# Patient Record
Sex: Male | Born: 1975
Health system: Southern US, Community
[De-identification: ages and names within clinical notes are randomized; demographics above are authoritative.]

## PROBLEM LIST (undated history)

## (undated) DIAGNOSIS — H269 Unspecified cataract: Secondary | ICD-10-CM

## (undated) DIAGNOSIS — Z87442 Personal history of urinary calculi: Secondary | ICD-10-CM

## (undated) DIAGNOSIS — R112 Nausea with vomiting, unspecified: Secondary | ICD-10-CM

## (undated) DIAGNOSIS — J309 Allergic rhinitis, unspecified: Secondary | ICD-10-CM

## (undated) DIAGNOSIS — N189 Chronic kidney disease, unspecified: Secondary | ICD-10-CM

## (undated) DIAGNOSIS — T7840XA Allergy, unspecified, initial encounter: Secondary | ICD-10-CM

## (undated) DIAGNOSIS — J45909 Unspecified asthma, uncomplicated: Secondary | ICD-10-CM

## (undated) DIAGNOSIS — N209 Urinary calculus, unspecified: Secondary | ICD-10-CM

## (undated) DIAGNOSIS — F909 Attention-deficit hyperactivity disorder, unspecified type: Secondary | ICD-10-CM

## (undated) DIAGNOSIS — I10 Essential (primary) hypertension: Secondary | ICD-10-CM

## (undated) DIAGNOSIS — Z9889 Other specified postprocedural states: Secondary | ICD-10-CM

## (undated) HISTORY — DX: Allergy, unspecified, initial encounter: T78.40XA

## (undated) HISTORY — PX: TYMPANOSTOMY TUBE PLACEMENT: SHX32

## (undated) HISTORY — DX: Urinary calculus, unspecified: N20.9

## (undated) HISTORY — DX: Attention-deficit hyperactivity disorder, unspecified type: F90.9

## (undated) HISTORY — DX: Other specified postprocedural states: R11.2

## (undated) HISTORY — DX: Allergic rhinitis, unspecified: J30.9

## (undated) HISTORY — PX: TONSILLECTOMY: SHX5217

## (undated) HISTORY — PX: OTHER SURGICAL HISTORY: SHX169

## (undated) HISTORY — DX: Personal history of urinary calculi: Z87.442

## (undated) HISTORY — DX: Unspecified asthma, uncomplicated: J45.909

## (undated) HISTORY — DX: Other specified postprocedural states: Z98.890

## (undated) HISTORY — DX: Unspecified cataract: H26.9

## (undated) HISTORY — DX: Chronic kidney disease, unspecified: N18.9

---

## 2001-12-20 ENCOUNTER — Emergency Department (HOSPITAL_COMMUNITY): Admission: EM | Admit: 2001-12-20 | Discharge: 2001-12-20 | Payer: Self-pay | Admitting: Emergency Medicine

## 2006-01-16 ENCOUNTER — Ambulatory Visit: Payer: Self-pay | Admitting: Internal Medicine

## 2006-02-21 ENCOUNTER — Ambulatory Visit: Payer: Self-pay | Admitting: Internal Medicine

## 2006-03-20 ENCOUNTER — Ambulatory Visit: Payer: Self-pay | Admitting: Internal Medicine

## 2007-09-17 ENCOUNTER — Telehealth: Payer: Self-pay | Admitting: Family Medicine

## 2007-09-17 ENCOUNTER — Ambulatory Visit: Payer: Self-pay | Admitting: Internal Medicine

## 2007-09-17 DIAGNOSIS — J309 Allergic rhinitis, unspecified: Secondary | ICD-10-CM | POA: Insufficient documentation

## 2007-09-17 DIAGNOSIS — M549 Dorsalgia, unspecified: Secondary | ICD-10-CM | POA: Insufficient documentation

## 2007-09-17 DIAGNOSIS — N209 Urinary calculus, unspecified: Secondary | ICD-10-CM | POA: Insufficient documentation

## 2007-09-17 HISTORY — DX: Urinary calculus, unspecified: N20.9

## 2007-09-17 HISTORY — DX: Allergic rhinitis, unspecified: J30.9

## 2007-09-17 LAB — CONVERTED CEMR LAB
Glucose, Urine, Semiquant: NEGATIVE
Nitrite: NEGATIVE
Specific Gravity, Urine: >=1.03
Urobilinogen, UA: 0.2
WBC Urine, dipstick: NEGATIVE
pH: 5.5

## 2007-09-22 ENCOUNTER — Encounter: Payer: Self-pay | Admitting: Internal Medicine

## 2007-10-20 ENCOUNTER — Ambulatory Visit: Payer: Self-pay | Admitting: Internal Medicine

## 2007-10-20 DIAGNOSIS — Z87442 Personal history of urinary calculi: Secondary | ICD-10-CM

## 2007-10-20 HISTORY — DX: Personal history of urinary calculi: Z87.442

## 2007-10-20 LAB — CONVERTED CEMR LAB
ALT: 32 units/L (ref 0–53)
AST: 27 units/L (ref 0–37)
Albumin: 4.7 g/dL (ref 3.5–5.2)
Alkaline Phosphatase: 58 units/L (ref 39–117)
BUN: 13 mg/dL (ref 6–23)
Bilirubin, Direct: 0.1 mg/dL (ref 0.0–0.3)
CO2: 32 meq/L (ref 19–32)
Calcium: 10 mg/dL (ref 8.4–10.5)
Chloride: 104 meq/L (ref 96–112)
Creatinine, Ser: 1.1 mg/dL (ref 0.4–1.5)
GFR calc Af Amer: 99 mL/min
GFR calc non Af Amer: 81 mL/min
Glucose, Bld: 96 mg/dL (ref 70–99)
Potassium: 4.5 meq/L (ref 3.5–5.1)
Sodium: 143 meq/L (ref 135–145)
Total Bilirubin: 0.8 mg/dL (ref 0.3–1.2)
Total Protein: 7.7 g/dL (ref 6.0–8.3)

## 2007-11-13 ENCOUNTER — Ambulatory Visit: Payer: Self-pay | Admitting: Internal Medicine

## 2007-11-13 LAB — CONVERTED CEMR LAB
Cholesterol: 189 mg/dL (ref 0–200)
HDL: 31 mg/dL — ABNORMAL LOW (ref >39.0)
LDL Cholesterol: 130 mg/dL — ABNORMAL HIGH (ref 0–99)
Total CHOL/HDL Ratio: 6.1
Triglycerides: 138 mg/dL (ref 0–149)
VLDL: 28 mg/dL (ref 0–40)

## 2008-01-11 ENCOUNTER — Telehealth (INDEPENDENT_AMBULATORY_CARE_PROVIDER_SITE_OTHER): Payer: Self-pay | Admitting: *Deleted

## 2008-01-27 ENCOUNTER — Encounter: Payer: Self-pay | Admitting: Internal Medicine

## 2008-06-27 ENCOUNTER — Telehealth: Payer: Self-pay | Admitting: Family Medicine

## 2008-06-27 ENCOUNTER — Telehealth: Payer: Self-pay | Admitting: Internal Medicine

## 2008-08-16 ENCOUNTER — Ambulatory Visit: Payer: Self-pay | Admitting: Family Medicine

## 2009-05-16 ENCOUNTER — Ambulatory Visit: Payer: Self-pay | Admitting: Internal Medicine

## 2009-05-18 ENCOUNTER — Telehealth: Payer: Self-pay | Admitting: Internal Medicine

## 2009-05-19 ENCOUNTER — Telehealth: Payer: Self-pay | Admitting: Internal Medicine

## 2009-05-19 ENCOUNTER — Ambulatory Visit: Payer: Self-pay | Admitting: Internal Medicine

## 2010-02-13 NOTE — Progress Notes (Signed)
Summary: Wants referral to Urologist  Phone Note Call from Patient Call back at Home Phone [phone]   Caller: Patient Call For: [name]  MD Summary of Call: Adam Terry to Urgent Care on the 24th for kidney pain.  Had a CT scan and he had a kidney stone.  The urgent care gave him antibiotics and by the 26th he was not having anymore pain.  He thinks the kidney stone has passed but wants to be referred to a urologist since it is a recurring thing.  Can he be referred to an urologist for reoccurring kidney stones? Initial call taken by: Barnie Mort,  January 11, 2008 9:17 AM  Follow-up for Phone Call        ok Follow-up by: Roderick Pee MD,  January 11, 2008 9:44 AM  Additional Follow-up for Phone Call Additional follow up Details #1::        Referral Appointment Information Day/Date:01/27/2008 Time:10:45 Place/MD:Dr. Retta Diones Address:[address] Phone/Fax:[phone] Patient aware  Additional Follow-up by: Florentina Addison,  January 11, 2008 3:38 PM

## 2010-02-13 NOTE — Assessment & Plan Note (Signed)
Summary: cpx/pt will come in fasting/njr   Vital Signs:  Patient Profile:   35 Years Old Male Weight:      217 pounds Temp:     98.2 degrees F Pulse rate:   92 / minute Pulse rhythm:   regular BP sitting:   148 / 100  (left arm) Cuff size:   regular  Vitals Entered By: Raechel Ache, RN (November 13, 2007 9:37 AM)                 Chief Complaint:  CPX.Marland Kitchen  History of Present Illness: 35 year old patient seen today for a wellness exam.  Medical problems include nephrolithiasis allergic rhinitis and ADHD.  He has been quite stable.  At the present time.  He is not going to school and does not use Adderall regularly.  He has some occasional back and neck discomfort which responds well to stretching and massage therapy    Current Allergies: ! SULFAMETHOXAZOLE (SULFAMETHOXAZOLE) ! PENICILLIN V POTASSIUM (PENICILLIN V POTASSIUM)  Past Medical History:    Reviewed history from 10/20/2007 and no changes required:       Allergic rhinitis       calcium oxalate kidney stones       Nephrolithiasis, hx of       ADHD  Past Surgical History:    Reviewed history and no changes required:       Tonsillectomy   Family History:    Mother - kidney stone    both parents good health.  Maternal grandmother history of breast cancer and    paternal grandmother history of MI, age 72    one uncle died of complications of leukemia  Social History:    Never Smoked    Alcohol use-no    employed in the [employer], [employer]    Review of Systems  The patient denies anorexia, fever, weight loss, weight gain, vision loss, decreased hearing, hoarseness, chest pain, syncope, dyspnea on exertion, peripheral edema, prolonged cough, headaches, hemoptysis, abdominal pain, melena, hematochezia, severe indigestion/heartburn, hematuria, incontinence, genital sores, muscle weakness, suspicious skin lesions, transient blindness, difficulty walking, depression, unusual weight change, abnormal  bleeding, enlarged lymph nodes, angioedema, breast masses, and testicular masses.     Physical Exam  General:     overweight-appearing.  repeat blood pressure 122/82 Head:     Normocephalic and atraumatic without obvious abnormalities. No apparent alopecia or balding. Eyes:     No corneal or conjunctival inflammation noted. EOMI. Perrla. Funduscopic exam benign, without hemorrhages, exudates or papilledema. Vision grossly normal. Ears:     right TM was some chronic scarring Nose:     External nasal examination shows no deformity or inflammation. Nasal mucosa are pink and moist without lesions or exudates. Mouth:     Oral mucosa and oropharynx without lesions or exudates.  Teeth in good repair. Neck:     No deformities, masses, or tenderness noted. Chest Wall:     No deformities, masses, tenderness or gynecomastia noted. Breasts:     No masses or gynecomastia noted Lungs:     Normal respiratory effort, chest expands symmetrically. Lungs are clear to auscultation, no crackles or wheezes. Heart:     Normal rate and regular rhythm. S1 and S2 normal without gallop, murmur, click, rub or other extra sounds. Abdomen:     Bowel sounds positive,abdomen soft and non-tender without masses, organomegaly or hernias noted. Genitalia:     Testes bilaterally descended without nodularity, tenderness or masses. No scrotal masses  or lesions. No penis lesions or urethral discharge. Msk:     No deformity or scoliosis noted of thoracic or lumbar spine.   Pulses:     R and L carotid,radial,femoral,dorsalis pedis and posterior tibial pulses are full and equal bilaterally Extremities:     No clubbing, cyanosis, edema, or deformity noted with normal full range of motion of all joints.   Neurologic:     No cranial nerve deficits noted. Station and gait are normal. Plantar reflexes are down-going bilaterally. DTRs are symmetrical throughout. Sensory, motor and coordinative functions appear intact. Skin:      Intact without suspicious lesions or rashes Cervical Nodes:     No lymphadenopathy noted Axillary Nodes:     No palpable lymphadenopathy Inguinal Nodes:     No significant adenopathy Psych:     Cognition and judgment appear intact. Alert and cooperative with normal attention span and concentration. No apparent delusions, illusions, hallucinations    Impression & Recommendations:  Problem # 1:  PREVENTIVE HEALTH CARE (ICD-V70.0)  Orders: Venipuncture (16109) TLB-Lipid Panel (80061-LIPID)   Complete Medication List: 1)  Hydrocodone-acetaminophen 10-325 Mg Tabs (Hydrocodone-acetaminophen) .... Take 1-2 tabs every 4-6 hours as needed for pain 2)  Promethazine Hcl 12.5 Mg Tabs (Promethazine hcl) .... Take one ot two tablets every six hours as needed for nausea 3)  Adderall Xr 20 Mg Xr24h-cap (Amphetamine-dextroamphetamine) .... One daily 4)  Xyzal 5 Mg Tabs (Levocetirizine dihydrochloride) .... One daily   Patient Instructions: 1)  Please schedule a follow-up appointment as needed. 2)  It is important that you exercise regularly at least 20 minutes 5 times a week. If you develop chest pain, have severe difficulty breathing, or feel very tired , stop exercising immediately and seek medical attention. 3)  You need to lose weight. Consider a lower calorie diet and regular exercise.    Prescriptions: XYZAL 5 MG TABS (LEVOCETIRIZINE DIHYDROCHLORIDE) one daily  #90 x 6   Entered and Authorized by:   [name]  MD   Signed by:   [name]  MD on 11/13/2007   Method used:   Print then Give to Patient   RxID:   [id number]  ]

## 2010-02-13 NOTE — Progress Notes (Signed)
  Phone Note Call from Patient   Details for Reason: On Call Note Summary of Call: Time 6:30 AM 09/17/07 Awoke this AM with severe flank pain. No hispry of kidney stones. Does have family history of sones. Some nausea, no vomiting, no fever.  Recommended ER eval given severe pain, cannot wait till office opens.  Initial call taken by: Kerby Nora MD,  September 17, 2007 5:32 PM

## 2010-02-13 NOTE — Letter (Signed)
Summary: [facility]  [facility]   Imported By: Maryln Gottron 02/02/2008 15:45:10  _____________________________________________________________________  External Attachment:    Type:   Image     Comment:   External Document

## 2010-02-13 NOTE — Progress Notes (Signed)
Summary: back pain  Phone Note Call from Patient   Caller: Patient Call For: [name]  MD Summary of Call: Pt's back is no better.  Pain meds are not helping.  Is asking for recommendations. [phone] Initial call taken by: Lynann Beaver CMA,  May 18, 2009 9:51 AM  Follow-up for Phone Call        Pt is calling back to see if he should come back in to the office today? Follow-up by: Lynann Beaver CMA,  May 18, 2009 12:21 PM  Additional Follow-up for Phone Call Additional follow up Details #1::        Rx generic vicodin 5/500  #50 one or two every 6  hrs for pain; ROV tomarrow if unimproved Additional Follow-up by: [name]  MD,  May 18, 2009 12:54 PM    Additional Follow-up for Phone Call Additional follow up Details #2::    [facility] [place]  New/Updated Medications: VICODIN 5-500 MG TABS (HYDROCODONE-ACETAMINOPHEN) 1-2 q 4 hours as needed pain Prescriptions: VICODIN 5-500 MG TABS (HYDROCODONE-ACETAMINOPHEN) 1-2 q 4 hours as needed pain  #60 x 0   Entered by:   Lynann Beaver CMA   Authorized by:   [name]  MD   Signed by:   Lynann Beaver CMA on 05/18/2009   Method used:   Telephoned to ...       [facility] [address] (retail)       [address]       [place], [place]  [address]       Ph: [phone]       Fax: [phone]   RxID:   [id number]  Pt. notified.

## 2010-02-13 NOTE — Assessment & Plan Note (Signed)
Summary: increased back pain today./dm   Vital Signs:  Patient profile:   35 year old male Weight:      226 pounds Temp:     98.1 degrees F BP sitting:   110 / 80  (left arm) Cuff size:   regular  Vitals Entered By: Duard Brady LPN (May 19, 1608 3:27 PM) CC: c/o increased back pain, vicodin not really helping   CC:  c/o increased back pain and vicodin not really helping.  History of Present Illness: 35 year old patient who is seen today for follow-up of his right mid back pain.  He is modestly improved still in considerable pain and spasm with movement.  He is on hydrocodone and naproxen.  Denies any fever or constitutional complaints.  Pain is aggravated by twisting and bending and alleviated by rest.  No history of chronic back pain  Allergies: 1)  ! Sulfamethoxazole (Sulfamethoxazole) 2)  ! Penicillin V Potassium (Penicillin V Potassium)  Past History:  Past Medical History: Reviewed history from 11/13/2007 and no changes required. Allergic rhinitis calcium oxalate kidney stones Nephrolithiasis, hx of ADHD  Physical Exam  General:  Well-developed,well-nourished,in no acute distress; alert,appropriate and cooperative throughout examination Msk:  slight tenderness to the right of the midline in the lower thoracic region.  These muscles were tender and slightly tense.  No tenderness over the spinous processes   Impression & Recommendations:  Problem # 1:  BACK PAIN (ICD-724.5)  His updated medication list for this problem includes:    Vicodin 5-500 Mg Tabs (Hydrocodone-acetaminophen) .Marland Kitchen... 1-2 q 4 hours as needed pain    Cyclobenzaprine Hcl 10 Mg Tabs (Cyclobenzaprine hcl) ..... One every 8 hours for back pain  His updated medication list for this problem includes:    Vicodin 5-500 Mg Tabs (Hydrocodone-acetaminophen) .Marland Kitchen... 1-2 q 4 hours as needed pain    Cyclobenzaprine Hcl 10 Mg Tabs (Cyclobenzaprine hcl) ..... One every 8 hours for back pain  Complete  Medication List: 1)  Xyzal 5 Mg Tabs (Levocetirizine dihydrochloride) .... One daily 2)  Vicodin 5-500 Mg Tabs (Hydrocodone-acetaminophen) .Marland Kitchen.. 1-2 q 4 hours as needed pain 3)  Cyclobenzaprine Hcl 10 Mg Tabs (Cyclobenzaprine hcl) .... One every 8 hours for back pain  Patient Instructions: 1)  Most patients (90%) with low back pain will improve with time (2-6 weeks). Keep active but avoid activities that are painful. Apply moist heat and/or ice to lower back several times a day. Prescriptions: CYCLOBENZAPRINE HCL 10 MG TABS (CYCLOBENZAPRINE HCL) one every 8 hours for back pain  #30 x 0   Entered and Authorized by:   [name]  MD   Signed by:   [name]  MD on 05/19/2009   Method used:   Print then Give to Patient   RxID:   [id number] VICODIN 5-500 MG TABS (HYDROCODONE-ACETAMINOPHEN) 1-2 q 4 hours as needed pain  #60 x 0   Entered and Authorized by:   [name]  MD   Signed by:   [name]  MD on 05/19/2009   Method used:   Print then Give to Patient   RxID:   [id number]   Appended Document: increased back pain today./dm      Allergies: 1)  ! Sulfamethoxazole (Sulfamethoxazole) 2)  ! Penicillin V Potassium (Penicillin V Potassium)   Complete Medication List: 1)  Xyzal 5 Mg Tabs (Levocetirizine dihydrochloride) .... One daily 2)  Vicodin 5-500 Mg Tabs (Hydrocodone-acetaminophen) .Marland Kitchen.. 1-2 q 4 hours as  needed pain 3)  Cyclobenzaprine Hcl 10 Mg Tabs (Cyclobenzaprine hcl) .... One every 8 hours for back pain  Other Orders: Depo- Medrol 80mg  (J1040) Admin of Therapeutic Inj  intramuscular or subcutaneous (04540)    Medication Administration  Injection # 1:    Medication: Depo- Medrol 80mg     Diagnosis: BACK PAIN (ICD-724.5)    Route: IM    Site: LUOQ gluteus    Exp Date: 11/2011    Lot #: [id number]    Mfr: [employer]    Patient tolerated injection without complications    Given by: Duard Brady LPN (May 19, 9809 4:41  PM)  Orders Added: 1)  Depo- Medrol 80mg  [J1040] 2)  Admin of Therapeutic Inj  intramuscular or subcutaneous [91478]

## 2010-02-13 NOTE — Progress Notes (Signed)
Summary: after hours  Phone Note Call from Patient   Caller: Patient Summary of Call: pt had called and asked for script from PCP for HA pain, was ok'd but never called in.  will send to pharmacy for pt.  pt denies red flags- nausea, dizziness, visual changes, focal weakness.  told pt if any red flags presented to go to [facility] or ED.  if no improvement after Tramadol should schedule appt w/ PCP.  Pt expresses understanding and is in agreement w/ this plan. Initial call taken by: Neena Rhymes MD,  June 27, 2008 5:49 PM    New/Updated Medications: TRAMADOL HCL 50 MG  TABS (TRAMADOL HCL) 2 tabs initially and then 1 Q6 as needed for pain.   Prescriptions: TRAMADOL HCL 50 MG  TABS (TRAMADOL HCL) 2 tabs initially and then 1 Q6 as needed for pain.  #50 x 0   Entered and Authorized by:   Neena Rhymes MD   Signed by:   Neena Rhymes MD on 06/27/2008   Method used:   Electronically to        [facility] [address] (retail)       [address]       [place], [place]  [address]       Ph: [phone]       Fax: [phone]   RxID:   [id number]

## 2010-02-13 NOTE — Assessment & Plan Note (Signed)
Summary: fu on kidney stones/njr   Vital Signs:  Patient Profile:   35 Years Old Male Weight:      214 pounds Temp:     98.3 degrees F oral BP sitting:   122 / 80  (left arm) Cuff size:   regular  Vitals Entered By: Raechel Ache, RN (October 20, 2007 2:14 PM)                 Chief Complaint:  F/u kidney stone- R side still sore.Marland Kitchen  History of Present Illness: 35 year old patient with recent diagnosed as having calcium oxalate kidney stones.  He had an episode of severe colic, slightly scaly and passed the stone that was analyzed.  He continues to have some mild right upper quadrant discomfort.  Otherwise he is doing well.  His mother also has kidney stones    Current Allergies: ! SULFAMETHOXAZOLE (SULFAMETHOXAZOLE) ! PENICILLIN V POTASSIUM (PENICILLIN V POTASSIUM)  Past Medical History:    Allergic rhinitis    calcium oxalate kidney stones    Nephrolithiasis, hx of      Physical Exam  General:     overweight-appearing.   Abdomen:     Bowel sounds positive,abdomen soft and non-tender without masses, organomegaly or hernias noted.    Impression & Recommendations:  Problem # 1:  NEPHROLITHIASIS, HX OF (ICD-V13.01)  Orders: Venipuncture (54270) TLB-BMP (Basic Metabolic Panel-BMET) (80048-METABOL) TLB-Hepatic/Liver Function Pnl (80076-HEPATIC) T-1 View Abdomen (KUB) (74000TC)   Problem # 2:  ALLERGIC RHINITIS (ICD-477.9)  His updated medication list for this problem includes:    Promethazine Hcl 12.5 Mg Tabs (Promethazine hcl) .Marland Kitchen... Take one ot two tablets every six hours as needed for nausea   Complete Medication List: 1)  No Medications  2)  Hydrocodone-acetaminophen 10-325 Mg Tabs (Hydrocodone-acetaminophen) .... Take 1-2 tabs every 4-6 hours as needed for pain 3)  Promethazine Hcl 12.5 Mg Tabs (Promethazine hcl) .... Take one ot two tablets every six hours as needed for nausea   Patient Instructions: 1)  Please schedule a follow-up appointment as  needed.   ]

## 2010-02-13 NOTE — Assessment & Plan Note (Signed)
Summary: BACK INJURY/PS   Vital Signs:  Patient profile:   35 year old male Weight:      225 pounds Temp:     98.1 degrees F oral BP sitting:   138 / 90  (right arm) Cuff size:   regular  Vitals Entered By: Duard Brady LPN (May 17, 6043 10:04 AM) CC: lower back pain  injuried while picking up a child   CC:  lower back pain  injuried while picking up a child.  History of Present Illness: 84 year old patient has had some minor back stiffness for a period of time.  Three days ago, he  states a pickup up a  young child and had the onset of sharp mid back pain, just to the right of the midline.  He describes some minor discomfort, but has not improved.  He does have a history of nephrolithiasis and he was a bit concerned about possible kidney stone. He has a history of allergic rhinitis, which has been quite stable.  He uses antihistamines only with yard work and  in general, does quite well.  Preventive Screening-Counseling & Management  Alcohol-Tobacco     Smoking Status: never  Allergies: 1)  ! Sulfamethoxazole (Sulfamethoxazole) 2)  ! Penicillin V Potassium (Penicillin V Potassium)  Past History:  Past Medical History: Reviewed history from 11/13/2007 and no changes required. Allergic rhinitis calcium oxalate kidney stones Nephrolithiasis, hx of ADHD  Review of Systems  The patient denies anorexia, fever, weight loss, weight gain, vision loss, decreased hearing, hoarseness, chest pain, syncope, dyspnea on exertion, peripheral edema, prolonged cough, headaches, hemoptysis, abdominal pain, melena, hematochezia, severe indigestion/heartburn, hematuria, incontinence, genital sores, muscle weakness, suspicious skin lesions, transient blindness, difficulty walking, depression, unusual weight change, abnormal bleeding, enlarged lymph nodes, angioedema, breast masses, and testicular masses.    Physical Exam  General:  overweight-appearing.  normal blood pressure down to  120/80 Msk:  examination of  the mid back area revealed some mild tenderness just to the right of the midline at the low thoracic area   Impression & Recommendations:  Problem # 1:  BACK PAIN (ICD-724.5)  Problem # 2:  ALLERGIC RHINITIS (ICD-477.9)  His updated medication list for this problem includes:    Xyzal 5 Mg Tabs (Levocetirizine dihydrochloride) ..... One daily  Complete Medication List: 1)  Xyzal 5 Mg Tabs (Levocetirizine dihydrochloride) .... One daily  Patient Instructions: 1)  Most patients (90%) with low back pain will improve with time (2-6 days). Keep active but avoid activities that are painful. Apply moist heat and/or ice to lower back several times a day. 2)  VIMOVO ONE TWICE DAILY

## 2010-02-13 NOTE — Assessment & Plan Note (Signed)
Summary: sharp pain rt side of back/RCD   Vital Signs:  Patient Profile:   35 Years Old Male Pulse rate:   64 / minute BP sitting:   120 / 80  (left arm)  Vitals Entered By: Gladis Riffle, RN (September 17, 2007 8:10 AM)                 Chief Complaint:  c/o sharp pain right flank since 6 AM today and intensified 7AM--also cold sweats.  History of Present Illness: 35 yo previously healthy male presents with R flank pain that woke him at 06:00 this morning.  He says the pain is stabbing and radiates towars his RLQ and R testicle.  He describes the pain as "the worst pain I have ever felt."  He endorses cold chills and nausea.  He experienced some occasional dysuria and slight increase in frequency yesterday.  He has never had this pain before.  He has never had a kidney stone or any abdominal surgery.  Family history of kidney stones.      Current Allergies (reviewed today): ! SULFAMETHOXAZOLE (SULFAMETHOXAZOLE) ! PENICILLIN V POTASSIUM (PENICILLIN V POTASSIUM)  Past Medical History:    Allergic rhinitis   Family History:    Mother - kidney stone  Social History:    Never Smoked    Alcohol use-no   Risk Factors:  Tobacco use:  never Alcohol use:  no   Review of Systems       Rreview of systems negative except per HPI.  (12 point ROS)   Physical Exam  General:     standing and leaning forward from the pain, able to speak in full sentances Head:     normocephalic and atraumatic.   Eyes:     pupils equal, pupils round, and pupils reactive to light.   Ears:     R ear normal and L ear normal.   Nose:     no external deformity, no external erythema, and no nasal discharge.   Mouth:     good dentition, pharynx pink and moist, no erythema, and no exudates, moist mucus membranes  Neck:     supple, full ROM, and no masses.   Chest Wall:     no deformities, no tenderness, and no masses.   Lungs:     normal respiratory effort, no intercostal retractions, no  accessory muscle use, normal breath sounds, no dullness, no crackles, and no wheezes.   Heart:     normal rate, regular rhythm, no murmur, no gallop, no rub, and no JVD.   Abdomen:     soft, normal bowel sounds, no distention, no masses, no guarding, no rigidity, no rebound tenderness, no inguinal hernia, and no hepatomegaly, negative Murphy sign, no rovsing, positive CVA tenderness on right side Genitalia:     no testicular tenderness, bilateral cremasteric reflex Msk:     normal ROM, no joint tenderness, no joint swelling, no joint warmth, and no redness over joints.   Pulses:     R radial normal and L radial normal.   Extremities:     no edema Neurologic:     alert & oriented X3 and strength normal in all extremities.   Skin:     turgor normal, color normal, no rashes, no suspicious lesions, no ecchymoses, no petechiae, no purpura, no ulcerations, and no edema.   Cervical Nodes:     no anterior cervical adenopathy and no posterior cervical adenopathy.   Psych:     Oriented  X3, memory intact for recent and remote, good eye contact, and not depressed appearing.      Impression & Recommendations:  Problem # 1:  STONE, URINARY CALCULUS,UNSPEC. (ICD-592.9) 35 yo previously healthy male with right flank pain that started this morning.  Feel symptoms are most consistent with kidney stone.  The patient does endorse some dysuria and frequency yesterday.  He has chills with the pain but no fever.  Feel pylonephritis is unlikely.  Concern is that the patient could have a UTI and a kidney stone, which would be a serious condition.  Will check a urine.  Feel abdominal pathology such as testicular torsion, appendicitis, cholecystitis, pancreatitis, obstruction, gastroenteritis are much less likely given the soft abdomen and the pain being located in the R flank.  Will give pain medication and phenergan now.  Urine has 3+ blood and no nitrites or leukocytes.  Feel this is very consistent with kidney  stone.  No fever or nitirites to suggest infection.  Have given patient phenergan, Vicodin, a strainer, and have instructed the patient to drink plenty of fluids and to call tomorrow if his stone has not passed.   Problem # 2:  BACK PAIN (ICD-724.5)  His updated medication list for this problem includes:    Hydrocodone-acetaminophen 10-325 Mg Tabs (Hydrocodone-acetaminophen) .Marland Kitchen... Take 1-2 tabs every 4-6 hours as needed for pain  Orders: Demerol / Phenergan Injection (Z6109) Admin of Therapeutic Inj  intramuscular or subcutaneous (60454) UA Dipstick w/o Micro (automated)  (81003)   Complete Medication List: 1)  No Medications  2)  Hydrocodone-acetaminophen 10-325 Mg Tabs (Hydrocodone-acetaminophen) .... Take 1-2 tabs every 4-6 hours as needed for pain 3)  Promethazine Hcl 12.5 Mg Tabs (Promethazine hcl) .... Take one ot two tablets every six hours as needed for nausea    Prescriptions: PROMETHAZINE HCL 12.5 MG TABS (PROMETHAZINE HCL) Take one ot two tablets every six hours as needed for nausea  #30 x 0   Entered by:   Marlana Salvage MD   Authorized by:   Birdie Sons MD   Signed by:   Marlana Salvage MD on 09/17/2007   Method used:   Print then Give to Patient   RxID:   [id number] HYDROCODONE-ACETAMINOPHEN 10-325 MG TABS (HYDROCODONE-ACETAMINOPHEN) Take 1-2 tabs every 4-6 hours as needed for pain  #30 x 0   Entered by:   Marlana Salvage MD   Authorized by:   Birdie Sons MD   Signed by:   Marlana Salvage MD on 09/17/2007   Method used:   Print then Give to Patient   RxID:   [id number]  ]  Medication Administration  Injection # 1:    Medication: Demerol / Phenergan Injection    Diagnosis: BACK PAIN (ICD-724.5)    Route: IM    Site: RUOQ gluteus    Exp Date: 07/14/2008    Lot #: [id number]    Mfr: hospira    Comments: given demerol 50mg  and phenrgan 25mg  phenergan exp:06/2008  NUU:725366   manufac:Baxter    Patient tolerated injection without complications     Given by: Gladis Riffle, RN (September 17, 2007 9:24 AM)  Orders Added: 1)  Demerol / Phenergan Injection [J2180] 2)  Admin of Therapeutic Inj  intramuscular or subcutaneous [96372] 3)  UA Dipstick w/o Micro (automated)  [81003] 4)  Est. Patient Level IV [44034]  Laboratory Results   Urine Tests   Date/Time Reported: September 17, 2007 9:59 AM   Routine Urinalysis   Color: yellow Appearance:  Clear Glucose: negative   (Normal Range: Negative) Bilirubin: 1+   (Normal Range: Negative) Ketone: 1+   (Normal Range: Negative) Spec. Gravity: >=1.030   (Normal Range: 1.003-1.035) Blood: 3+   (Normal Range: Negative) pH: 5.5   (Normal Range: 5.0-8.0) Protein: 1+   (Normal Range: Negative) Urobilinogen: 0.2   (Normal Range: 0-1) Nitrite: negative   (Normal Range: Negative) Leukocyte Esterace: negative   (Normal Range: Negative)    Comments: Wynona Canes, CMA  September 17, 2007 9:59 AM

## 2010-02-13 NOTE — Progress Notes (Signed)
Summary: update  Phone Note Call from Patient   Caller: Patient Call For: [name]  MD Summary of Call: Calling back with update and to thank [place]. [phone] Initial call taken by: Lynann Beaver CMA,  May 19, 2009 9:49 AM  Follow-up for Phone Call        Pt is doing better, but back still has muscle spasms taking the pain meds.  Pain is from 8 to 6.  Anything else he needs to do?  [phone] Follow-up by: Lynann Beaver CMA,  May 19, 2009 9:59 AM  Additional Follow-up for Phone Call Additional follow up Details #1::        continue same as long as he is improving- call for additional pain meds if needed Additional Follow-up by: [name]  MD,  May 19, 2009 10:08 AM     Appended Document: update Pt notified.

## 2010-02-13 NOTE — Progress Notes (Signed)
Summary: headache  Phone Note Call from Patient   Caller: Patient Call For: [name]  MD Summary of Call: Pt has had a headache all day unrelieved by Excedrin migraine, and would like Dr.K to call Rx in to [facility] ([facility] [place] tonight, please. [phone] Initial call taken by: Lynann Beaver CMA,  June 27, 2008 4:53 PM    Tramadol 50 mg, take two initially, then one every 6 hours as needed for pain  #50

## 2010-02-13 NOTE — Assessment & Plan Note (Signed)
Summary: dry cough/scratchy throat/cjr   Vital Signs:  Patient profile:   35 year old male Height:      70 inches Weight:      217 pounds BMI:     31.25 Temp:     98.7 degrees F oral Pulse rate:   80 / minute Resp:     14 per minute BP sitting:   140 / 84  (left arm)  Vitals Entered By: Willy Eddy, LPN (August 16, 2008 4:11 PM)  CC:  c/o non produc tive cough >1 month.  History of Present Illness: 35 year old patient with a history of allergic rhinitis, who presents with a one-month history of dry, nonproductive cough.  No fever or sputum production.  Denies any shortness of breath.  No obvious new irritants although he and his wife has recently moved into his parents home.  However, symptoms predated this moved  Allergies: 1)  ! Sulfamethoxazole (Sulfamethoxazole) 2)  ! Penicillin V Potassium (Penicillin V Potassium)  Past History:  Past Medical History: Reviewed history from 11/13/2007 and no changes required. Allergic rhinitis calcium oxalate kidney stones Nephrolithiasis, hx of ADHD  Physical Exam  General:  overweight-appearing.  normal blood pressure Head:  Normocephalic and atraumatic without obvious abnormalities. No apparent alopecia or balding. Eyes:  No corneal or conjunctival inflammation noted. EOMI. Perrla. Funduscopic exam benign, without hemorrhages, exudates or papilledema. Vision grossly normal. Ears:  External ear exam shows no significant lesions or deformities.  Otoscopic examination reveals clear canals, tympanic membranes are intact bilaterally without bulging, retraction, inflammation or discharge. Hearing is grossly normal bilaterally. Nose:  External nasal examination shows no deformity or inflammation. Nasal mucosa are pink and moist without lesions or exudates. Mouth:  Oral mucosa and oropharynx without lesions or exudates.  Teeth in good repair. Neck:  No deformities, masses, or tenderness noted. Lungs:  Normal respiratory effort, chest  expands symmetrically. Lungs are clear to auscultation, no crackles or wheezes. O2 saturation 97% Heart:  Normal rate and regular rhythm. S1 and S2 normal without gallop, murmur, click, rub or other extra sounds.   Impression & Recommendations:  Problem # 1:  COUGH (ICD-786.2) may be a manifestation of bronchospastic disease; will get the patient a trial of nasal and inhalational steroids.  Continue antihistamines and observe;  will call if unimproved  Problem # 2:  ALLERGIC RHINITIS (ICD-477.9)  The following medications were removed from the medication list:    Promethazine Hcl 12.5 Mg Tabs (Promethazine hcl) .Marland Kitchen... Take one ot two tablets every six hours as needed for nausea His updated medication list for this problem includes:    Xyzal 5 Mg Tabs (Levocetirizine dihydrochloride) ..... One daily  Complete Medication List: 1)  Xyzal 5 Mg Tabs (Levocetirizine dihydrochloride) .... One daily  Patient Instructions: 1)  claritin/xyzal   one daily 2)  Omnaris use daily (nose) 3)  Qvar  2 puffs daily (lungs)

## 2010-05-16 ENCOUNTER — Inpatient Hospital Stay (INDEPENDENT_AMBULATORY_CARE_PROVIDER_SITE_OTHER)
Admission: RE | Admit: 2010-05-16 | Discharge: 2010-05-16 | Disposition: A | Discharge status: Home / Self Care | Payer: BC Managed Care – PPO | Source: Ambulatory Visit | Attending: Family Medicine | Admitting: Family Medicine

## 2010-05-16 DIAGNOSIS — R05 Cough: Secondary | ICD-10-CM

## 2010-05-16 DIAGNOSIS — R059 Cough, unspecified: Secondary | ICD-10-CM

## 2010-05-16 DIAGNOSIS — J069 Acute upper respiratory infection, unspecified: Secondary | ICD-10-CM

## 2010-05-16 DIAGNOSIS — J040 Acute laryngitis: Secondary | ICD-10-CM

## 2010-06-01 NOTE — Assessment & Plan Note (Signed)
[facility] OFFICE NOTE   GURMAN, ASHLAND                        MRN:          [id number]  DATE:01/16/2006                            DOB:          May 17, 1975    The patient is a 35 year old gentleman who is seen today to establish  with our practice. He has a several week history of sinus pressures,  drainage, and headaches. He does have a history of seasonal allergic  rhinitis and has seen allergy physicians in the past. He has had a  remote TNA. More recently he has been diagnosed and treated for ADHA and  has had nice response to Adderall.   ALLERGIES:  INCLUDE SULFA AND PENICILLIN   He is a nonsmoker.   REVIEW OF SYSTEMS:  Exam was negative.   SOCIAL HISTORY:  He is married, no children. 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   FAMILY HISTORY:  Both parents are in reasonably good health although his  mother is a bit overweight. His father is 45, his mother is 7. One  sister is in good health and is studying to be a radiologist. Maternal  grandmother had breast cancer. Paternal grandmother had an myocardial  infarction at 42. One uncle died of what sounds like leukemia.   PHYSICAL EXAMINATION:  Revealed a mildly overweight male, no acute  distress. Blood pressure 126/80.  Fundi, ears, nose, and throat clear.  NECK: No adenopathy or bruits.  CHEST: Clear.  CARDIOVASCULAR EXAM: Normal heart sounds, no murmurs.  ABDOMEN: Overweight, soft, nontender, no organomegaly.  EXTERNAL GENITALIA: Normal.  EXTREMITIES: Negative, full peripheral pulses.   IMPRESSION:  Seasonal allergic rhinitis. Attention deficit disorder.   DISPOSITION:  He will be resumed on the Adderall XR 20 mg daily. He will  be treated with Allegra and Nasacort AQ.     [name], MD  Electronically Signed    PFK/MedQ  DD: 01/16/2006  DT: 01/16/2006  Job #: [id number]

## 2010-08-24 ENCOUNTER — Inpatient Hospital Stay (INDEPENDENT_AMBULATORY_CARE_PROVIDER_SITE_OTHER)
Admission: RE | Admit: 2010-08-24 | Discharge: 2010-08-24 | Disposition: A | Discharge status: Home / Self Care | Payer: 59 | Source: Ambulatory Visit | Attending: Family Medicine | Admitting: Family Medicine

## 2010-08-24 DIAGNOSIS — J069 Acute upper respiratory infection, unspecified: Secondary | ICD-10-CM

## 2010-09-11 ENCOUNTER — Encounter: Payer: Self-pay | Admitting: Internal Medicine

## 2010-09-13 ENCOUNTER — Encounter: Payer: Self-pay | Admitting: Internal Medicine

## 2010-09-13 ENCOUNTER — Ambulatory Visit (INDEPENDENT_AMBULATORY_CARE_PROVIDER_SITE_OTHER): Payer: 59 | Admitting: Internal Medicine

## 2010-09-13 VITALS — BP 138/100 | Temp 97.9°F | Wt 234.0 lb

## 2010-09-13 DIAGNOSIS — F909 Attention-deficit hyperactivity disorder, unspecified type: Secondary | ICD-10-CM

## 2010-09-13 MED ORDER — AMPHETAMINE-DEXTROAMPHET ER 20 MG PO CP24: 20.0000 mg | ORAL_CAPSULE | ORAL | Status: DC

## 2010-09-13 NOTE — Patient Instructions (Signed)
Limit your sodium (Salt) intake  You need to lose weight.  Consider a lower calorie diet and regular exercise.  Please check your blood pressure on a regular basis.  If it is consistently greater than 150/90, please make an office appointment.    It is important that you exercise regularly, at least 20 minutes 3 to 4 times per week.  If you develop chest pain or shortness of breath seek  medical attention.  CPX as scheduled

## 2010-09-13 NOTE — Progress Notes (Signed)
  Subjective:    Patient ID: Adam Terry, male    DOB: 1975-10-31, 35 y.o.   MRN: [id number]  HPI 35 year old patient who is in today for followup. He has a history of ADHD but has not been on Adderall for the past 3 or 4 years. He has not been in school and not been employed but now that he is working would like to resume this medication. He states that he functions much better on the medication with improved concentration. He is tolerating the medication well. His prior dose had been Adderall XL 20 mg daily. He now works with the [employer]. He works third shift. He has a history of labile hypertension additionally home blood pressure readings are normal. Blood pressure today 140/100. He is scheduled for his annual exam in November     Review of Systems  Constitutional: Negative for fever, chills, appetite change and fatigue.  HENT: Negative for hearing loss, ear pain, congestion, sore throat, trouble swallowing, neck stiffness, dental problem, voice change and tinnitus.   Eyes: Negative for pain, discharge and visual disturbance.  Respiratory: Negative for cough, chest tightness, wheezing and stridor.   Cardiovascular: Negative for chest pain, palpitations and leg swelling.  Gastrointestinal: Negative for nausea, vomiting, abdominal pain, diarrhea, constipation, blood in stool and abdominal distention.  Genitourinary: Negative for urgency, hematuria, flank pain, discharge, difficulty urinating and genital sores.  Musculoskeletal: Negative for myalgias, back pain, joint swelling, arthralgias and gait problem.  Skin: Negative for rash.  Neurological: Negative for dizziness, syncope, speech difficulty, weakness, numbness and headaches.  Hematological: Negative for adenopathy. Does not bruise/bleed easily.  Psychiatric/Behavioral: Negative for behavioral problems and dysphoric mood. The patient is not nervous/anxious.        Objective:   Physical Exam  Constitutional: He is  oriented to person, place, and time. He appears well-developed.       Blood pressure 140/98  HENT:  Head: Normocephalic.  Right Ear: External ear normal.  Left Ear: External ear normal.  Eyes: Conjunctivae and EOM are normal.  Neck: Normal range of motion.  Cardiovascular: Normal rate and normal heart sounds.   Pulmonary/Chest: Breath sounds normal.  Abdominal: Bowel sounds are normal.  Musculoskeletal: Normal range of motion. He exhibits no edema and no tenderness.  Neurological: He is alert and oriented to person, place, and time.  Psychiatric: He has a normal mood and affect. His behavior is normal.          Assessment & Plan:  ADHD. We'll resume Adderall  XR  20 mg daily Rule out mild hypertension. Low-salt diet exercise weight loss all encouraged; we'll reassess at the time of his annual physical in November

## 2010-11-21 ENCOUNTER — Other Ambulatory Visit (INDEPENDENT_AMBULATORY_CARE_PROVIDER_SITE_OTHER): Payer: 59

## 2010-11-21 DIAGNOSIS — Z0000 Encounter for general adult medical examination without abnormal findings: Secondary | ICD-10-CM

## 2010-11-22 LAB — CBC WITH DIFFERENTIAL/PLATELET
Basophils Absolute: 0 10*3/uL (ref 0.0–0.1)
Basophils Relative: 0.3 % (ref 0.0–3.0)
Eosinophils Absolute: 0.3 10*3/uL (ref 0.0–0.7)
Eosinophils Relative: 3.4 % (ref 0.0–5.0)
HCT: 42.8 % (ref 39.0–52.0)
Hemoglobin: 14.6 g/dL (ref 13.0–17.0)
Lymphocytes Relative: 33.1 % (ref 12.0–46.0)
Lymphs Abs: 2.6 10*3/uL (ref 0.7–4.0)
MCHC: 34.2 g/dL (ref 30.0–36.0)
MCV: 93.3 fl (ref 78.0–100.0)
Monocytes Absolute: 0.7 10*3/uL (ref 0.1–1.0)
Monocytes Relative: 9.6 % (ref 3.0–12.0)
Neutro Abs: 4.1 10*3/uL (ref 1.4–7.7)
Neutrophils Relative %: 53.6 % (ref 43.0–77.0)
Platelets: 210 10*3/uL (ref 150.0–400.0)
RBC: 4.58 Mil/uL (ref 4.22–5.81)
RDW: 13.6 % (ref 11.5–14.6)
WBC: 7.7 10*3/uL (ref 4.5–10.5)

## 2010-11-22 LAB — HEPATIC FUNCTION PANEL
ALT: 39 U/L (ref 0–53)
AST: 32 U/L (ref 0–37)
Albumin: 4.5 g/dL (ref 3.5–5.2)
Alkaline Phosphatase: 61 U/L (ref 39–117)
Bilirubin, Direct: 0.1 mg/dL (ref 0.0–0.3)
Total Bilirubin: 0.3 mg/dL (ref 0.3–1.2)
Total Protein: 7.7 g/dL (ref 6.0–8.3)

## 2010-11-22 LAB — LIPID PANEL
Cholesterol: 191 mg/dL (ref 0–200)
HDL: 37.1 mg/dL — ABNORMAL LOW (ref >39.00)
LDL Cholesterol: 124 mg/dL — ABNORMAL HIGH (ref 0–99)
Total CHOL/HDL Ratio: 5
Triglycerides: 150 mg/dL — ABNORMAL HIGH (ref 0.0–149.0)
VLDL: 30 mg/dL (ref 0.0–40.0)

## 2010-11-22 LAB — BASIC METABOLIC PANEL
BUN: 14 mg/dL (ref 6–23)
CO2: 28 mEq/L (ref 19–32)
Calcium: 8.9 mg/dL (ref 8.4–10.5)
Chloride: 105 mEq/L (ref 96–112)
Creatinine, Ser: 1.1 mg/dL (ref 0.4–1.5)
GFR: 80.78 mL/min (ref >60.00)
Glucose, Bld: 89 mg/dL (ref 70–99)
Potassium: 4 mEq/L (ref 3.5–5.1)
Sodium: 143 mEq/L (ref 135–145)

## 2010-11-22 LAB — POCT URINALYSIS DIPSTICK
Blood, UA: NEGATIVE
Glucose, UA: NEGATIVE
Ketones, UA: NEGATIVE
Leukocytes, UA: NEGATIVE
Nitrite, UA: NEGATIVE
Spec Grav, UA: >=1.03
Urobilinogen, UA: 0.2
pH, UA: 5

## 2010-11-22 LAB — TSH: TSH: 1.19 u[IU]/mL (ref 0.35–5.50)

## 2010-11-28 ENCOUNTER — Encounter: Payer: 59 | Admitting: Internal Medicine

## 2010-11-28 ENCOUNTER — Encounter: Payer: Self-pay | Admitting: Internal Medicine

## 2010-11-28 ENCOUNTER — Ambulatory Visit (INDEPENDENT_AMBULATORY_CARE_PROVIDER_SITE_OTHER): Payer: 59 | Admitting: Internal Medicine

## 2010-11-28 VITALS — BP 120/72 | HR 92 | Temp 98.2°F | Resp 18 | Ht 67.75 in | Wt 226.0 lb

## 2010-11-28 DIAGNOSIS — Z0000 Encounter for general adult medical examination without abnormal findings: Secondary | ICD-10-CM

## 2010-11-28 MED ORDER — AMPHETAMINE-DEXTROAMPHET ER 20 MG PO CP24: 20.0000 mg | ORAL_CAPSULE | ORAL | Status: DC

## 2010-11-28 NOTE — Patient Instructions (Signed)
It is important that you exercise regularly, at least 20 minutes 3 to 4 times per week.  If you develop chest pain or shortness of breath seek  medical attention.  You need to lose weight.  Consider a lower calorie diet and regular exercise.  Return in one year for follow-up   

## 2010-11-28 NOTE — Progress Notes (Signed)
Subjective:    Patient ID: Adam Terry, male    DOB: 10/20/1975, 35 y.o.   MRN: [id number]  HPI   35 -year-old patient who is seen today for a preventive health examination. He does quite well he does have a history of allergic rhinitis nephrolithiasis which has been stable as well his ADHD. 3 months ago he was resumed on Adderall and has done quite well on his present dose he does work for the [employer] third shift and has done well with taking this medication once daily upon awakening. Laboratory studies were reviewed   Current Allergies:   ! SULFAMETHOXAZOLE (SULFAMETHOXAZOLE)  ! PENICILLIN V POTASSIUM (PENICILLIN V POTASSIUM)   Past Medical History:  Reviewed history from 10/20/2007 and no changes required:  Allergic rhinitis  calcium oxalate kidney stones  Nephrolithiasis, hx of  ADHD   Past Surgical History:  Reviewed history and no changes required:  Tonsillectomy   Family History:  Mother - kidney stone  both parents good health. Maternal grandmother history of breast cancer and  paternal grandmother history of MI, age 57  one uncle died of complications of leukemia   Social History:   Married works for [employer] health care Never Smoked  Alcohol use-no  employed in the [employer], molecular   Review of Systems  Constitutional: Negative for fever, chills, activity change, appetite change and fatigue.  HENT: Negative for hearing loss, ear pain, congestion, rhinorrhea, sneezing, mouth sores, trouble swallowing, neck pain, neck stiffness, dental problem, voice change, sinus pressure and tinnitus.   Eyes: Negative for photophobia, pain, redness and visual disturbance.  Respiratory: Negative for apnea, cough, choking, chest tightness, shortness of breath and wheezing.   Cardiovascular: Negative for chest pain, palpitations and leg swelling.  Gastrointestinal: Negative for nausea, vomiting, abdominal pain, diarrhea, constipation, blood in stool, abdominal  distention, anal bleeding and rectal pain.  Genitourinary: Negative for dysuria, urgency, frequency, hematuria, flank pain, decreased urine volume, discharge, penile swelling, scrotal swelling, difficulty urinating, genital sores and testicular pain.  Musculoskeletal: Negative for myalgias, back pain, joint swelling, arthralgias and gait problem.  Skin: Negative for color change, rash and wound.  Neurological: Negative for dizziness, tremors, seizures, syncope, facial asymmetry, speech difficulty, weakness, light-headedness, numbness and headaches.  Hematological: Negative for adenopathy. Does not bruise/bleed easily.  Psychiatric/Behavioral: Negative for suicidal ideas, hallucinations, behavioral problems, confusion, sleep disturbance, self-injury, dysphoric mood, decreased concentration and agitation. The patient is not nervous/anxious.        Objective:   Physical Exam  Constitutional: He appears well-developed and well-nourished.  HENT:  Head: Normocephalic and atraumatic.  Right Ear: External ear normal.  Left Ear: External ear normal.  Nose: Nose normal.  Mouth/Throat: Oropharynx is clear and moist.  Eyes: Conjunctivae and EOM are normal. Pupils are equal, round, and reactive to light. No scleral icterus.  Neck: Normal range of motion. Neck supple. No JVD present. No thyromegaly present.  Cardiovascular: Regular rhythm, normal heart sounds and intact distal pulses.  Exam reveals no gallop and no friction rub.   No murmur heard. Pulmonary/Chest: Effort normal and breath sounds normal. He exhibits no tenderness.  Abdominal: Soft. Bowel sounds are normal. He exhibits no distension and no mass. There is no tenderness.  Genitourinary: Prostate normal and penis normal.  Musculoskeletal: Normal range of motion. He exhibits no edema and no tenderness.  Lymphadenopathy:    He has no cervical adenopathy.  Neurological: He is alert. He has normal reflexes. No cranial nerve deficit.  Coordination normal.  Skin: Skin is warm and dry. No rash noted.  Psychiatric: He has a normal mood and affect. His behavior is normal.          Assessment & Plan:   Preventive health examination  Exogenous obesity. Diet weight loss encouraged ADHD stable we'll continue present regimen Nephrolithiasis. Asymptomatic.  Recheck one year

## 2010-11-28 NOTE — Progress Notes (Signed)
  Subjective:    Patient ID: Adam Terry, male    DOB: 11-22-1975, 35 y.o.   MRN: [id number]  HPI  Wt Readings from Last 3 Encounters:  11/28/10 226 lb (102.513 kg)  09/13/10 234 lb (106.142 kg)  05/19/09 226 lb (102.513 kg)    Review of Systems     Objective:   Physical Exam        Assessment & Plan:

## 2011-08-14 ENCOUNTER — Other Ambulatory Visit: Payer: Self-pay | Admitting: Internal Medicine

## 2011-08-14 NOTE — Telephone Encounter (Signed)
Pt needs refill on ADDERALL XR 20 MG

## 2011-08-15 ENCOUNTER — Telehealth: Payer: Self-pay

## 2011-08-15 MED ORDER — AMPHETAMINE-DEXTROAMPHET ER 20 MG PO CP24: 20.0000 mg | ORAL_CAPSULE | ORAL | Status: DC

## 2011-08-15 NOTE — Telephone Encounter (Signed)
Error

## 2011-08-15 NOTE — Telephone Encounter (Signed)
Attempt to call pt- VM - LMTCB if questions - rx ready for pick up

## 2011-08-15 NOTE — Telephone Encounter (Signed)
Please advise - last seen 11/2010 - was given #90 at that time

## 2011-08-15 NOTE — Telephone Encounter (Signed)
Ok #90

## 2011-08-16 ENCOUNTER — Ambulatory Visit (INDEPENDENT_AMBULATORY_CARE_PROVIDER_SITE_OTHER): Payer: 59 | Admitting: Internal Medicine

## 2011-08-16 ENCOUNTER — Encounter: Payer: Self-pay | Admitting: Internal Medicine

## 2011-08-16 VITALS — BP 150/100 | Temp 98.5°F | Wt 216.0 lb

## 2011-08-16 DIAGNOSIS — G4700 Insomnia, unspecified: Secondary | ICD-10-CM

## 2011-08-16 DIAGNOSIS — F909 Attention-deficit hyperactivity disorder, unspecified type: Secondary | ICD-10-CM

## 2011-08-16 MED ORDER — TRAZODONE HCL 100 MG PO TABS: 100.0000 mg | ORAL_TABLET | Freq: Every day | ORAL | Status: DC

## 2011-08-16 NOTE — Progress Notes (Signed)
  Subjective:    Patient ID: Adam Terry, male    DOB: Jul 05, 1975, 36 y.o.   MRN: [id number]  HPI  36 year old patient who works the third shift who presents today with insomnia issues. He has a difficult time going to sleep when he attempts to retire at around noon each day. His schedule is changed somewhat in that he is now exercising 4 times weekly but does so late in the morning.    Review of Systems  Psychiatric/Behavioral: Positive for disturbed wake/sleep cycle.       Objective:   Physical Exam  Constitutional: He appears well-developed and well-nourished. No distress.       Repeat blood pressure 130/80          Assessment & Plan:   Insomnia. We'll give a trial of trazodone at bedtime. We'll attempt exercise immediately following his work schedule. If this regimen is not effective we'll consider short-term Ambien

## 2011-08-16 NOTE — Patient Instructions (Addendum)

## 2011-10-22 ENCOUNTER — Other Ambulatory Visit (INDEPENDENT_AMBULATORY_CARE_PROVIDER_SITE_OTHER): Payer: 59

## 2011-10-22 DIAGNOSIS — Z0000 Encounter for general adult medical examination without abnormal findings: Secondary | ICD-10-CM

## 2011-10-22 LAB — HEPATIC FUNCTION PANEL
ALT: 28 U/L (ref 0–53)
AST: 27 U/L (ref 0–37)
Albumin: 4.3 g/dL (ref 3.5–5.2)
Alkaline Phosphatase: 64 U/L (ref 39–117)
Bilirubin, Direct: 0 mg/dL (ref 0.0–0.3)
Total Bilirubin: 0.5 mg/dL (ref 0.3–1.2)
Total Protein: 8 g/dL (ref 6.0–8.3)

## 2011-10-22 LAB — CBC WITH DIFFERENTIAL/PLATELET
Basophils Absolute: 0 10*3/uL (ref 0.0–0.1)
Basophils Relative: 0.6 % (ref 0.0–3.0)
Eosinophils Absolute: 0.2 10*3/uL (ref 0.0–0.7)
Eosinophils Relative: 2.5 % (ref 0.0–5.0)
HCT: 42.7 % (ref 39.0–52.0)
Hemoglobin: 14.2 g/dL (ref 13.0–17.0)
Lymphocytes Relative: 25.7 % (ref 12.0–46.0)
Lymphs Abs: 1.8 10*3/uL (ref 0.7–4.0)
MCHC: 33.3 g/dL (ref 30.0–36.0)
MCV: 93.9 fl (ref 78.0–100.0)
Monocytes Absolute: 0.5 10*3/uL (ref 0.1–1.0)
Monocytes Relative: 7.2 % (ref 3.0–12.0)
Neutro Abs: 4.5 10*3/uL (ref 1.4–7.7)
Neutrophils Relative %: 64 % (ref 43.0–77.0)
Platelets: 202 10*3/uL (ref 150.0–400.0)
RBC: 4.55 Mil/uL (ref 4.22–5.81)
RDW: 13.3 % (ref 11.5–14.6)
WBC: 7 10*3/uL (ref 4.5–10.5)

## 2011-10-22 LAB — LIPID PANEL
Cholesterol: 181 mg/dL (ref 0–200)
HDL: 39.3 mg/dL (ref >39.00)
LDL Cholesterol: 126 mg/dL — ABNORMAL HIGH (ref 0–99)
Total CHOL/HDL Ratio: 5
Triglycerides: 78 mg/dL (ref 0.0–149.0)
VLDL: 15.6 mg/dL (ref 0.0–40.0)

## 2011-10-22 LAB — POCT URINALYSIS DIPSTICK
Bilirubin, UA: NEGATIVE
Blood, UA: NEGATIVE
Glucose, UA: NEGATIVE
Ketones, UA: NEGATIVE
Leukocytes, UA: NEGATIVE
Nitrite, UA: NEGATIVE
Protein, UA: NEGATIVE
Spec Grav, UA: 1.02
Urobilinogen, UA: 0.2
pH, UA: 7

## 2011-10-22 LAB — TSH: TSH: 2.04 u[IU]/mL (ref 0.35–5.50)

## 2011-10-22 LAB — BASIC METABOLIC PANEL
BUN: 11 mg/dL (ref 6–23)
CO2: 27 mEq/L (ref 19–32)
Calcium: 9 mg/dL (ref 8.4–10.5)
Chloride: 102 mEq/L (ref 96–112)
Creatinine, Ser: 0.9 mg/dL (ref 0.4–1.5)
GFR: 96.55 mL/min (ref >60.00)
Glucose, Bld: 93 mg/dL (ref 70–99)
Potassium: 4 mEq/L (ref 3.5–5.1)
Sodium: 137 mEq/L (ref 135–145)

## 2011-10-29 ENCOUNTER — Ambulatory Visit (INDEPENDENT_AMBULATORY_CARE_PROVIDER_SITE_OTHER): Payer: 59 | Admitting: Internal Medicine

## 2011-10-29 ENCOUNTER — Encounter: Payer: Self-pay | Admitting: Internal Medicine

## 2011-10-29 VITALS — BP 128/90 | HR 86 | Temp 97.7°F | Resp 20 | Ht 67.0 in | Wt 216.0 lb

## 2011-10-29 DIAGNOSIS — Z0000 Encounter for general adult medical examination without abnormal findings: Secondary | ICD-10-CM

## 2011-10-29 MED ORDER — AMPHETAMINE-DEXTROAMPHET ER 20 MG PO CP24: 20.0000 mg | ORAL_CAPSULE | ORAL | Status: DC

## 2011-10-29 MED ORDER — TRAZODONE HCL 100 MG PO TABS: 100.0000 mg | ORAL_TABLET | Freq: Every day | ORAL | Status: DC

## 2011-10-29 MED ORDER — LEVOCETIRIZINE DIHYDROCHLORIDE 5 MG PO TABS: 5.0000 mg | ORAL_TABLET | Freq: Every day | ORAL | Status: DC

## 2011-10-29 NOTE — Progress Notes (Signed)
Patient ID: Adam Terry, male   DOB: 03/15/1975, 36 y.o.   MRN: [id number]  Subjective:    Patient ID: Adam Terry, male    DOB: 1975-07-19, 36 y.o.   MRN: [id number]  HPI   36 year old patient who is seen today for a wellness exam. He has a history of allergic rhinitis he continues to receive immunotherapy. He was seen recently for insomnia that has improved in spite of job loss. His weight is down modestly. He has a history of nephrolithiasis which has been stable. He has a history also of ADHD which has been well controlled with Adderall.   Current Allergies:   ! SULFAMETHOXAZOLE (SULFAMETHOXAZOLE)  ! PENICILLIN V POTASSIUM (PENICILLIN V POTASSIUM)   Past Medical History:  Reviewed history from 10/20/2007 and no changes required:  Allergic rhinitis  calcium oxalate kidney stones  Nephrolithiasis, hx of  ADHD   Past Surgical History:  Reviewed history and no changes required:  Tonsillectomy   Family History:  Mother - kidney stone  both parents good health. Maternal grandmother history of breast cancer and  paternal grandmother history of MI, age 38  one uncle died of complications of leukemia   Social History:   Married works for [employer] health care Never Smoked  Alcohol use-no  Formerly employed in the Geneticist, molecular with - department recently outsourced  Past Medical History  Diagnosis Date  . ALLERGIC RHINITIS 09/17/2007  . NEPHROLITHIASIS, HX OF 10/20/2007  . STONE, URINARY CALCULUS,UNSPEC. 09/17/2007  . ADHD (attention deficit hyperactivity disorder)     History   Social History  . Marital Status: Married    Spouse Name: N/A    Number of Children: N/A  . Years of Education: N/A   Occupational History  . Not on file.   Social History Main Topics  . Smoking status: Never Smoker   . Smokeless tobacco: Never Used  . Alcohol Use: No  . Drug Use: No  . Sexually Active: Not on file   Other Topics Concern  . Not on file   Social  History Narrative  . No narrative on file    Past Surgical History  Procedure Date  . Tonsillectomy     Family History  Problem Relation Age of Onset  . Urolithiasis Mother   . Leukemia Maternal Uncle   . Heart disease Maternal Grandmother     Allergies  Allergen Reactions  . Penicillins     REACTION: hives, throat closes  . Sulfamethoxazole     REACTION: hives, throat closes    Current Outpatient Prescriptions on File Prior to Visit  Medication Sig Dispense Refill  . amphetamine-dextroamphetamine (ADDERALL XR) 20 MG 24 hr capsule Take 1 capsule (20 mg total) by mouth every morning.  90 capsule  0  . levocetirizine (XYZAL) 5 MG tablet Take 5 mg by mouth daily.        Marland Kitchen DISCONTD: traZODone (DESYREL) 100 MG tablet Take 1 tablet (100 mg total) by mouth at bedtime.  60 tablet  2    BP 128/90  Pulse 86  Temp 97.7 F (36.5 C) (Oral)  Resp 20  Ht 5\' 7"  (1.702 m)  Wt 216 lb (97.977 kg)  BMI 33.83 kg/m2  SpO2 95%     Review of Systems  Constitutional: Negative for fever, chills, activity change, appetite change and fatigue.  HENT: Negative for hearing loss, ear pain, congestion, rhinorrhea, sneezing, mouth sores, trouble swallowing, neck pain, neck stiffness, dental problem, voice change, sinus pressure  and tinnitus.   Eyes: Negative for photophobia, pain, redness and visual disturbance.  Respiratory: Negative for apnea, cough, choking, chest tightness, shortness of breath and wheezing.   Cardiovascular: Negative for chest pain, palpitations and leg swelling.  Gastrointestinal: Negative for nausea, vomiting, abdominal pain, diarrhea, constipation, blood in stool, abdominal distention, anal bleeding and rectal pain.  Genitourinary: Negative for dysuria, urgency, frequency, hematuria, flank pain, decreased urine volume, discharge, penile swelling, scrotal swelling, difficulty urinating, genital sores and testicular pain.  Musculoskeletal: Negative for myalgias, back pain,  joint swelling, arthralgias and gait problem.  Skin: Negative for color change, rash and wound.  Neurological: Negative for dizziness, tremors, seizures, syncope, facial asymmetry, speech difficulty, weakness, light-headedness, numbness and headaches.  Hematological: Negative for adenopathy. Does not bruise/bleed easily.  Psychiatric/Behavioral: Negative for suicidal ideas, hallucinations, behavioral problems, confusion, disturbed wake/sleep cycle, self-injury, dysphoric mood, decreased concentration and agitation. The patient is not nervous/anxious.        Objective:   Physical Exam  Constitutional: He appears well-developed and well-nourished.  HENT:  Head: Normocephalic and atraumatic.  Right Ear: External ear normal.  Left Ear: External ear normal.  Nose: Nose normal.  Mouth/Throat: Oropharynx is clear and moist.  Eyes: Conjunctivae normal and EOM are normal. Pupils are equal, round, and reactive to light. No scleral icterus.  Neck: Normal range of motion. Neck supple. No JVD present. No thyromegaly present.  Cardiovascular: Regular rhythm, normal heart sounds and intact distal pulses.  Exam reveals no gallop and no friction rub.   No murmur heard. Pulmonary/Chest: Effort normal and breath sounds normal. He exhibits no tenderness.  Abdominal: Soft. Bowel sounds are normal. He exhibits no distension and no mass. There is no tenderness.  Genitourinary: Penis normal.  Musculoskeletal: Normal range of motion. He exhibits no edema and no tenderness.  Lymphadenopathy:    He has no cervical adenopathy.  Neurological: He is alert. He has normal reflexes. No cranial nerve deficit. Coordination normal.  Skin: Skin is warm and dry. No rash noted.  Psychiatric: He has a normal mood and affect. His behavior is normal.          Assessment & Plan:   Preventive health examination  Exogenous obesity. Diet weight loss encouraged ADHD stable we'll continue present regimen Nephrolithiasis.  Asymptomatic.  Recheck one year

## 2011-10-29 NOTE — Patient Instructions (Signed)
It is important that you exercise regularly, at least 20 minutes 3 to 4 times per week.  If you develop chest pain or shortness of breath seek  medical attention.  You need to lose weight.  Consider a lower calorie diet and regular exercise.  Return in one year for follow-up   

## 2011-11-04 ENCOUNTER — Telehealth: Payer: Self-pay | Admitting: Internal Medicine

## 2011-11-04 NOTE — Telephone Encounter (Signed)
[facility] rcvd a script for amphetamine-dextroamphetamine (ADDERALL XR) 20 MG 24 hr capsule #90. Pharmacy wanted to know if this should have been #30? Pls call.

## 2011-11-04 NOTE — Telephone Encounter (Signed)
Spoke with [facility] - they will shred 2 rx's - and fill only 1 # 90 - we have filled it this way for him before.  Pt aware.

## 2012-05-21 ENCOUNTER — Ambulatory Visit (INDEPENDENT_AMBULATORY_CARE_PROVIDER_SITE_OTHER): Payer: Managed Care, Other (non HMO) | Admitting: Family Medicine

## 2012-05-21 ENCOUNTER — Telehealth: Payer: Self-pay | Admitting: Internal Medicine

## 2012-05-21 ENCOUNTER — Encounter: Payer: Self-pay | Admitting: Family Medicine

## 2012-05-21 VITALS — BP 120/80 | Temp 98.4°F

## 2012-05-21 DIAGNOSIS — M7660 Achilles tendinitis, unspecified leg: Secondary | ICD-10-CM

## 2012-05-21 DIAGNOSIS — M7662 Achilles tendinitis, left leg: Secondary | ICD-10-CM

## 2012-05-21 NOTE — Progress Notes (Addendum)
  Subjective:    Patient ID: Adam Terry, male    DOB: Jul 27, 1975, 37 y.o.   MRN: [id number]  HPI Acute visit. Left Achilles pain. Onset about 4-5 days ago. No injury. Pain is moderate. Achy pain which is worse with walking and movement but somewhat at rest. No visible swelling or ecchymosis. Denies any specific injury. No recent change of shoe wear. Tried some icing with minimal relief.  Past Medical History  Diagnosis Date  . ALLERGIC RHINITIS 09/17/2007  . NEPHROLITHIASIS, HX OF 10/20/2007  . STONE, URINARY CALCULUS,UNSPEC. 09/17/2007  . ADHD (attention deficit hyperactivity disorder)    Past Surgical History  Procedure Laterality Date  . Tonsillectomy      reports that he has never smoked. He has never used smokeless tobacco. He reports that he does not drink alcohol or use illicit drugs. family history includes Heart disease in his maternal grandmother; Leukemia in his maternal uncle; and Urolithiasis in his mother. Allergies  Allergen Reactions  . Penicillins     REACTION: hives, throat closes  . Sulfamethoxazole     REACTION: hives, throat closes      Review of Systems  Neurological: Negative for weakness and numbness.       Objective:   Physical Exam  Constitutional: He appears well-developed and well-nourished.  Cardiovascular: Normal rate and regular rhythm.   Musculoskeletal:  Left foot and ankle reveal no visible edema. No ecchymosis. Full range of motion left foot with plantar flexion and dorsi flexion. Achilles reveals tenderness at the distal attachment. Fully intact. Good distal foot pulses. No retrocalcaneal bursa tenderness          Assessment & Plan:  Left Achilles tendinitis. Reviewed gentle stretches. Try over-the-counter Aleve or Advil. Try icing 3-4 times daily. He is not have evidence for any tear Patient requested Ace wrap for comfort. Instructed in how to apply

## 2012-05-21 NOTE — Patient Instructions (Signed)
Achilles Tendinitis  Tendinitis a swelling and soreness of the tendon. The pain in the tendon (cord-like structure which attaches muscle to bone) is produced by tiny tears and the inflammation present in that tendon. It commonly occurs at the shoulders, heels, and elbows. It is usually caused by overusing the tendon and joint involved. Achilles tendinitis involves the Achilles tendon. This is the large tendon in the back of the leg just above the foot. It attaches the large muscles of the lower leg to the heel bone (called calcaneus).   This diagnosis (learning what is wrong) is made by examination. X-rays will be generally be normal if only tendinitis is present.  HOME CARE INSTRUCTIONS    Apply ice to the injury for 15 to 20 minutes, 3 to 4 times per day. Put the ice in a plastic bag and place a towel between the bag of ice and your skin.   Try to avoid use other than gentle range of motion while the tendon is painful. Do not resume use until instructed by your caregiver. Then begin use gradually. Do not increase use to the point of pain. If pain does develop, decrease use and continue the above measures. Gradually increase activities that do not cause discomfort until you gradually achieve normal use.   Only take over-the-counter or prescription medicines for pain, discomfort, or fever as directed by your caregiver.  SEEK MEDICAL CARE IF:    Your pain and swelling increase or pain is uncontrolled with medications.   You develop new, unexplained problems (symptoms) or an increase of the symptoms that brought you to your caregiver.   You develop an inability to move your toes or foot, develop warmth and swelling in your foot, or begin running an unexplained temperature.  MAKE SURE YOU:    Understand these instructions.   Will watch your condition.   Will get help right away if you are not doing well or get worse.  Document Released: 10/10/2004 Document Revised: 03/25/2011 Document Reviewed:  08/19/2007  [employer] Patient Information 2013 [employer].

## 2012-05-21 NOTE — Telephone Encounter (Signed)
Patient Information:  Caller Name: Rowyn  Phone: [phone]  Patient: Adam Terry, Adam Terry  Gender: Male  DOB: 1975-07-10  Age: 37 Years  PCP: Eleonore Chiquito [facility])  Office Follow Up:  Does the office need to follow up with this patient?: Yes  Instructions For The Office: Pt available for an appt after 1600 05/21/2012 or Monday after 1300.  Dr. Charm Rings pt.   Please call and leave VM or pt can respond via email  [email or web address]. Thank you.   Symptoms  Reason For Call & Symptoms: While walking , left heel/Achille's tendo became sore.  The pain has steadily increased.  With walking pain is 7/10 and 4/10 at rest.  No swelling, no known injury and no bruising or redness to the area.    Reviewed Health History In EMR: Yes  Reviewed Medications In EMR: Yes  Reviewed Allergies In EMR: Yes  Reviewed Surgeries / Procedures: Yes  Date of Onset of Symptoms: 05/18/2012  Treatments Tried: Ice  Treatments Tried Worked: No  Guideline(s) Used:  Ankle and Foot Injury  Disposition Per Guideline:   See Within 3 Days in Office  Reason For Disposition Reached:   Injury and pain has not improved after 3 days  Advice Given:  Here is some care advice that should help.  Reassurance - Bending or Twisting Injury (Strain, Sprain):  Strain and sprain are the medical terms used to describe over-stretching of the muscles and ligaments of the ankle or foot. A twisting or bending injury can cause a strain or sprain.  The main symptom is pain that is worse with movement and walking. Swelling can occur. Rarely there may be slight bruising.  Here is some care advice that should help.  Apply Heat to the Area:  Beginning 48 hours after an injury, apply a warm washcloth or heating pad for 10 minutes three times a day.  This will help increase blood flow and improve healing.  Elevate the Ankle and Foot:  Lay down and put your ankle and foot on a pillow. This puts (elevates) the ankle and foot above  the heart.  Rest vs. Movement:  Movement is generally more healing in the long term than rest.  Avoid running and active sports for 1-2 weeks or until the pain and swelling are gone.  Complete rest should only be used for the first day or two after an injury. If it really hurts too much to walk, you will need to see the doctor.   Call Back If:  Pain becomes severe  You become worse.  Patient Will Follow Care Advice:  YES

## 2012-06-10 ENCOUNTER — Telehealth: Payer: Self-pay | Admitting: Internal Medicine

## 2012-06-10 NOTE — Telephone Encounter (Signed)
PT stopped by to request a refill of his amphetamine-dextroamphetamine (ADDERALL XR) 20 MG 24 hr capsule. He takes this once daily. Please assist.

## 2012-06-11 MED ORDER — AMPHETAMINE-DEXTROAMPHET ER 20 MG PO CP24: 20.0000 mg | ORAL_CAPSULE | ORAL | Status: DC

## 2012-06-11 NOTE — Telephone Encounter (Signed)
Spoke to pt told him Rx's for Adderall are ready for pick up will be at the front desk. Pt verbalized understanding.

## 2012-11-10 ENCOUNTER — Telehealth: Payer: Self-pay | Admitting: Internal Medicine

## 2012-11-10 MED ORDER — AMPHETAMINE-DEXTROAMPHET ER 20 MG PO CP24: 20.0000 mg | ORAL_CAPSULE | ORAL | Status: DC

## 2012-11-10 NOTE — Telephone Encounter (Signed)
Pt needs new rx generic adderall xr 30mg  #30

## 2012-11-10 NOTE — Telephone Encounter (Signed)
Left message on voicemail to call office. Pt needs an appointment, Rx for one month printed and signed put at front desk for pick up.

## 2012-11-11 ENCOUNTER — Other Ambulatory Visit (INDEPENDENT_AMBULATORY_CARE_PROVIDER_SITE_OTHER): Payer: Managed Care, Other (non HMO)

## 2012-11-11 ENCOUNTER — Telehealth: Payer: Self-pay | Admitting: Internal Medicine

## 2012-11-11 ENCOUNTER — Other Ambulatory Visit: Payer: Self-pay | Admitting: Internal Medicine

## 2012-11-11 DIAGNOSIS — Z0000 Encounter for general adult medical examination without abnormal findings: Secondary | ICD-10-CM

## 2012-11-11 LAB — POCT URINALYSIS DIPSTICK
Bilirubin, UA: NEGATIVE
Blood, UA: NEGATIVE
Glucose, UA: NEGATIVE
Ketones, UA: NEGATIVE
Leukocytes, UA: NEGATIVE
Nitrite, UA: NEGATIVE
Protein, UA: NEGATIVE
Spec Grav, UA: 1.025
Urobilinogen, UA: 0.2
pH, UA: 5.5

## 2012-11-11 LAB — HEPATIC FUNCTION PANEL
ALT: 26 U/L (ref 0–53)
AST: 21 U/L (ref 0–37)
Albumin: 4.6 g/dL (ref 3.5–5.2)
Alkaline Phosphatase: 67 U/L (ref 39–117)
Bilirubin, Direct: 0 mg/dL (ref 0.0–0.3)
Total Bilirubin: 0.6 mg/dL (ref 0.3–1.2)
Total Protein: 8.1 g/dL (ref 6.0–8.3)

## 2012-11-11 LAB — BASIC METABOLIC PANEL
BUN: 14 mg/dL (ref 6–23)
CO2: 29 mEq/L (ref 19–32)
Calcium: 9.5 mg/dL (ref 8.4–10.5)
Chloride: 101 mEq/L (ref 96–112)
Creatinine, Ser: 1.1 mg/dL (ref 0.4–1.5)
GFR: 77.48 mL/min (ref >60.00)
Glucose, Bld: 86 mg/dL (ref 70–99)
Potassium: 4 mEq/L (ref 3.5–5.1)
Sodium: 140 mEq/L (ref 135–145)

## 2012-11-11 LAB — LIPID PANEL
Cholesterol: 168 mg/dL (ref 0–200)
HDL: 34.3 mg/dL — ABNORMAL LOW (ref >39.00)
LDL Cholesterol: 101 mg/dL — ABNORMAL HIGH (ref 0–99)
Total CHOL/HDL Ratio: 5
Triglycerides: 166 mg/dL — ABNORMAL HIGH (ref 0.0–149.0)
VLDL: 33.2 mg/dL (ref 0.0–40.0)

## 2012-11-11 LAB — CBC WITH DIFFERENTIAL/PLATELET
Basophils Absolute: 0 10*3/uL (ref 0.0–0.1)
Basophils Relative: 0.6 % (ref 0.0–3.0)
Eosinophils Absolute: 0.2 10*3/uL (ref 0.0–0.7)
Eosinophils Relative: 2.7 % (ref 0.0–5.0)
HCT: 43.5 % (ref 39.0–52.0)
Hemoglobin: 15.1 g/dL (ref 13.0–17.0)
Lymphocytes Relative: 28.6 % (ref 12.0–46.0)
Lymphs Abs: 2.3 10*3/uL (ref 0.7–4.0)
MCHC: 34.8 g/dL (ref 30.0–36.0)
MCV: 90 fl (ref 78.0–100.0)
Monocytes Absolute: 0.6 10*3/uL (ref 0.1–1.0)
Monocytes Relative: 8.1 % (ref 3.0–12.0)
Neutro Abs: 4.7 10*3/uL (ref 1.4–7.7)
Neutrophils Relative %: 60 % (ref 43.0–77.0)
Platelets: 216 10*3/uL (ref 150.0–400.0)
RBC: 4.83 Mil/uL (ref 4.22–5.81)
RDW: 13.1 % (ref 11.5–14.6)
WBC: 7.9 10*3/uL (ref 4.5–10.5)

## 2012-11-11 LAB — TSH: TSH: 2.09 u[IU]/mL (ref 0.35–5.50)

## 2012-11-11 NOTE — Telephone Encounter (Signed)
Please order cpx labs for pt.  He came in fasting today and wants to have labs done today for his 11/5 cpx. Thank you

## 2012-11-11 NOTE — Telephone Encounter (Signed)
Pt stopped by office to pick up Rx, told him need to schedule physical was due this month only given one month Rx. Pt verbalized understanding.

## 2012-11-11 NOTE — Telephone Encounter (Signed)
Lab orders were already done.

## 2012-11-16 ENCOUNTER — Encounter: Payer: Managed Care, Other (non HMO) | Admitting: Internal Medicine

## 2012-11-18 ENCOUNTER — Ambulatory Visit (INDEPENDENT_AMBULATORY_CARE_PROVIDER_SITE_OTHER): Payer: Managed Care, Other (non HMO) | Admitting: Internal Medicine

## 2012-11-18 ENCOUNTER — Encounter: Payer: Self-pay | Admitting: Internal Medicine

## 2012-11-18 VITALS — BP 140/90 | HR 98 | Temp 97.9°F | Resp 20 | Ht 67.0 in | Wt 217.0 lb

## 2012-11-18 DIAGNOSIS — F909 Attention-deficit hyperactivity disorder, unspecified type: Secondary | ICD-10-CM

## 2012-11-18 DIAGNOSIS — Z0000 Encounter for general adult medical examination without abnormal findings: Secondary | ICD-10-CM

## 2012-11-18 DIAGNOSIS — Z87442 Personal history of urinary calculi: Secondary | ICD-10-CM

## 2012-11-18 NOTE — Patient Instructions (Signed)
It is important that you exercise regularly, at least 20 minutes 3 to 4 times per week.  If you develop chest pain or shortness of breath seek  medical attention.  You need to lose weight.  Consider a lower calorie diet and regular exercise.  Return in one year for follow-up   

## 2012-11-18 NOTE — Progress Notes (Signed)
Patient ID: Adam Terry, male   DOB: 08/10/1975, 37 y.o.   MRN: [id number]  Subjective:    Patient ID: Adam Terry, male    DOB: 1975/11/24, 37 y.o.   MRN: [id number]  HPI  Pre-visit discussion using our clinic review tool. No additional management support is needed unless otherwise documented below in the visit note.   60 -year-old patient who is seen today for a wellness exam. He has a history of allergic rhinitis;  he continues to receive immunotherapy.  He has a history of nephrolithiasis which has been stable. He has a history also of ADHD which has been well controlled with Adderall.   Wt Readings from Last 3 Encounters:  11/18/12 217 lb (98.431 kg)  10/29/11 216 lb (97.977 kg)  08/16/11 216 lb (97.977 kg)    Current Allergies:   ! SULFAMETHOXAZOLE (SULFAMETHOXAZOLE)  ! PENICILLIN V POTASSIUM (PENICILLIN V POTASSIUM)   Past Medical History:   Allergic rhinitis  calcium oxalate kidney stones  Nephrolithiasis, hx of  ADHD   Past Surgical History:   Tonsillectomy   Family History:  Father- HTN Mother - kidney stone DM2 HLD both parents good health. 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 ALLERGIC RHINITIS 09/17/2007  . NEPHROLITHIASIS, HX OF 10/20/2007  . STONE, URINARY CALCULUS,UNSPEC. 09/17/2007  . ADHD (attention deficit hyperactivity disorder)     History   Social History  . Marital Status: Married    Spouse Name: N/A    Number of Children: N/A  . Years of Education: N/A   Occupational History  . Not on file.   Social History Main Topics  . Smoking status: Never Smoker   . Smokeless tobacco: Never Used  . Alcohol Use: No   . Drug Use: No  . Sexual Activity: Not on file   Other Topics Concern  . Not on file   Social History Narrative  . No narrative on file    Past Surgical History  Procedure Laterality Date  . Tonsillectomy      Family History  Problem Relation Age of Onset  . Urolithiasis Mother   . Leukemia Maternal Uncle   . Heart disease Maternal Grandmother     Allergies  Allergen Reactions  . Penicillins     REACTION: hives, throat closes  . Sulfamethoxazole     REACTION: hives, throat closes    Current Outpatient Prescriptions on File Prior to Visit  Medication Sig Dispense Refill  . amphetamine-dextroamphetamine (ADDERALL XR) 20 MG 24 hr capsule Take 1 capsule (20 mg total) by mouth every morning.  30 capsule  0  . amphetamine-dextroamphetamine (ADDERALL XR) 20 MG 24 hr capsule Take 1 capsule (20 mg total) by mouth every morning.  30 capsule  0  . amphetamine-dextroamphetamine (ADDERALL XR) 20 MG 24 hr capsule Take 1 capsule (20 mg total) by mouth every morning.  30 capsule  0  . levocetirizine (XYZAL) 5 MG tablet Take 1 tablet (5 mg total) by mouth daily.  60 tablet  4   No current facility-administered medications on file prior to visit.    BP 140/90  Pulse 98  Temp(Src) 97.9 F (36.6 C) (Oral)  Resp  20  Ht 5\' 7"  (1.702 m)  Wt 217 lb (98.431 kg)  BMI 33.98 kg/m2  SpO2 99%     Review of Systems  Constitutional: Negative for fever, chills, activity change, appetite change and fatigue.  HENT: Negative for congestion, dental problem, ear pain, hearing loss, mouth sores, rhinorrhea, sinus pressure, sneezing, tinnitus, trouble swallowing and voice change.   Eyes: Negative for photophobia, pain, redness and visual disturbance.  Respiratory: Negative for apnea, cough, choking, chest tightness, shortness of breath and wheezing.   Cardiovascular: Negative for chest pain, palpitations and leg swelling.  Gastrointestinal: Negative for nausea, vomiting, abdominal pain,  diarrhea, constipation, blood in stool, abdominal distention, anal bleeding and rectal pain.  Genitourinary: Negative for dysuria, urgency, frequency, hematuria, flank pain, decreased urine volume, discharge, penile swelling, scrotal swelling, difficulty urinating, genital sores and testicular pain.  Musculoskeletal: Negative for arthralgias, back pain, gait problem, joint swelling, myalgias, neck pain and neck stiffness.       Mild left knee pain for several months  Skin: Negative for color change, rash and wound.  Neurological: Negative for dizziness, tremors, seizures, syncope, facial asymmetry, speech difficulty, weakness, light-headedness, numbness and headaches.  Hematological: Negative for adenopathy. Does not bruise/bleed easily.  Psychiatric/Behavioral: Negative for suicidal ideas, hallucinations, behavioral problems, confusion, sleep disturbance, self-injury, dysphoric mood, decreased concentration and agitation. The patient is not nervous/anxious.        Objective:   Physical Exam  Constitutional: He appears well-developed and well-nourished.  HENT:  Head: Normocephalic and atraumatic.  Right Ear: External ear normal.  Left Ear: External ear normal.  Nose: Nose normal.  Mouth/Throat: Oropharynx is clear and moist.  Eyes: Conjunctivae and EOM are normal. Pupils are equal, round, and reactive to light. No scleral icterus.  Neck: Normal range of motion. Neck supple. No JVD present. No thyromegaly present.  Cardiovascular: Regular rhythm, normal heart sounds and intact distal pulses.  Exam reveals no gallop and no friction rub.   No murmur heard. Pulmonary/Chest: Effort normal and breath sounds normal. He exhibits no tenderness.  Abdominal: Soft. Bowel sounds are normal. He exhibits no distension and no mass. There is no tenderness.  Genitourinary: Penis normal.  Musculoskeletal: Normal range of motion. He exhibits no edema and no tenderness.  Left knee slightly warm to touch but  no obvious effusion  Lymphadenopathy:    He has no cervical adenopathy.  Neurological: He is alert. He has normal reflexes. No cranial nerve deficit. Coordination normal.  Skin: Skin is warm and dry. No rash noted.  Psychiatric: He has a normal mood and affect. His behavior is normal.          Assessment & Plan:   Preventive health examination  Exogenous obesity. Diet weight loss encouraged ADHD stable we'll continue present regimen Nephrolithiasis. Asymptomatic. Left knee pain. Weight loss encouraged Will continue to observe and use when necessary Advil. We'll call if the any clinical worsening  Recheck one year

## 2013-01-01 ENCOUNTER — Telehealth: Payer: Self-pay | Admitting: Internal Medicine

## 2013-01-01 NOTE — Telephone Encounter (Signed)
Pt needs new rx generic adderall xr 20 #30. Pt will like 3 rx for next 3 month

## 2013-01-04 MED ORDER — AMPHETAMINE-DEXTROAMPHET ER 20 MG PO CP24: 20.0000 mg | ORAL_CAPSULE | ORAL | Status: DC

## 2013-01-04 MED ORDER — AMPHETAMINE-DEXTROAMPHET ER 20 MG PO CP24: 20.0000 mg | ORAL_CAPSULE | Freq: Every day | ORAL | Status: DC

## 2013-01-04 NOTE — Telephone Encounter (Signed)
Rx ready for pick up and patient is aware 

## 2013-06-01 ENCOUNTER — Other Ambulatory Visit: Payer: Self-pay | Admitting: *Deleted

## 2013-06-01 MED ORDER — AMPHETAMINE-DEXTROAMPHET ER 20 MG PO CP24: 20.0000 mg | ORAL_CAPSULE | Freq: Every day | ORAL | Status: DC

## 2013-06-01 MED ORDER — AMPHETAMINE-DEXTROAMPHET ER 20 MG PO CP24: 20.0000 mg | ORAL_CAPSULE | ORAL | Status: DC

## 2013-06-01 NOTE — Telephone Encounter (Signed)
Pt came by office stating his last Rx was accidentally thrown away when moving. Dr. Raliegh Ip approved refills. Rx's printed and signed and given to pt.

## 2013-06-20 ENCOUNTER — Ambulatory Visit: Payer: BC Managed Care – PPO

## 2013-06-20 ENCOUNTER — Ambulatory Visit (INDEPENDENT_AMBULATORY_CARE_PROVIDER_SITE_OTHER): Payer: BC Managed Care – PPO | Admitting: Family Medicine

## 2013-06-20 VITALS — BP 158/126 | HR 85 | Temp 97.7°F | Resp 16 | Ht 67.0 in | Wt 234.4 lb

## 2013-06-20 DIAGNOSIS — M542 Cervicalgia: Secondary | ICD-10-CM

## 2013-06-20 DIAGNOSIS — M5412 Radiculopathy, cervical region: Secondary | ICD-10-CM

## 2013-06-20 MED ORDER — OXYCODONE-ACETAMINOPHEN 5-325 MG PO TABS: 1.0000 | ORAL_TABLET | ORAL | Status: DC | PRN

## 2013-06-20 MED ORDER — METHYLPREDNISOLONE SODIUM SUCC 125 MG IJ SOLR
125.0000 mg | Freq: Once | INTRAMUSCULAR | Status: AC
  Administered 2013-06-20: 125 mg via INTRAMUSCULAR

## 2013-06-20 MED ORDER — DIAZEPAM 5 MG PO TABS: 5.0000 mg | ORAL_TABLET | Freq: Three times a day (TID) | ORAL | Status: DC | PRN

## 2013-06-20 MED ORDER — PREDNISONE 20 MG PO TABS: ORAL_TABLET | ORAL | Status: DC

## 2013-06-20 NOTE — Progress Notes (Signed)
Subjective:    Patient ID: Adam Terry, male    DOB: 1975/05/12, 38 y.o.   MRN: [id number] Chief Complaint  Patient presents with  . Shoulder Pain    pain radiates into hand, pt states finger are numb, pt woke up this morning like that    HPI  He was fine yesterday, did not do anything differently, no heavy lifting or unusual activity.  Slept in bed last night w/ 1 pillow as normal. Woke up around 8 a.m. as per usual but when he started moving around this morning he developed a lot of pain in the right side of his neck that radiates around posterior shoulder and into arm into forearm, then thumb, 2nd, and 3rd fingers feel like they are asleep - numb.  Took 2 aleve w/o any relief and took a hot shower w/ some relief. Massage didn't help at all.  Can't use right arm at all - wasn't able to grasp his chapstick or open drink.  Gets worse when bending neck foreward. No h/o any trauma, sports, MVA that could have caused this - even distantly  Has had occ a crick in the neck but no h/o anything like this that he has had to come to the doctor for.  No HA. On allergy shots.    Works at [employer]. Is right-handed. Works at [employer] all day.  H/o kidney stones - states kidney stones were 10/10 pain. He is currently at 8/10. Reports percocet 5 didn't help his kidney stone pain - required oxycodone 10 IR. States he has high pain tolerance and high pain med tolerance.   Past Medical History  Diagnosis Date  . ALLERGIC RHINITIS 09/17/2007  . NEPHROLITHIASIS, HX OF 10/20/2007  . STONE, URINARY CALCULUS,UNSPEC. 09/17/2007  . ADHD (attention deficit hyperactivity disorder)   . Allergy    Current Outpatient Prescriptions on File Prior to Visit  Medication Sig Dispense Refill  . amphetamine-dextroamphetamine (ADDERALL XR) 20 MG 24 hr capsule Take 1 capsule (20 mg total) by mouth daily.  30 capsule  0  . amphetamine-dextroamphetamine (ADDERALL XR) 20 MG 24 hr capsule Take 1 capsule (20 mg total) by mouth every  morning.  30 capsule  0  . amphetamine-dextroamphetamine (ADDERALL XR) 20 MG 24 hr capsule Take 1 capsule (20 mg total) by mouth every morning.  30 capsule  0  . aspirin-acetaminophen-caffeine (EXCEDRIN MIGRAINE) 163-845-36 MG per tablet Take 1 tablet by mouth every 6 (six) hours as needed for headache.      . levocetirizine (XYZAL) 5 MG tablet Take 1 tablet (5 mg total) by mouth daily.  60 tablet  4   No current facility-administered medications on file prior to visit.   Allergies  Allergen Reactions  . Penicillins     REACTION: hives, throat closes  . Sulfamethoxazole     REACTION: hives, throat closes     Review of Systems  Constitutional: Negative for fever, chills, activity change, appetite change and unexpected weight change.  Cardiovascular: Negative for chest pain.  Gastrointestinal: Negative for vomiting.  Musculoskeletal: Positive for arthralgias, back pain, myalgias, neck pain and neck stiffness. Negative for gait problem and joint swelling.  Skin: Negative for color change, rash and wound.  Allergic/Immunologic: Positive for environmental allergies.  Neurological: Positive for weakness and numbness. Negative for dizziness, tremors, syncope, facial asymmetry, light-headedness and headaches.  Hematological: Negative for adenopathy. Does not bruise/bleed easily.  Psychiatric/Behavioral: Negative for sleep disturbance.      BP 158/126  Pulse 85  Temp(Src) 97.7 F (36.5 C) (Oral)  Resp 16  Ht 5\' 7"  (1.702 m)  Wt 234 lb 6.4 oz (106.323 kg)  BMI 36.70 kg/m2  SpO2 98% Objective:   Physical Exam  Constitutional: He is oriented to person, place, and time. He appears well-developed and well-nourished. No distress.  HENT:  Head: Normocephalic and atraumatic.  Eyes: No scleral icterus.  Neck: Normal range of motion. Neck supple. No spinous process tenderness and no muscular tenderness present. No rigidity. Normal range of motion present.  Pulmonary/Chest: Effort normal.    Musculoskeletal:       Cervical back: He exhibits pain. He exhibits normal range of motion, no tenderness, no bony tenderness and no spasm.       Thoracic back: He exhibits tenderness, pain and spasm.       Right hand: He exhibits normal range of motion and normal capillary refill. Decreased sensation noted. Decreased sensation is present in the radial distribution. Decreased strength noted. He exhibits finger abduction, thumb/finger opposition and wrist extension trouble.  Neurological: He is alert and oriented to person, place, and time. A sensory deficit is present. No cranial nerve deficit. Gait normal.  Reflex Scores:      Tricep reflexes are 1+ on the right side and 2+ on the left side.      Bicep reflexes are 2+ on the right side and 2+ on the left side.      Brachioradialis reflexes are 2+ on the right side and 2+ on the left side. Skin: Skin is warm and dry. He is not diaphoretic.  Psychiatric: He has a normal mood and affect. His behavior is normal.         [facility] reading (PRIMARY) by  Dr. Brigitte Pulse. C-spine xray: no acute abnormality - stat overread while pt in office seen below  EXAM: CERVICAL SPINE 4+ VIEWS  COMPARISON: No priors.  FINDINGS: Multiple views of the cervical spine demonstrate no acute displaced fractures. Alignment is anatomic. Prevertebral soft tissues are normal. Mild multilevel degenerative disc disease, most severe at C5-C6. Mild multilevel facet arthropathy.  IMPRESSION: 1. No acute radiographic abnormality of the cervical spine. 2. Mild multilevel degenerative disc disease and cervical spondylosis, as above.   Assessment & Plan:   Cervical pain (neck) - Plan: DG Cervical Spine Complete  Cervical radicular pain - Plan: methylPREDNISolone sodium succinate (SOLU-MEDROL) 125 mg/2 mL injection 125 mg, MR Cervical Spine Wo Contrast, Ambulatory referral to Neurosurgery - if pain, numbness, or weakness worsen o/n -> to ER. Recheck w/ me tomorrow after 1  p.m. to ensure sxs improving. Will plan on MRI of c-spine in 1-2d (Mon/Tues) w/ neurosurg eval asap. Suspect C6 nerve root impingement. Pt's pain still severe 1 hr after Soumedrol 125 IM and oxycodone 5 po so will try prn valium for muscle relaxant as well - do not combine - warned of potential for central resp depression.    Meds ordered this encounter  Medications  . oxyCODONE-acetaminophen (ROXICET) 5-325 MG per tablet    Sig: Take 1-2 tablets by mouth every 4 (four) hours as needed for severe pain.    Dispense:  60 tablet    Refill:  0  . methylPREDNISolone sodium succinate (SOLU-MEDROL) 125 mg/2 mL injection 125 mg    Sig:   . predniSONE (DELTASONE) 20 MG tablet    Sig: Take 3 tabs po qd x 2d, then 2 tabs po qd x 2d, then 1 tab po qd x 2d    Dispense:  12 tablet  Refill:  0  . diazepam (VALIUM) 5 MG tablet    Sig: Take 1 tablet (5 mg total) by mouth every 8 (eight) hours as needed for muscle spasms or sedation.    Dispense:  30 tablet    Refill:  0    Delman Cheadle, MD MPH

## 2013-06-20 NOTE — Patient Instructions (Signed)
Cervical Radiculopathy  Cervical radiculopathy happens when a nerve in the neck is pinched or bruised by a slipped (herniated) disk or by arthritic changes in the bones of the cervical spine. This can occur due to an injury or as part of the normal aging process. Pressure on the cervical nerves can cause pain or numbness that runs from your neck all the way down into your arm and fingers.  CAUSES   There are many possible causes, including:  · Injury.  · Muscle tightness in the neck from overuse.  · Swollen, painful joints (arthritis).  · Breakdown or degeneration in the bones and joints of the spine (spondylosis) due to aging.  · Bone spurs that may develop near the cervical nerves.  SYMPTOMS   Symptoms include pain, weakness, or numbness in the affected arm and hand. Pain can be severe or irritating. Symptoms may be worse when extending or turning the neck.  DIAGNOSIS   Your caregiver will ask about your symptoms and do a physical exam. He or she may test your strength and reflexes. X-rays, CT scans, and MRI scans may be needed in cases of injury or if the symptoms do not go away after a period of time. Electromyography (EMG) or nerve conduction testing may be done to study how your nerves and muscles are working.  TREATMENT   Your caregiver may recommend certain exercises to help relieve your symptoms. Cervical radiculopathy can, and often does, get better with time and treatment. If your problems continue, treatment options may include:  · Wearing a soft collar for short periods of time.  · Physical therapy to strengthen the neck muscles.  · Medicines, such as nonsteroidal anti-inflammatory drugs (NSAIDs), oral corticosteroids, or spinal injections.  · Surgery. Different types of surgery may be done depending on the cause of your problems.  HOME CARE INSTRUCTIONS   · Put ice on the affected area.  · Put ice in a plastic bag.  · Place a towel between your skin and the bag.  · Leave the ice on for 15-20 minutes,  03-04 times a day or as directed by your caregiver.  · If ice does not help, you can try using heat. Take a warm shower or bath, or use a hot water bottle as directed by your caregiver.  · You may try a gentle neck and shoulder massage.  · Use a flat pillow when you sleep.  · Only take over-the-counter or prescription medicines for pain, discomfort, or fever as directed by your caregiver.  · If physical therapy was prescribed, follow your caregiver's directions.  · If a soft collar was prescribed, use it as directed.  SEEK IMMEDIATE MEDICAL CARE IF:   · Your pain gets much worse and cannot be controlled with medicines.  · You have weakness or numbness in your hand, arm, face, or leg.  · You have a high fever or a stiff, rigid neck.  · You lose bowel or bladder control (incontinence).  · You have trouble with walking, balance, or speaking.  MAKE SURE YOU:   · Understand these instructions.  · Will watch your condition.  · Will get help right away if you are not doing well or get worse.  Document Released: 09/25/2000 Document Revised: 03/25/2011 Document Reviewed: 08/14/2010  Exit[employer]® Patient Information ©2014 [employer].

## 2013-06-21 ENCOUNTER — Telehealth: Payer: Self-pay | Admitting: Internal Medicine

## 2013-06-21 ENCOUNTER — Ambulatory Visit (INDEPENDENT_AMBULATORY_CARE_PROVIDER_SITE_OTHER): Payer: BC Managed Care – PPO | Admitting: Internal Medicine

## 2013-06-21 ENCOUNTER — Encounter: Payer: Self-pay | Admitting: Internal Medicine

## 2013-06-21 VITALS — BP 180/100 | HR 98 | Temp 98.2°F | Resp 20 | Ht 67.0 in | Wt 226.0 lb

## 2013-06-21 DIAGNOSIS — F909 Attention-deficit hyperactivity disorder, unspecified type: Secondary | ICD-10-CM

## 2013-06-21 DIAGNOSIS — M5412 Radiculopathy, cervical region: Secondary | ICD-10-CM

## 2013-06-21 DIAGNOSIS — N209 Urinary calculus, unspecified: Secondary | ICD-10-CM

## 2013-06-21 MED ORDER — LISINOPRIL-HYDROCHLOROTHIAZIDE 20-25 MG PO TABS: 1.0000 | ORAL_TABLET | Freq: Every day | ORAL | Status: DC

## 2013-06-21 MED ORDER — AMLODIPINE BESY-BENAZEPRIL HCL 5-20 MG PO CAPS: 1.0000 | ORAL_CAPSULE | Freq: Every day | ORAL | Status: DC

## 2013-06-21 NOTE — Telephone Encounter (Signed)
Dr. K, please see message and advise. 

## 2013-06-21 NOTE — Patient Instructions (Signed)
Cervical MRI as scheduled  Limit your sodium (Salt) intake  Please check your blood pressure on a regular basis.  If it is consistently greater than 150/90, please make an office appointment.  Return in one week for followup

## 2013-06-21 NOTE — Telephone Encounter (Signed)
Switch to generic, Lotrel 5/20  , #30.  1 daily

## 2013-06-21 NOTE — Telephone Encounter (Signed)
Patient Information:  Caller Name: Seth Bake  Phone: [phone]  Patient: Adam Terry, Adam Terry  Gender: Male  DOB: 01-Dec-1975  Age: 38 Years  PCP: Bluford Kaufmann [facility])  Office Follow Up:  Does the office need to follow up with this patient?: Yes  Instructions For The Office: Please verify if patient is to take Lisinopril/HCTZ as ordered of if an alternate medication is recommended.  Spouse can be reached at [phone].  RN Note:  Margaretha Seeds is calling. States patient was seen in the office 06/21/13 by Dr. Burnice Logan and perscribed Lisinopril/HCTZ. Spouse states the literature provided with the Rx states not to take the medication if patient is allergic to Sulfa. Spouse states patient is "highly" allergic to Sulfa with sx of hives and throat swelling. Spouse states patient did not take the medication. RN spoke with Pharmacist/Chad at [facility] on [address] at [phone]. Pharmacist states that many people with Sulfa allergies are able to tolerate HCTZ without problems. Pharmacist states he will credit the medication if the Rx is changed. Please verify if patient is to take Lisinopril/HCTZ as ordered of if an alternate medication is recommended.  Spouse can be reached at [phone].  Symptoms  Reason For Call & Symptoms: Medication Concern  Reviewed Health History In EMR: Yes  Reviewed Medications In EMR: Yes  Reviewed Allergies In EMR: Yes  Reviewed Surgeries / Procedures: Yes  Date of Onset of Symptoms: 06/21/2013  Guideline(s) Used:  No Protocol Available - Information Only  Disposition Per Guideline:   Discuss with PCP and Callback by Nurse Today  Reason For Disposition Reached:   Nursing judgment  Advice Given:  N/A  Patient Will Follow Care Advice:  YES

## 2013-06-21 NOTE — Progress Notes (Signed)
Pre-visit discussion using our clinic review tool. No additional management support is needed unless otherwise documented below in the visit note.  

## 2013-06-21 NOTE — Telephone Encounter (Signed)
Spoke to pt's wife Seth Bake, told her Dr. Raliegh Ip is changing Rx to Lotrel 5/20 and new Rx sent to pharmacy. Seth Bake verbalized understanding.

## 2013-06-22 ENCOUNTER — Encounter: Payer: Self-pay | Admitting: Internal Medicine

## 2013-06-22 NOTE — Progress Notes (Signed)
Subjective:    Patient ID: Adam Terry, male    DOB: 03-27-1975, 38 y.o.   MRN: [id number]  HPI  38 year old patient who has a history of ADHD, as well as nephrolithiasis.  The past few weeks.  He has had worsening shoulder pain.  This has been associated with paresthesias involving his right first through third fingers.  He is right-handed and also feels that his right grip strength has diminished.  He was seen in the ED recently and radiographs were unremarkable.  Pain has increased since yesterday morning.  ED records and x-rays reviewed.  He is completing a prednisone therapy and also was prescribed oxycodone and diazepam    Past Medical History  Diagnosis Date  . ALLERGIC RHINITIS 09/17/2007  . NEPHROLITHIASIS, HX OF 10/20/2007  . STONE, URINARY CALCULUS,UNSPEC. 09/17/2007  . ADHD (attention deficit hyperactivity disorder)   . Allergy     History   Social History  . Marital Status: Married    Spouse Name: N/A    Number of Children: N/A  . Years of Education: N/A   Occupational History  . Not on file.   Social History Main Topics  . Smoking status: Never Smoker   . Smokeless tobacco: Never Used  . Alcohol Use: No  . Drug Use: No  . Sexual Activity: Not on file   Other Topics Concern  . Not on file   Social History Narrative  . No narrative on file    Past Surgical History  Procedure Laterality Date  . Tonsillectomy      Family History  Problem Relation Age of Onset  . Urolithiasis Mother   . Leukemia Maternal Uncle   . Heart disease Maternal Grandmother     Allergies  Allergen Reactions  . Penicillins     REACTION: hives, throat closes  . Sulfamethoxazole     REACTION: hives, throat closes    Current Outpatient Prescriptions on File Prior to Visit  Medication Sig Dispense Refill  . amphetamine-dextroamphetamine (ADDERALL XR) 20 MG 24 hr capsule Take 1 capsule (20 mg total) by mouth daily.  30 capsule  0  . amphetamine-dextroamphetamine (ADDERALL XR)  20 MG 24 hr capsule Take 1 capsule (20 mg total) by mouth every morning.  30 capsule  0  . amphetamine-dextroamphetamine (ADDERALL XR) 20 MG 24 hr capsule Take 1 capsule (20 mg total) by mouth every morning.  30 capsule  0  . aspirin-acetaminophen-caffeine (EXCEDRIN MIGRAINE) [id number] MG per tablet Take 1 tablet by mouth every 6 (six) hours as needed for headache.      . diazepam (VALIUM) 5 MG tablet Take 1 tablet (5 mg total) by mouth every 8 (eight) hours as needed for muscle spasms or sedation.  30 tablet  0  . levocetirizine (XYZAL) 5 MG tablet Take 1 tablet (5 mg total) by mouth daily.  60 tablet  4  . oxyCODONE-acetaminophen (ROXICET) 5-325 MG per tablet Take 1-2 tablets by mouth every 4 (four) hours as needed for severe pain.  60 tablet  0  . predniSONE (DELTASONE) 20 MG tablet Take 3 tabs po qd x 2d, then 2 tabs po qd x 2d, then 1 tab po qd x 2d  12 tablet  0   No current facility-administered medications on file prior to visit.    BP 180/100  Pulse 98  Temp(Src) 98.2 F (36.8 C) (Oral)  Resp 20  Ht 5\' 7"  (1.702 m)  Wt 226 lb (102.513 kg)  BMI 35.39 kg/m2  SpO2 98%        Review of Systems  Constitutional: Negative for fever, chills, appetite change and fatigue.  HENT: Negative for congestion, dental problem, ear pain, hearing loss, sore throat, tinnitus, trouble swallowing and voice change.   Eyes: Negative for pain, discharge and visual disturbance.  Respiratory: Negative for cough, chest tightness, wheezing and stridor.   Cardiovascular: Negative for chest pain, palpitations and leg swelling.  Gastrointestinal: Negative for nausea, vomiting, abdominal pain, diarrhea, constipation, blood in stool and abdominal distention.  Genitourinary: Negative for urgency, hematuria, flank pain, discharge, difficulty urinating and genital sores.  Musculoskeletal: Negative for arthralgias, back pain, gait problem, joint swelling, myalgias and neck stiffness.       Right shoulder pain   Skin: Negative for rash.  Neurological: Positive for numbness. Negative for dizziness, syncope, speech difficulty, weakness and headaches.  Hematological: Negative for adenopathy. Does not bruise/bleed easily.  Psychiatric/Behavioral: Negative for behavioral problems and dysphoric mood. The patient is not nervous/anxious.        Objective:   Physical Exam  Constitutional: He appears well-developed and well-nourished. No distress.  Musculoskeletal:  Full range of motion right shoulder No muscle atrophy  Neurological:  Reflexes were generally quite brisk Diminished right triceps reflex Mildly diminished right grip strength and right arm extension          Assessment & Plan:  Right shoulder pain/cervical radiculopathy.  We'll maintain the patient on his present regimen.  We'll schedule cervical MRI.  Likely will need neurosurgical referral.  Due to worsening pain and early neurological deficits.  MRI scheduled

## 2013-06-25 ENCOUNTER — Other Ambulatory Visit: Payer: BC Managed Care – PPO

## 2013-06-26 ENCOUNTER — Ambulatory Visit
Admission: RE | Admit: 2013-06-26 | Discharge: 2013-06-26 | Disposition: A | Discharge status: Home / Self Care | Payer: BC Managed Care – PPO | Source: Ambulatory Visit | Attending: Family Medicine | Admitting: Family Medicine

## 2013-06-26 ENCOUNTER — Inpatient Hospital Stay: Admission: RE | Admit: 2013-06-26 | Payer: BC Managed Care – PPO | Source: Ambulatory Visit

## 2013-06-26 DIAGNOSIS — M5412 Radiculopathy, cervical region: Secondary | ICD-10-CM

## 2013-06-28 ENCOUNTER — Telehealth: Payer: Self-pay | Admitting: Internal Medicine

## 2013-06-28 DIAGNOSIS — M5412 Radiculopathy, cervical region: Secondary | ICD-10-CM

## 2013-06-28 NOTE — Telephone Encounter (Signed)
Please schedule neurosurgical referral

## 2013-06-28 NOTE — Telephone Encounter (Signed)
Pt has mri done at [facility]

## 2013-06-28 NOTE — Telephone Encounter (Signed)
Pt would like a call about his MRI results . Mri was done on 06/26/13 at [facility]

## 2013-06-29 NOTE — Telephone Encounter (Signed)
Spoke to pt told him Dr Raliegh Ip is referring him to Neurosurgeon and someone will be contacting him for an appointment. Explained MRI findings to pt. Pt verbalized understanding. Order for referral done and Hilda Blades away urgent referral.

## 2013-10-06 ENCOUNTER — Telehealth: Payer: Self-pay | Admitting: Internal Medicine

## 2013-10-06 NOTE — Telephone Encounter (Signed)
Pt needs re-fill on amphetamine-dextroamphetamine (ADDERALL XR) 20 MG 24 hr capsule ° °

## 2013-10-07 MED ORDER — AMPHETAMINE-DEXTROAMPHET ER 20 MG PO CP24: 20.0000 mg | ORAL_CAPSULE | ORAL | Status: DC

## 2013-10-07 MED ORDER — AMPHETAMINE-DEXTROAMPHET ER 20 MG PO CP24: 20.0000 mg | ORAL_CAPSULE | Freq: Every day | ORAL | Status: DC

## 2013-10-07 NOTE — Telephone Encounter (Signed)
Left detailed message Rx's ready for pickup. Rx's printed and signed. 

## 2014-03-16 ENCOUNTER — Telehealth: Payer: Self-pay | Admitting: Internal Medicine

## 2014-03-16 MED ORDER — AMPHETAMINE-DEXTROAMPHET ER 20 MG PO CP24: 20.0000 mg | ORAL_CAPSULE | ORAL | Status: DC

## 2014-03-16 NOTE — Telephone Encounter (Signed)
° ° ° °

## 2014-03-16 NOTE — Telephone Encounter (Signed)
Pt notified Rx ready for pickup. Rx printed and signed.  

## 2014-03-16 NOTE — Addendum Note (Signed)
Addended by: Marian Sorrow on: 03/16/2014 12:10 PM   Modules accepted: Orders

## 2014-06-03 ENCOUNTER — Other Ambulatory Visit (INDEPENDENT_AMBULATORY_CARE_PROVIDER_SITE_OTHER): Payer: BLUE CROSS/BLUE SHIELD

## 2014-06-03 DIAGNOSIS — Z0000 Encounter for general adult medical examination without abnormal findings: Secondary | ICD-10-CM | POA: Diagnosis not present

## 2014-06-03 LAB — HEPATIC FUNCTION PANEL
ALT: 24 U/L (ref 0–53)
AST: 21 U/L (ref 0–37)
Albumin: 4.6 g/dL (ref 3.5–5.2)
Alkaline Phosphatase: 70 U/L (ref 39–117)
Bilirubin, Direct: 0.1 mg/dL (ref 0.0–0.3)
Total Bilirubin: 0.6 mg/dL (ref 0.2–1.2)
Total Protein: 7.8 g/dL (ref 6.0–8.3)

## 2014-06-03 LAB — CBC WITH DIFFERENTIAL/PLATELET
Basophils Absolute: 0 10*3/uL (ref 0.0–0.1)
Basophils Relative: 0.5 % (ref 0.0–3.0)
Eosinophils Absolute: 0.1 10*3/uL (ref 0.0–0.7)
Eosinophils Relative: 1.9 % (ref 0.0–5.0)
HCT: 45 % (ref 39.0–52.0)
Hemoglobin: 15.6 g/dL (ref 13.0–17.0)
Lymphocytes Relative: 22.6 % (ref 12.0–46.0)
Lymphs Abs: 1.4 10*3/uL (ref 0.7–4.0)
MCHC: 34.6 g/dL (ref 30.0–36.0)
MCV: 89.7 fl (ref 78.0–100.0)
Monocytes Absolute: 0.5 10*3/uL (ref 0.1–1.0)
Monocytes Relative: 8 % (ref 3.0–12.0)
Neutro Abs: 4.2 10*3/uL (ref 1.4–7.7)
Neutrophils Relative %: 67 % (ref 43.0–77.0)
Platelets: 221 10*3/uL (ref 150.0–400.0)
RBC: 5.02 Mil/uL (ref 4.22–5.81)
RDW: 13.6 % (ref 11.5–15.5)
WBC: 6.2 10*3/uL (ref 4.0–10.5)

## 2014-06-03 LAB — BASIC METABOLIC PANEL
BUN: 11 mg/dL (ref 6–23)
CO2: 28 mEq/L (ref 19–32)
Calcium: 9.7 mg/dL (ref 8.4–10.5)
Chloride: 103 mEq/L (ref 96–112)
Creatinine, Ser: 1.13 mg/dL (ref 0.40–1.50)
GFR: 76.08 mL/min (ref >60.00)
Glucose, Bld: 95 mg/dL (ref 70–99)
Potassium: 4.5 mEq/L (ref 3.5–5.1)
Sodium: 139 mEq/L (ref 135–145)

## 2014-06-03 LAB — POCT URINALYSIS DIPSTICK
Bilirubin, UA: NEGATIVE
Blood, UA: NEGATIVE
Glucose, UA: NEGATIVE
Ketones, UA: NEGATIVE
Leukocytes, UA: NEGATIVE
Nitrite, UA: NEGATIVE
Protein, UA: NEGATIVE
Spec Grav, UA: 1.015
Urobilinogen, UA: 0.2
pH, UA: 7

## 2014-06-03 LAB — LIPID PANEL
Cholesterol: 172 mg/dL (ref 0–200)
HDL: 36.4 mg/dL — ABNORMAL LOW (ref >39.00)
LDL Cholesterol: 117 mg/dL — ABNORMAL HIGH (ref 0–99)
NonHDL: 135.6
Total CHOL/HDL Ratio: 5
Triglycerides: 92 mg/dL (ref 0.0–149.0)
VLDL: 18.4 mg/dL (ref 0.0–40.0)

## 2014-06-03 LAB — TSH: TSH: 1.64 u[IU]/mL (ref 0.35–4.50)

## 2014-06-10 ENCOUNTER — Encounter: Payer: Self-pay | Admitting: Internal Medicine

## 2014-06-10 ENCOUNTER — Ambulatory Visit (INDEPENDENT_AMBULATORY_CARE_PROVIDER_SITE_OTHER): Payer: BLUE CROSS/BLUE SHIELD | Admitting: Internal Medicine

## 2014-06-10 VITALS — BP 168/100 | HR 84 | Temp 98.3°F | Resp 20 | Ht 67.0 in | Wt 215.0 lb

## 2014-06-10 DIAGNOSIS — Z0000 Encounter for general adult medical examination without abnormal findings: Secondary | ICD-10-CM

## 2014-06-10 MED ORDER — AMLODIPINE BESY-BENAZEPRIL HCL 5-20 MG PO CAPS: 1.0000 | ORAL_CAPSULE | Freq: Every day | ORAL | Status: DC

## 2014-06-10 NOTE — Progress Notes (Signed)
Pre visit review using our clinic review tool, if applicable. No additional management support is needed unless otherwise documented below in the visit note. 

## 2014-06-10 NOTE — Patient Instructions (Signed)
Limit your sodium (Salt) intake    It is important that you exercise regularly, at least 20 minutes 3 to 4 times per week.  If you develop chest pain or shortness of breath seek  medical attention.  You need to lose weight.  Consider a lower calorie diet and regular exercise.  Please check your blood pressure on a regular basis.  If it is consistently greater than 150/90, please make an office appointment.  DASH Eating Plan DASH stands for "Dietary Approaches to Stop Hypertension." The DASH eating plan is a healthy eating plan that has been shown to reduce high blood pressure (hypertension). Additional health benefits may include reducing the risk of type 2 diabetes mellitus, heart disease, and stroke. The DASH eating plan may also help with weight loss. WHAT DO I NEED TO KNOW ABOUT THE DASH EATING PLAN? For the DASH eating plan, you will follow these general guidelines:  Choose foods with a percent daily value for sodium of less than 5% (as listed on the food label).  Use salt-free seasonings or herbs instead of table salt or sea salt.  Check with your health care provider or pharmacist before using salt substitutes.  Eat lower-sodium products, often labeled as "lower sodium" or "no salt added."  Eat fresh foods.  Eat more vegetables, fruits, and low-fat dairy products.  Choose whole grains. Look for the word "whole" as the first word in the ingredient list.  Choose fish and skinless chicken or [place] more often than red meat. Limit fish, poultry, and meat to 6 oz (170 g) each day.  Limit sweets, desserts, sugars, and sugary drinks.  Choose heart-healthy fats.  Limit cheese to 1 oz (28 g) per day.  Eat more home-cooked food and less restaurant, buffet, and fast food.  Limit fried foods.  Cook foods using methods other than frying.  Limit canned vegetables. If you do use them, rinse them well to decrease the sodium.  When eating at a restaurant, ask that your food be  prepared with less salt, or no salt if possible. WHAT FOODS CAN I EAT? Seek help from a dietitian for individual calorie needs. Grains Whole grain or whole wheat bread. Brown rice. Whole grain or whole wheat pasta. Quinoa, bulgur, and whole grain cereals. Low-sodium cereals. Corn or whole wheat flour tortillas. Whole grain cornbread. Whole grain crackers. Low-sodium crackers. Vegetables Fresh or frozen vegetables (raw, steamed, roasted, or grilled). Low-sodium or reduced-sodium tomato and vegetable juices. Low-sodium or reduced-sodium tomato sauce and paste. Low-sodium or reduced-sodium canned vegetables.  Fruits All fresh, canned (in natural juice), or frozen fruits. Meat and Other Protein Products Ground beef (85% or leaner), grass-fed beef, or beef trimmed of fat. Skinless chicken or [place]. Ground chicken or [place]. Pork trimmed of fat. All fish and seafood. Eggs. Dried beans, peas, or lentils. Unsalted nuts and seeds. Unsalted canned beans. Dairy Low-fat dairy products, such as skim or 1% milk, 2% or reduced-fat cheeses, low-fat ricotta or cottage cheese, or plain low-fat yogurt. Low-sodium or reduced-sodium cheeses. Fats and Oils Tub margarines without trans fats. Light or reduced-fat mayonnaise and salad dressings (reduced sodium). Avocado. Safflower, olive, or canola oils. Natural peanut or almond butter. Other Unsalted popcorn and pretzels. The items listed above may not be a complete list of recommended foods or beverages. Contact your dietitian for more options. WHAT FOODS ARE NOT RECOMMENDED? Grains White bread. White pasta. White rice. Refined cornbread. Bagels and croissants. Crackers that contain trans fat. Vegetables Creamed or fried vegetables. Vegetables  in a cheese sauce. Regular canned vegetables. Regular canned tomato sauce and paste. Regular tomato and vegetable juices. Fruits Dried fruits. Canned fruit in light or heavy syrup. Fruit juice. Meat and Other Protein  Products Fatty cuts of meat. Ribs, chicken wings, bacon, sausage, bologna, salami, chitterlings, fatback, hot dogs, bratwurst, and packaged luncheon meats. Salted nuts and seeds. Canned beans with salt. Dairy Whole or 2% milk, cream, half-and-half, and cream cheese. Whole-fat or sweetened yogurt. Full-fat cheeses or blue cheese. Nondairy creamers and whipped toppings. Processed cheese, cheese spreads, or cheese curds. Condiments Onion and garlic salt, seasoned salt, table salt, and sea salt. Canned and packaged gravies. Worcestershire sauce. Tartar sauce. Barbecue sauce. Teriyaki sauce. Soy sauce, including reduced sodium. Steak sauce. Fish sauce. Oyster sauce. Cocktail sauce. Horseradish. Ketchup and mustard. Meat flavorings and tenderizers. Bouillon cubes. Hot sauce. Tabasco sauce. Marinades. Taco seasonings. Relishes. Fats and Oils Butter, stick margarine, lard, shortening, ghee, and bacon fat. Coconut, palm kernel, or palm oils. Regular salad dressings. Other Pickles and olives. Salted popcorn and pretzels. The items listed above may not be a complete list of foods and beverages to avoid. Contact your dietitian for more information. WHERE CAN I FIND MORE INFORMATION? National Heart, Lung, and Blood Institute: travelstabloid.com Document Released: 12/20/2010 Document Revised: 05/17/2013 Document Reviewed: 11/04/2012 [facility] Patient Information 2015 [place], [place]. This information is not intended to replace advice given to you by your health care provider. Make sure you discuss any questions you have with your health care provider. Health Maintenance A healthy lifestyle and preventative care can promote health and wellness.  Maintain regular health, dental, and eye exams.  Eat a healthy diet. Foods like vegetables, fruits, whole grains, low-fat dairy products, and lean protein foods contain the nutrients you need and are low in calories. Decrease your intake  of foods high in solid fats, added sugars, and salt. Get information about a proper diet from your health care provider, if necessary.  Regular physical exercise is one of the most important things you can do for your health. Most adults should get at least 150 minutes of moderate-intensity exercise (any activity that increases your heart rate and causes you to sweat) each week. In addition, most adults need muscle-strengthening exercises on 2 or more days a week.   Maintain a healthy weight. The body mass index (BMI) is a screening tool to identify possible weight problems. It provides an estimate of body fat based on height and weight. Your health care provider can find your BMI and can help you achieve or maintain a healthy weight. For males 20 years and older:  A BMI below 18.5 is considered underweight.  A BMI of 18.5 to 24.9 is normal.  A BMI of 25 to 29.9 is considered overweight.  A BMI of 30 and above is considered obese.  Maintain normal blood lipids and cholesterol by exercising and minimizing your intake of saturated fat. Eat a balanced diet with plenty of fruits and vegetables. Blood tests for lipids and cholesterol should begin at age 44 and be repeated every 5 years. If your lipid or cholesterol levels are high, you are over age 83, or you are at high risk for heart disease, you may need your cholesterol levels checked more frequently.Ongoing high lipid and cholesterol levels should be treated with medicines if diet and exercise are not working.  If you smoke, find out from your health care provider how to quit. If you do not use tobacco, do not start.  Lung cancer  screening is recommended for adults aged 65-80 years who are at high risk for developing lung cancer because of a history of smoking. A yearly low-dose CT scan of the lungs is recommended for people who have at least a 30-pack-year history of smoking and are current smokers or have quit within the past 15 years. A pack  year of smoking is smoking an average of 1 pack of cigarettes a day for 1 year (for example, a 30-pack-year history of smoking could mean smoking 1 pack a day for 30 years or 2 packs a day for 15 years). Yearly screening should continue until the smoker has stopped smoking for at least 15 years. Yearly screening should be stopped for people who develop a health problem that would prevent them from having lung cancer treatment.  If you choose to drink alcohol, do not have more than 2 drinks per day. One drink is considered to be 12 oz (360 mL) of beer, 5 oz (150 mL) of wine, or 1.5 oz (45 mL) of liquor.  Avoid the use of street drugs. Do not share needles with anyone. Ask for help if you need support or instructions about stopping the use of drugs.  High blood pressure causes heart disease and increases the risk of stroke. Blood pressure should be checked at least every 1-2 years. Ongoing high blood pressure should be treated with medicines if weight loss and exercise are not effective.  If you are 40-24 years old, ask your health care provider if you should take aspirin to prevent heart disease.  Diabetes screening involves taking a blood sample to check your fasting blood sugar level. This should be done once every 3 years after age 9 if you are at a normal weight and without risk factors for diabetes. Testing should be considered at a younger age or be carried out more frequently if you are overweight and have at least 1 risk factor for diabetes.  Colorectal cancer can be detected and often prevented. Most routine colorectal cancer screening begins at the age of 83 and continues through age 92. However, your health care provider may recommend screening at an earlier age if you have risk factors for colon cancer. On a yearly basis, your health care provider may provide home test kits to check for hidden blood in the stool. A small camera at the end of a tube may be used to directly examine the colon  (sigmoidoscopy or colonoscopy) to detect the earliest forms of colorectal cancer. Talk to your health care provider about this at age 40 when routine screening begins. A direct exam of the colon should be repeated every 5-10 years through age 32, unless early forms of precancerous polyps or small growths are found.  People who are at an increased risk for hepatitis B should be screened for this virus. You are considered at high risk for hepatitis B if:  You were born in a country where hepatitis B occurs often. Talk with your health care provider about which countries are considered high risk.  Your parents were born in a high-risk country and you have not received a shot to protect against hepatitis B (hepatitis B vaccine).  You have HIV or AIDS.  You use needles to inject street drugs.  You live with, or have sex with, someone who has hepatitis B.  You are a man who has sex with other men (MSM).  You get hemodialysis treatment.  You take certain medicines for conditions like cancer, organ transplantation,  and autoimmune conditions.  Hepatitis C blood testing is recommended for all people born from [birth year] through 1967 and any individual with known risk factors for hepatitis C.  Healthy men should no longer receive prostate-specific antigen (PSA) blood tests as part of routine cancer screening. Talk to your health care provider about prostate cancer screening.  Testicular cancer screening is not recommended for adolescents or adult males who have no symptoms. Screening includes self-exam, a health care provider exam, and other screening tests. Consult with your health care provider about any symptoms you have or any concerns you have about testicular cancer.  Practice safe sex. Use condoms and avoid high-risk sexual practices to reduce the spread of sexually transmitted infections (STIs).  You should be screened for STIs, including gonorrhea and chlamydia if:  You are sexually active and  are younger than 24 years.  You are older than 24 years, and your health care provider tells you that you are at risk for this type of infection.  Your sexual activity has changed since you were last screened, and you are at an increased risk for chlamydia or gonorrhea. Ask your health care provider if you are at risk.  If you are at risk of being infected with HIV, it is recommended that you take a prescription medicine daily to prevent HIV infection. This is called pre-exposure prophylaxis (PrEP). You are considered at risk if:  You are a man who has sex with other men (MSM).  You are a heterosexual man who is sexually active with multiple partners.  You take drugs by injection.  You are sexually active with a partner who has HIV.  Talk with your health care provider about whether you are at high risk of being infected with HIV. If you choose to begin PrEP, you should first be tested for HIV. You should then be tested every 3 months for as long as you are taking PrEP.  Use sunscreen. Apply sunscreen liberally and repeatedly throughout the day. You should seek shade when your shadow is shorter than you. Protect yourself by wearing long sleeves, pants, a wide-brimmed hat, and sunglasses year round whenever you are outdoors.  Tell your health care provider of new moles or changes in moles, especially if there is a change in shape or color. Also, tell your health care provider if a mole is larger than the size of a pencil eraser.  A one-time screening for abdominal aortic aneurysm (AAA) and surgical repair of large AAAs by ultrasound is recommended for men aged 72-75 years who are current or former smokers.  Stay current with your vaccines (immunizations). Document Released: 06/29/2007 Document Revised: 01/05/2013 Document Reviewed: 05/28/2010 [facility] Patient Information 2015 [place], [place]. This information is not intended to replace advice given to you by your health care provider. Make  sure you discuss any questions you have with your health care provider.

## 2014-06-10 NOTE — Progress Notes (Signed)
Subjective:    Patient ID: Adam Terry, male    DOB: 06/15/1975, 39 y.o.   MRN: [id number]  HPI  Patient ID: Adam Terry, male   DOB: [birth year], 39 y.o.   MRN: [id number]  Subjective:    Patient ID: Adam Terry, male    DOB: Feb 01, 1975, 39 y.o.   MRN: [id number]  HPI  Pre-visit discussion using our clinic review tool. No additional management support is needed unless otherwise documented below in the visit note.   39 -year-old patient who is seen today for a wellness exam.  He has a history of allergic rhinitis;  he continues to receive immunotherapy.  He has a history of nephrolithiasis which has been stable. He has a history also of ADHD which has been well controlled with Adderall. Over the past year.  He has been followed intermittently by [place] [facility].  He has required some occasional epidurals with benefit.  He has some mild paresthesias of the right hand but in general doing fairly well He has a history of hypertension but has self discontinued his blood pressure medication.  He states that he monitors home blood pressure readings frequently with systolics in the 570 to 1:77 range and diastolics in the mid to high eighties   Wt Readings from Last 3 Encounters:  06/21/13 226 lb (102.513 kg)  06/20/13 234 lb 6.4 oz (106.323 kg)  11/18/12 217 lb (98.431 kg)    Current Allergies:   ! SULFAMETHOXAZOLE (SULFAMETHOXAZOLE)  ! PENICILLIN V POTASSIUM (PENICILLIN V POTASSIUM)   Past Medical History:   Allergic rhinitis  calcium oxalate kidney stones  Nephrolithiasis, hx of  ADHD   Past Surgical History:   Tonsillectomy   Family History:  Father- HTN Mother - kidney stone DM2 HLD both parents good health. 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 ALLERGIC RHINITIS 09/17/2007  . NEPHROLITHIASIS, HX OF 10/20/2007  . STONE, URINARY CALCULUS,UNSPEC. 09/17/2007  . ADHD (attention deficit hyperactivity disorder)   . Allergy     History   Social History  . Marital Status: Married    Spouse Name: N/A  . Number of Children: N/A  . Years of Education: N/A   Occupational History  . Not on file.   Social History Main Topics  . Smoking status: Never Smoker   . Smokeless tobacco: Never Used  . Alcohol Use: No  . Drug Use: No  . Sexual Activity: Not on file   Other Topics Concern  . Not on file   Social History Narrative  . No narrative on file    Past Surgical History  Procedure Laterality Date  . Tonsillectomy      Family History  Problem Relation Age of Onset  . Urolithiasis Mother   . Leukemia Maternal Uncle   . Heart disease Maternal Grandmother     Allergies  Allergen Reactions  . Penicillins     REACTION: hives, throat closes  . Sulfamethoxazole     REACTION: hives, throat closes    Current Outpatient Prescriptions on File Prior to Visit  Medication Sig Dispense Refill  . amphetamine-dextroamphetamine (ADDERALL XR) 20 MG 24 hr  capsule Take 1 capsule (20 mg total) by mouth every morning. 30 capsule 0  . amphetamine-dextroamphetamine (ADDERALL XR) 20 MG 24 hr capsule Take 1 capsule (20 mg total) by mouth every morning. 30 capsule 0  . amphetamine-dextroamphetamine (ADDERALL XR) 20 MG 24 hr capsule Take 1 capsule (20 mg total) by mouth every morning. 30 capsule 0  . aspirin-acetaminophen-caffeine (EXCEDRIN MIGRAINE) [phone] MG per tablet Take 1 tablet by mouth every 6 (six) hours as needed for headache.    . levocetirizine (XYZAL) 5 MG tablet Take 1 tablet (5 mg total) by mouth daily. 60 tablet 4   No current facility-administered medications on file prior to  visit.    There were no vitals taken for this visit.     Review of Systems  Constitutional: Negative for fever, chills, activity change, appetite change and fatigue.  HENT: Negative for congestion, dental problem, ear pain, hearing loss, mouth sores, rhinorrhea, sinus pressure, sneezing, tinnitus, trouble swallowing and voice change.   Eyes: Negative for photophobia, pain, redness and visual disturbance.  Respiratory: Negative for apnea, cough, choking, chest tightness, shortness of breath and wheezing.   Cardiovascular: Negative for chest pain, palpitations and leg swelling.  Gastrointestinal: Negative for nausea, vomiting, abdominal pain, diarrhea, constipation, blood in stool, abdominal distention, anal bleeding and rectal pain.  Genitourinary: Negative for dysuria, urgency, frequency, hematuria, flank pain, decreased urine volume, discharge, penile swelling, scrotal swelling, difficulty urinating, genital sores and testicular pain.  Musculoskeletal: Negative for arthralgias, back pain, gait problem, joint swelling, myalgias, neck pain and neck stiffness.   Skin: Negative for color change, rash and wound.  Neurological: Negative for dizziness, tremors, seizures, syncope, facial asymmetry, speech difficulty, weakness, light-headedness, numbness and headaches.  Hematological: Negative for adenopathy. Does not bruise/bleed easily.  Psychiatric/Behavioral: Negative for suicidal ideas, hallucinations, behavioral problems, confusion, sleep disturbance, self-injury, dysphoric mood, decreased concentration and agitation. The patient is not nervous/anxious.        Objective:   Physical Exam  Constitutional: He appears well-developed and well-nourished.  HENT:  Head: Normocephalic and atraumatic.  Right Ear: External ear normal.  Left Ear: External ear normal.  Nose: Nose normal.  Mouth/Throat: Oropharynx is clear and moist.  Eyes: Conjunctivae and EOM are normal. Pupils are equal, round,  and reactive to light. No scleral icterus.  Neck: Normal range of motion. Neck supple. No JVD present. No thyromegaly present.  Cardiovascular: Regular rhythm, normal heart sounds and intact distal pulses.  Exam reveals no gallop and no friction rub.   No murmur heard. Pulmonary/Chest: Effort normal and breath sounds normal. He exhibits no tenderness.  Abdominal: Soft. Bowel sounds are normal. He exhibits no distension and no mass. There is no tenderness.  Genitourinary: Penis normal.  Musculoskeletal: Normal range of motion. He exhibits no edema and no tenderness.   Lymphadenopathy:    He has no cervical adenopathy.  Neurological: He is alert. He has normal reflexes. No cranial nerve deficit. Coordination normal.  Skin: Skin is warm and dry. No rash noted.  Psychiatric: He has a normal mood and affect. His behavior is normal.          Assessment & Plan:   Preventive health examination  Exogenous obesity. Diet weight loss encouraged ADHD stable we'll continue present regimen Nephrolithiasis. Asymptomatic.   Recheck one year   Review of Systems    as above Objective:   Physical Exam  Blood pressure 150/100      Assessment & Plan:   Preventive health examination Hypertension, poor control.  Nonpharmacologic management discussed.  Information concerning-diet, dispensed weight loss exercise all encouraged.  Will resume antihypertensive medication.  Continue home blood pressure monitoring

## 2014-07-22 ENCOUNTER — Other Ambulatory Visit: Payer: Self-pay | Admitting: Internal Medicine

## 2014-07-22 MED ORDER — AMPHETAMINE-DEXTROAMPHET ER 20 MG PO CP24: 20.0000 mg | ORAL_CAPSULE | ORAL | Status: DC

## 2014-07-22 NOTE — Telephone Encounter (Signed)
Pt needs a refill on his Adderall medications.

## 2014-07-22 NOTE — Telephone Encounter (Signed)
Pt notified Rx ready for pickup. Rx printed and signed.  

## 2014-12-15 ENCOUNTER — Telehealth: Payer: Self-pay | Admitting: Internal Medicine

## 2014-12-15 NOTE — Telephone Encounter (Signed)
FYI; Home care given

## 2014-12-15 NOTE — Telephone Encounter (Signed)
Patient Name: Adam Terry DOB: 1975/01/27 Initial Comment Caller states has an cough; Nurse Assessment Nurse: Roosvelt Maser, RN, Barnetta Chapel Date/Time (Eastern Time): 12/15/2014 11:15:19 AM Confirm and document reason for call. If symptomatic, describe symptoms. ---caller states he has had a dry nagging cough for past five days. no fever, no congestion. Has the patient traveled out of the country within the last 30 days? ---Not Applicable Does the patient have any new or worsening symptoms? ---Yes Will a triage be completed? ---Yes Related visit to physician within the last 2 weeks? ---No Does the PT have any chronic conditions? (i.e. diabetes, asthma, etc.) ---Yes List chronic conditions. ---seasonal allergies (under control) HTN, asthma (uses inhaler as needed) Is this a behavioral health call? ---No Guidelines Guideline Title Affirmed Question Affirmed Notes Cough - Acute Non-Productive Cough (all triage questions negative) Final Disposition User Hampton, RN, Barnetta Chapel Disagree/Comply: Comply

## 2014-12-16 ENCOUNTER — Encounter: Payer: Self-pay | Admitting: Family Medicine

## 2014-12-16 ENCOUNTER — Ambulatory Visit (INDEPENDENT_AMBULATORY_CARE_PROVIDER_SITE_OTHER): Payer: BLUE CROSS/BLUE SHIELD | Admitting: Family Medicine

## 2014-12-16 VITALS — BP 133/89 | HR 76 | Temp 97.9°F | Resp 14 | Ht 67.0 in | Wt 209.0 lb

## 2014-12-16 DIAGNOSIS — B9789 Other viral agents as the cause of diseases classified elsewhere: Principal | ICD-10-CM

## 2014-12-16 DIAGNOSIS — J069 Acute upper respiratory infection, unspecified: Secondary | ICD-10-CM | POA: Diagnosis not present

## 2014-12-16 MED ORDER — LEVOCETIRIZINE DIHYDROCHLORIDE 5 MG PO TABS: 5.0000 mg | ORAL_TABLET | Freq: Every day | ORAL | Status: DC

## 2014-12-16 MED ORDER — FLUTICASONE PROPIONATE 50 MCG/ACT NA SUSP: 2.0000 | Freq: Every day | NASAL | Status: DC

## 2014-12-16 MED ORDER — BENZONATATE 200 MG PO CAPS
200.0000 mg | ORAL_CAPSULE | Freq: Two times a day (BID) | ORAL | Status: DC | PRN
Start: 2014-12-16 — End: 2014-12-28

## 2014-12-16 NOTE — Progress Notes (Signed)
   Subjective:    Patient ID: Adam Terry, male    DOB: 1975/10/05, 39 y.o.   MRN: [id number]  HPI   Cough: Patient presents with complaints of cough for 5-6 days, nonproductive in nature. He denies fever, chills, nausea, vomit, diarrhea, sore throat, eye or ear discomfort, or rash. He does endorse a headache that's primarily from the continuous cough. He does endorse postnasal drip. She has been using Robitussin-DM, drinking tea with honey without great improvement. She has not taken any antihistamines or nasal sprays. He is eating and drinking well. He has not been exposed to any sick contacts that he knows of. He is up-to-date on his flu and tdap. He has had no medicine changes. He does see an allergist. He denies any wheeze, and states she's had a mild history of asthma in the past.   Past Medical History  Diagnosis Date  . ALLERGIC RHINITIS 09/17/2007  . NEPHROLITHIASIS, HX OF 10/20/2007  . STONE, URINARY CALCULUS,UNSPEC. 09/17/2007  . ADHD (attention deficit hyperactivity disorder)   . Allergy    Allergies  Allergen Reactions  . Penicillins     REACTION: hives, throat closes  . Sulfamethoxazole     REACTION: hives, throat closes     Review of Systems Negative, with the exception of above mentioned in HPI     Objective:   Physical Exam BP 133/89 mmHg  Pulse 76  Temp(Src) 97.9 F (36.6 C) (Oral)  Resp 14  Ht 5\' 7"  (1.702 m)  Wt 209 lb (94.802 kg)  BMI 32.73 kg/m2 Gen: Afebrile. No acute distress. Nontoxic in appearance, well-developed, well-nourished, Caucasian male. Pleasant. HENT: AT. [place]. Bilateral TM visualized, no erythema or bulging. Full tympanic membranes. MMM, no oral lesions. Bilateral nares with erythema and swelling. Throat without erythema or exudates. Cobblestoning present Eyes:Pupils Equal Round Reactive to light, Extraocular movements intact,  Conjunctiva without redness, discharge or icterus. Neck/lymp/endocrine: Supple, no lymphadenopathy CV: RRR  Chest:  CTAB, no wheeze or crackles, no rhonchi. Good air movement. Abd: Soft. Round. NTND. BS present. No Masses palpated.  Skin: No rashes, purpura or petechiae.     Assessment & Plan:  1. Viral URI with cough - Prescription Tessalon Perles for cough. Flonase should be restarted, refills given to patient today. Continue Mucinex to decrease secretions. Restart Xyzal, refills prescribed today. - Rest, hydrate, Advil/Tylenol for any fevers and aches or pains. - Follow-up one week if no improvement

## 2014-12-16 NOTE — Progress Notes (Signed)
Pre visit review using our clinic review tool, if applicable. No additional management support is needed unless otherwise documented below in the visit note. 

## 2014-12-16 NOTE — Patient Instructions (Signed)
I have called in xyzal, Flonase and tessalon Perles. Start these today. Use antihistamine and Flonase for at least a month. Continue mucinex 2x a day.  If no improvement please be seen in 2 weeks.

## 2014-12-28 ENCOUNTER — Ambulatory Visit (INDEPENDENT_AMBULATORY_CARE_PROVIDER_SITE_OTHER): Payer: BLUE CROSS/BLUE SHIELD | Admitting: Family Medicine

## 2014-12-28 ENCOUNTER — Encounter: Payer: Self-pay | Admitting: Family Medicine

## 2014-12-28 VITALS — BP 131/88 | HR 80 | Temp 98.2°F | Resp 18 | Ht 67.0 in | Wt 212.0 lb

## 2014-12-28 DIAGNOSIS — J0100 Acute maxillary sinusitis, unspecified: Secondary | ICD-10-CM | POA: Diagnosis not present

## 2014-12-28 DIAGNOSIS — J320 Chronic maxillary sinusitis: Secondary | ICD-10-CM | POA: Insufficient documentation

## 2014-12-28 MED ORDER — DOXYCYCLINE HYCLATE 100 MG PO TABS: 100.0000 mg | ORAL_TABLET | Freq: Two times a day (BID) | ORAL | Status: DC

## 2014-12-28 MED ORDER — LEVALBUTEROL TARTRATE 45 MCG/ACT IN AERO: 1.0000 | INHALATION_SPRAY | Freq: Four times a day (QID) | RESPIRATORY_TRACT | Status: DC | PRN

## 2014-12-28 MED ORDER — PREDNISONE 20 MG PO TABS: 40.0000 mg | ORAL_TABLET | Freq: Every day | ORAL | Status: DC

## 2014-12-28 MED ORDER — HYDROCODONE-HOMATROPINE 5-1.5 MG/5ML PO SYRP: 5.0000 mL | ORAL_SOLUTION | Freq: Every evening | ORAL | Status: DC | PRN

## 2014-12-28 NOTE — Progress Notes (Signed)
   Subjective:    Patient ID: Adam Terry, male    DOB: 1975-11-07, 39 y.o.   MRN: [id number]  HPI  Cough: pt presents for acute OV after being seeing two weeks ago for a viral URI. Pt has restarted his flonase, inhaler, mucinex and xyzal as encouraged. He continues to have nonproductive cough, now 3 weeks in duration. He also endorses mild nausea, rhinrrhea, PND, and chest burning. He denies vomit, diarrhea, abd pain, sore throat, fever or chills. He states the [employer] [facility] were not effective for him. He does have mild asthma, mostly exacerbated by infections. He has a xopenex inhaler he is asking for refills on today, he states it works better for him then the albuterol.   Non-smoker  Past Medical History  Diagnosis Date  . ALLERGIC RHINITIS 09/17/2007  . NEPHROLITHIASIS, HX OF 10/20/2007  . STONE, URINARY CALCULUS,UNSPEC. 09/17/2007  . ADHD (attention deficit hyperactivity disorder)   . Allergy    Allergies  Allergen Reactions  . Penicillins     REACTION: hives, throat closes  . Sulfamethoxazole     REACTION: hives, throat closes    Review of Systems Negative, with the exception of above mentioned in HPI     Objective:   Physical Exam BP 131/88 mmHg  Pulse 80  Temp(Src) 98.2 F (36.8 C) (Temporal)  Resp 18  Ht 5\' 7"  (1.702 m)  Wt 212 lb (96.163 kg)  BMI 33.20 kg/m2  SpO2 96% Gen: Afebrile. No acute distress. Nontoxic in appearance. Well developed, well nourished. Caucasian male.  HENT: AT. [place]. Bilateral TM visualized and normal in appearance. MMM, 2-3 punctate red oral lesion soft palate. Bilateral nares with erythema and swelling. Throat with mild erythema, no exudates.  Eyes:Pupils Equal Round Reactive to light, Extraocular movements intact,  Conjunctiva without redness, discharge or icterus. Neck/lymp/endocrine: Supple,mild ant cervical l lymphadenopathy CV: RRR  Chest: CTAB, no wheeze or crackles. Good air movement, normal resp effort.  Abd: Soft.NTND. BS  present Skin: No rashes, purpura or petechiae.       Assessment & Plan:  1. Acute maxillary sinusitis, recurrence not specified - rest, hydrate, prednisone, doxycycline, Continue xyzal and flonase.  - refill on xopenex inhaler (h/o mild asthma, pt had sample inhaler with him today and asked for refills--> (discussed ot does not appear to be on his formulary and may be an out of pocket charge)  - Hycodan prescribed for qhs only,  - doxycycline (VIBRA-TABS) 100 MG tablet; Take 1 tablet (100 mg total) by mouth 2 (two) times daily.  Dispense: 20 tablet; Refill: 0 - predniSONE (DELTASONE) 20 MG tablet; Take 2 tablets (40 mg total) by mouth daily with breakfast.  Dispense: 10 tablet; Refill: 0 - HYDROcodone-homatropine (HYCODAN) 5-1.5 MG/5ML syrup; Take 5 mLs by mouth at bedtime as needed for cough.  Dispense: 120 mL; Refill: 0 - levalbuterol (XOPENEX HFA) 45 MCG/ACT inhaler; Inhale 1-2 puffs into the lungs every 6 (six) hours as needed for wheezing.  Dispense: 1 Inhaler; Refill: 5  F/U 1 week if no improvement

## 2014-12-28 NOTE — Patient Instructions (Signed)

## 2014-12-28 NOTE — Progress Notes (Signed)
Pre visit review using our clinic review tool, if applicable. No additional management support is needed unless otherwise documented below in the visit note. 

## 2015-02-27 ENCOUNTER — Telehealth: Payer: Self-pay | Admitting: Internal Medicine

## 2015-02-27 MED ORDER — AMPHETAMINE-DEXTROAMPHET ER 20 MG PO CP24: 20.0000 mg | ORAL_CAPSULE | ORAL | Status: DC

## 2015-02-27 MED ORDER — AMPHETAMINE-DEXTROAMPHET ER 20 MG PO CP24: 20.0000 mg | ORAL_CAPSULE | Freq: Every morning | ORAL | Status: DC

## 2015-02-27 NOTE — Telephone Encounter (Signed)
Left message on personal voicemail Rx's ready for pickup, will be at the front desk. Rx's printed and signed.

## 2015-02-27 NOTE — Telephone Encounter (Signed)
Pt request refill amphetamine-dextroamphetamine (ADDERALL XR) 20 MG 24 hr capsule °3 mo supply °

## 2015-03-22 ENCOUNTER — Encounter: Payer: Self-pay | Admitting: Family Medicine

## 2015-03-22 ENCOUNTER — Ambulatory Visit (INDEPENDENT_AMBULATORY_CARE_PROVIDER_SITE_OTHER): Payer: Managed Care, Other (non HMO) | Admitting: Family Medicine

## 2015-03-22 VITALS — BP 119/84 | HR 89 | Temp 97.5°F | Resp 20 | Wt 211.8 lb

## 2015-03-22 DIAGNOSIS — J4521 Mild intermittent asthma with (acute) exacerbation: Secondary | ICD-10-CM | POA: Diagnosis not present

## 2015-03-22 DIAGNOSIS — J209 Acute bronchitis, unspecified: Secondary | ICD-10-CM

## 2015-03-22 DIAGNOSIS — J45901 Unspecified asthma with (acute) exacerbation: Secondary | ICD-10-CM | POA: Insufficient documentation

## 2015-03-22 MED ORDER — AZELASTINE HCL 0.1 % NA SOLN: 2.0000 | Freq: Two times a day (BID) | NASAL | Status: AC

## 2015-03-22 MED ORDER — HYDROCODONE-HOMATROPINE 5-1.5 MG/5ML PO SYRP: 5.0000 mL | ORAL_SOLUTION | Freq: Three times a day (TID) | ORAL | Status: DC | PRN

## 2015-03-22 MED ORDER — BUDESONIDE-FORMOTEROL FUMARATE 160-4.5 MCG/ACT IN AERO: 2.0000 | INHALATION_SPRAY | Freq: Two times a day (BID) | RESPIRATORY_TRACT | Status: DC

## 2015-03-22 MED ORDER — PREDNISONE 50 MG PO TABS: 50.0000 mg | ORAL_TABLET | Freq: Every day | ORAL | Status: DC

## 2015-03-22 MED ORDER — DOXYCYCLINE HYCLATE 100 MG PO TABS: 100.0000 mg | ORAL_TABLET | Freq: Two times a day (BID) | ORAL | Status: DC

## 2015-03-22 NOTE — Patient Instructions (Signed)
Doxycycline symnbicort daily Albuterol is rescue only, ifneeded more than 2 times a week or waking at night and needing, need to talk about more daily coverage. azestlin daily, with flonase (can do flonase every other day) Hycodan only as needed Mucinex and robitussin  Prednisone burst

## 2015-03-22 NOTE — Progress Notes (Signed)
Patient ID: Adam Terry, male   DOB: 05-11-1975, 40 y.o.   MRN: [id number]    BURKE HAZZARD , 02/06/1975, 40 y.o., male MRN: [id number]  CC: Cough Subjective: Pt presents for an acute OV with complaints of cough of 5 days duration. Associated symptoms include PND, dry cough, fever, chills, rhinorrhea, nasal congestion. Pt feels symptoms are worse in the morning. Pt has tried old hycodan, albuterol, xopenex and mucinex to ease their symptoms.  Pt denies sore throat, nausea, vomit, diarrhea. His kids are ill as well.  Flu and tdap UTD. Pt has asthma history.  Allergies  Allergen Reactions  . Penicillins     REACTION: hives, throat closes  . Sulfamethoxazole     REACTION: hives, throat closes   Social History  Substance Use Topics  . Smoking status: Never Smoker   . Smokeless tobacco: Never Used  . Alcohol Use: No   Past Medical History  Diagnosis Date  . ALLERGIC RHINITIS 09/17/2007  . NEPHROLITHIASIS, HX OF 10/20/2007  . STONE, URINARY CALCULUS,UNSPEC. 09/17/2007  . ADHD (attention deficit hyperactivity disorder)   . Allergy    Past Surgical History  Procedure Laterality Date  . Tonsillectomy     Family History  Problem Relation Age of Onset  . Urolithiasis Mother   . Leukemia Maternal Uncle   . Heart disease Maternal Grandmother      Medication List       This list is accurate as of: 03/22/15  9:05 AM.  Always use your most recent med list.               albuterol 108 (90 Base) MCG/ACT inhaler  Commonly known as:  PROVENTIL HFA;VENTOLIN HFA  Inhale 1-2 puffs into the lungs every 6 (six) hours as needed for wheezing or shortness of breath.     amLODipine-benazepril 5-20 MG capsule  Commonly known as:  LOTREL  Take 1 capsule by mouth daily.     amphetamine-dextroamphetamine 20 MG 24 hr capsule  Commonly known as:  ADDERALL XR  Take 1 capsule (20 mg total) by mouth every morning.     aspirin-acetaminophen-caffeine 250-250-65 MG tablet  Commonly known as:   EXCEDRIN MIGRAINE  Take 1 tablet by mouth every 6 (six) hours as needed for headache.     fluticasone 50 MCG/ACT nasal spray  Commonly known as:  FLONASE  Place 2 sprays into both nostrils daily.     levalbuterol 45 MCG/ACT inhaler  Commonly known as:  XOPENEX HFA  Inhale 1-2 puffs into the lungs every 6 (six) hours as needed for wheezing.     levocetirizine 5 MG tablet  Commonly known as:  XYZAL  Take 1 tablet (5 mg total) by mouth daily.       ROS: Negative, with the exception of above mentioned in HPI  Objective:  BP 119/84 mmHg  Pulse 89  Temp(Src) 97.5 F (36.4 C)  Resp 20  Wt 211 lb 12 oz (96.049 kg)  SpO2 96% Body mass index is 33.16 kg/(m^2). Gen: Afebrile. No acute distress. Nontoxic in appearance, well developed, well nourished, male, very pleasant.  HENT: AT. [place]. Bilateral TM visualized, shiny air fluid levels. MMM, no oral lesions. Bilateral nares with erythema, mild swelling. Throat with mild erythema, no exudates. Cough on exam, hoarseness on exam. No TTP facial sinus.  Eyes:Pupils Equal Round Reactive to light, Extraocular movements intact,  Conjunctiva without redness, discharge or icterus. Neck/lymp/endocrine: Supple, shotty anterior and posterior lymphadenopathy CV: RRR  Chest: CTAB, no  wheeze or crackles. Good air movement, normal resp effort. Cough with deep breath. Abd: Soft. NTND. BS present Skin: No rashes, purpura or petechiae.  Neuro: Normal gait. PERLA. EOMi. Alert. Oriented x3   Assessment/Plan: Adam Terry is a 40 y.o. male present for acute OV for  1. Acute bronchitis, unspecified organism - doxycycline (VIBRA-TABS) 100 MG tablet; Take 1 tablet (100 mg total) by mouth 2 (two) times daily.  Dispense: 20 tablet; Refill: 0 - HYDROcodone-homatropine (HYCODAN) 5-1.5 MG/5ML syrup; Take 5 mLs by mouth every 8 (eight) hours as needed for cough.  Dispense: 120 mL; Refill: 0  2. Asthma with acute exacerbation, mild intermittent - restart Symbicort.  Refills provided. Explained all inhalers and their purpose today. Discussed rescue inhalers and when to use, as well as an asthma action plan if using more than 2 times a week (without illness) - budesonide-formoterol (SYMBICORT) 160-4.5 MCG/ACT inhaler; Inhale 2 puffs into the lungs 2 (two) times daily.  Dispense: 1 Inhaler; Refill: 3 - predniSONE (DELTASONE) 50 MG tablet; Take 1 tablet (50 mg total) by mouth daily with breakfast.  Dispense: 5 tablet; Refill: 0 - azelastine (ASTELIN) 0.1 % nasal spray; Place 2 sprays into both nostrils 2 (two) times daily. Use in each nostril as directed  Dispense: 30 mL; Refill: 12    electronically signed by:  Howard Pouch, DO  [place]

## 2015-04-19 ENCOUNTER — Telehealth: Payer: Self-pay | Admitting: Internal Medicine

## 2015-04-19 NOTE — Telephone Encounter (Signed)
PT needs PA on adderall xr 20mg  call  [employer] [phone]

## 2015-04-20 NOTE — Telephone Encounter (Signed)
PA submitted, please allow 3-5 business days for response from insurance.

## 2015-04-21 ENCOUNTER — Telehealth: Payer: Self-pay | Admitting: *Deleted

## 2015-04-21 NOTE — Telephone Encounter (Signed)
Left message on voicemail to call office. PA for Adderall ER has been approved 04/20/2015 through 04/19/2016.

## 2015-04-21 NOTE — Telephone Encounter (Signed)
Spoke to pt, told him PA for Adderall ER has been approved 04/20/2015 through 04/19/2016. Pt verbalized understanding.

## 2015-05-09 ENCOUNTER — Telehealth: Payer: Self-pay | Admitting: *Deleted

## 2015-05-09 NOTE — Telephone Encounter (Signed)
Patient called and left message requesting Rx sent for Prednisone,antibiotic and cough medication. States he has sinus infection and food poisoning. Returned patient call told patient Dr Raoul Pitch does not treat over the phone she is not patient PCP. Patient offered an appt but states he is too sick to go out. Advised patient to follow up with PCP or call back to schedule appt here for evaluation of symptoms or if worse seek medical attention at ED. Patient verbalized understanding.

## 2015-05-12 ENCOUNTER — Emergency Department (HOSPITAL_BASED_OUTPATIENT_CLINIC_OR_DEPARTMENT_OTHER)
Admission: EM | Admit: 2015-05-12 | Discharge: 2015-05-12 | Disposition: A | Discharge status: Home / Self Care | Payer: Managed Care, Other (non HMO) | Attending: Emergency Medicine | Admitting: Emergency Medicine

## 2015-05-12 ENCOUNTER — Encounter (HOSPITAL_BASED_OUTPATIENT_CLINIC_OR_DEPARTMENT_OTHER): Payer: Self-pay | Admitting: *Deleted

## 2015-05-12 DIAGNOSIS — N23 Unspecified renal colic: Secondary | ICD-10-CM | POA: Insufficient documentation

## 2015-05-12 DIAGNOSIS — R319 Hematuria, unspecified: Secondary | ICD-10-CM | POA: Diagnosis not present

## 2015-05-12 DIAGNOSIS — R112 Nausea with vomiting, unspecified: Secondary | ICD-10-CM | POA: Insufficient documentation

## 2015-05-12 DIAGNOSIS — R109 Unspecified abdominal pain: Secondary | ICD-10-CM | POA: Diagnosis present

## 2015-05-12 DIAGNOSIS — R61 Generalized hyperhidrosis: Secondary | ICD-10-CM | POA: Diagnosis not present

## 2015-05-12 DIAGNOSIS — I10 Essential (primary) hypertension: Secondary | ICD-10-CM | POA: Insufficient documentation

## 2015-05-12 HISTORY — DX: Essential (primary) hypertension: I10

## 2015-05-12 LAB — URINALYSIS, ROUTINE W REFLEX MICROSCOPIC
Bilirubin Urine: NEGATIVE
Glucose, UA: NEGATIVE mg/dL
Ketones, ur: NEGATIVE mg/dL
Leukocytes, UA: NEGATIVE
Nitrite: NEGATIVE
Protein, ur: NEGATIVE mg/dL
Specific Gravity, Urine: 1.024 (ref 1.005–1.030)
pH: 5.5 (ref 5.0–8.0)

## 2015-05-12 LAB — URINE MICROSCOPIC-ADD ON: Bacteria, UA: NONE SEEN

## 2015-05-12 MED ORDER — HYDROMORPHONE HCL 1 MG/ML IJ SOLN
1.0000 mg | Freq: Once | INTRAMUSCULAR | Status: AC
  Administered 2015-05-12: 1 mg via INTRAMUSCULAR
  Filled 2015-05-12: qty 1

## 2015-05-12 MED ORDER — KETOROLAC TROMETHAMINE 10 MG PO TABS: 10.0000 mg | ORAL_TABLET | Freq: Four times a day (QID) | ORAL | Status: DC | PRN

## 2015-05-12 MED ORDER — OXYCODONE-ACETAMINOPHEN 5-325 MG PO TABS: 1.0000 | ORAL_TABLET | ORAL | Status: DC | PRN

## 2015-05-12 MED ORDER — KETOROLAC TROMETHAMINE 60 MG/2ML IM SOLN
60.0000 mg | Freq: Once | INTRAMUSCULAR | Status: AC
  Administered 2015-05-12: 60 mg via INTRAMUSCULAR
  Filled 2015-05-12: qty 2

## 2015-05-12 MED ORDER — ONDANSETRON HCL 4 MG PO TABS: 4.0000 mg | ORAL_TABLET | Freq: Three times a day (TID) | ORAL | Status: DC | PRN

## 2015-05-12 MED ORDER — ONDANSETRON 4 MG PO TBDP
4.0000 mg | ORAL_TABLET | Freq: Once | ORAL | Status: AC
  Administered 2015-05-12: 4 mg via ORAL
  Filled 2015-05-12: qty 1

## 2015-05-12 NOTE — Discharge Instructions (Signed)
Kidney Stones °Kidney stones (urolithiasis) are deposits that form inside your kidneys. The intense pain is caused by the stone moving through the urinary tract. When the stone moves, the ureter goes into spasm around the stone. The stone is usually passed in the urine.  °CAUSES  °· A disorder that makes certain neck glands produce too much parathyroid hormone (primary hyperparathyroidism). °· A buildup of uric acid crystals, similar to gout in your joints. °· Narrowing (stricture) of the ureter. °· A kidney obstruction present at birth (congenital obstruction). °· Previous surgery on the kidney or ureters. °· Numerous kidney infections. °SYMPTOMS  °· Feeling sick to your stomach (nauseous). °· Throwing up (vomiting). °· Blood in the urine (hematuria). °· Pain that usually spreads (radiates) to the groin. °· Frequency or urgency of urination. °DIAGNOSIS  °· Taking a history and physical exam. °· Blood or urine tests. °· CT scan. °· Occasionally, an examination of the inside of the urinary bladder (cystoscopy) is performed. °TREATMENT  °· Observation. °· Increasing your fluid intake. °· Extracorporeal shock wave lithotripsy--This is a noninvasive procedure that uses shock waves to break up kidney stones. °· Surgery may be needed if you have severe pain or persistent obstruction. There are various surgical procedures. Most of the procedures are performed with the use of small instruments. Only small incisions are needed to accommodate these instruments, so recovery time is minimized. °The size, location, and chemical composition are all important variables that will determine the proper choice of action for you. Talk to your health care provider to better understand your situation so that you will minimize the risk of injury to yourself and your kidney.  °HOME CARE INSTRUCTIONS  °· Drink enough water and fluids to keep your urine clear or pale yellow. This will help you to pass the stone or stone fragments. °· Strain  all urine through the provided strainer. Keep all particulate matter and stones for your health care provider to see. The stone causing the pain may be as small as a grain of salt. It is very important to use the strainer each and every time you pass your urine. The collection of your stone will allow your health care provider to analyze it and verify that a stone has actually passed. The stone analysis will often identify what you can do to reduce the incidence of recurrences. °· Only take over-the-counter or prescription medicines for pain, discomfort, or fever as directed by your health care provider. °· Keep all follow-up visits as told by your health care provider. This is important. °· Get follow-up X-rays if required. The absence of pain does not always mean that the stone has passed. It may have only stopped moving. If the urine remains completely obstructed, it can cause loss of kidney function or even complete destruction of the kidney. It is your responsibility to make sure X-rays and follow-ups are completed. Ultrasounds of the kidney can show blockages and the status of the kidney. Ultrasounds are not associated with any radiation and can be performed easily in a matter of minutes. °· Make changes to your daily diet as told by your health care provider. You may be told to: °¨ Limit the amount of salt that you eat. °¨ Eat 5 or more servings of fruits and vegetables each day. °¨ Limit the amount of meat, poultry, fish, and eggs that you eat. °· Collect a 24-hour urine sample as told by your health care provider. You may need to collect another urine sample every 6-12   months. °SEEK MEDICAL CARE IF: °· You experience pain that is progressive and unresponsive to any pain medicine you have been prescribed. °SEEK IMMEDIATE MEDICAL CARE IF:  °· Pain cannot be controlled with the prescribed medicine. °· You have a fever or shaking chills. °· The severity or intensity of pain increases over 18 hours and is not  relieved by pain medicine. °· You develop a new onset of abdominal pain. °· You feel faint or pass out. °· You are unable to urinate. °  °This information is not intended to replace advice given to you by your health care provider. Make sure you discuss any questions you have with your health care provider. °  °Document Released: 12/31/2004 Document Revised: 09/21/2014 Document Reviewed: 06/03/2012 °[employer]vier Interactive Patient Education ©2016 [employer] ° °

## 2015-05-12 NOTE — ED Notes (Signed)
States has had nausea and vomiting since onset of pain, states took 1 percocet for pain, but vomited medication.

## 2015-05-12 NOTE — ED Notes (Signed)
Pt verbalizes understanding of d/c instructions and denies any further needs at this time. 

## 2015-05-12 NOTE — ED Provider Notes (Signed)
CSN: [id number]     Arrival date & time 05/12/15  1848 History   By signing my name below, I, [name], attest that this documentation has been prepared under the direction and in the presence of Julianne Rice, MD.  Electronically Signed: [name], ED Scribe. 05/12/2015. 8:14 PM.   Chief Complaint  Patient presents with  . Abdominal Pain   The history is provided by the patient. No language interpreter was used.    HPI Comments: Adam Terry is a 40 y.o. male with a PMHx of kidney stones, nephrolithiasis, and HTN who presents to the Emergency Department complaining of constant, ongoing, sudden onset R sided abdominal pain and R sided flank pain onset 71 earlier today. He also reports associated nausea, vomiting, and diaphoresis. Discomfort is exacerbated with pressure to the area. No alleviating factors. Prescribed Percocet attempted prior to arrival without any improvement. No recent fever, chills, or dysuria. Pt states current pain feels similar to previous kidney stones. He reports a history of 4 kidney stones with last episode 8 months ago. No prior history of stent placements or lithotripsy procedures.  PCP: Nyoka Cowden, MD    Past Medical History  Diagnosis Date  . ALLERGIC RHINITIS 09/17/2007  . NEPHROLITHIASIS, HX OF 10/20/2007  . STONE, URINARY CALCULUS,UNSPEC. 09/17/2007  . ADHD (attention deficit hyperactivity disorder)   . Allergy   . Hypertension    Past Surgical History  Procedure Laterality Date  . Tonsillectomy     Family History  Problem Relation Age of Onset  . Urolithiasis Mother   . Leukemia Maternal Uncle   . Heart disease Maternal Grandmother    Social History  Substance Use Topics  . Smoking status: Never Smoker   . Smokeless tobacco: Never Used  . Alcohol Use: No    Review of Systems  Constitutional: Positive for diaphoresis. Negative for fever and chills.  Respiratory: Negative for chest tightness and shortness of breath.    Cardiovascular: Negative for chest pain.  Gastrointestinal: Positive for nausea, vomiting and abdominal pain. Negative for diarrhea and constipation.  Genitourinary: Positive for hematuria and flank pain. Negative for dysuria, frequency, penile swelling, scrotal swelling, penile pain and testicular pain.  Musculoskeletal: Positive for back pain. Negative for myalgias, neck pain and neck stiffness.  Skin: Negative for rash and wound.  Neurological: Negative for dizziness, weakness, light-headedness, numbness and headaches.  Psychiatric/Behavioral: Negative for confusion.  All other systems reviewed and are negative.     Allergies  Penicillins and Sulfamethoxazole  Home Medications   Prior to Admission medications   Medication Sig Start Date End Date Taking? Authorizing Provider  albuterol (PROVENTIL HFA;VENTOLIN HFA) 108 (90 BASE) MCG/ACT inhaler Inhale 1-2 puffs into the lungs every 6 (six) hours as needed for wheezing or shortness of breath.    Historical Provider, MD  amLODipine-benazepril (LOTREL) 5-20 MG per capsule Take 1 capsule by mouth daily. 06/10/14   Marletta Lor, MD  amphetamine-dextroamphetamine (ADDERALL XR) 20 MG 24 hr capsule Take 1 capsule (20 mg total) by mouth every morning. 02/27/15   Marletta Lor, MD  aspirin-acetaminophen-caffeine (EXCEDRIN MIGRAINE) [phone] MG per tablet Take 1 tablet by mouth every 6 (six) hours as needed for headache.    Historical Provider, MD  azelastine (ASTELIN) 0.1 % nasal spray Place 2 sprays into both nostrils 2 (two) times daily. Use in each nostril as directed 03/22/15   Renee A Kuneff, DO  budesonide-formoterol (SYMBICORT) 160-4.5 MCG/ACT inhaler Inhale 2 puffs into the lungs 2 (two)  times daily. 03/22/15   Renee A Kuneff, DO  doxycycline (VIBRA-TABS) 100 MG tablet Take 1 tablet (100 mg total) by mouth 2 (two) times daily. 03/22/15   Renee A Kuneff, DO  fluticasone (FLONASE) 50 MCG/ACT nasal spray Place 2 sprays into both  nostrils daily. 12/16/14   Renee A Kuneff, DO  HYDROcodone-homatropine (HYCODAN) 5-1.5 MG/5ML syrup Take 5 mLs by mouth every 8 (eight) hours as needed for cough. 03/22/15   Renee A Kuneff, DO  ketorolac (TORADOL) 10 MG tablet Take 1 tablet (10 mg total) by mouth every 6 (six) hours as needed. 05/12/15   Julianne Rice, MD  levalbuterol [facility] HFA) 45 MCG/ACT inhaler Inhale 1-2 puffs into the lungs every 6 (six) hours as needed for wheezing. 12/28/14   Renee A Kuneff, DO  levocetirizine (XYZAL) 5 MG tablet Take 1 tablet (5 mg total) by mouth daily. 12/16/14   Renee A Kuneff, DO  oxyCODONE-acetaminophen (PERCOCET) 5-325 MG tablet Take 1-2 tablets by mouth every 4 (four) hours as needed for severe pain. 05/12/15   Julianne Rice, MD  predniSONE (DELTASONE) 50 MG tablet Take 1 tablet (50 mg total) by mouth daily with breakfast. 03/22/15   Renee A Kuneff, DO   Triage Vitals: BP 146/104 mmHg  Pulse 84  Temp(Src) 98 F (36.7 C) (Oral)  Resp 18  Ht 5\' 7"  (1.702 m)  Wt 200 lb (90.719 kg)  BMI 31.32 kg/m2  SpO2 97%   Physical Exam  Constitutional: He is oriented to person, place, and time. He appears well-developed and well-nourished. No distress.  HENT:  Head: Normocephalic and atraumatic.  Mouth/Throat: Oropharynx is clear and moist.  Eyes: EOM are normal. Pupils are equal, round, and reactive to light.  Neck: Normal range of motion. Neck supple. No JVD present.  Cardiovascular: Normal rate and regular rhythm.   Pulmonary/Chest: Effort normal and breath sounds normal. No respiratory distress. He has no wheezes. He has no rales. He exhibits no tenderness.  Abdominal: Soft. Bowel sounds are normal. He exhibits no distension and no mass. There is tenderness (Mild right lower quadrant tenderness to palpation. No rebound or guarding.). There is no rebound and no guarding.  Musculoskeletal: Normal range of motion. He exhibits tenderness. He exhibits no edema.  Very mild right CVA tenderness. No midline  thoracic or lumbar tenderness. No lower extremity swelling or pain.  Neurological: He is alert and oriented to person, place, and time.  Moves all extremities without deficit. Sensation is fully intact.  Skin: Skin is warm and dry. No rash noted. No erythema.  Psychiatric: He has a normal mood and affect. His behavior is normal.  Nursing note and vitals reviewed.   ED Course  Procedures (including critical care time)  DIAGNOSTIC STUDIES: Oxygen Saturation is 97% on RA, adequate by my interpretation.    COORDINATION OF CARE: 8:07 PM- Will order urinalysis. Will give Toradol, Zofran, and Dilaudid. Discussed treatment plan with pt at bedside and pt agreed to plan.     Labs Review Labs Reviewed  URINALYSIS, ROUTINE W REFLEX MICROSCOPIC (NOT AT [facility]) - Abnormal; Notable for the following:    APPearance TURBID (*)    Hgb urine dipstick LARGE (*)    All other components within normal limits  URINE MICROSCOPIC-ADD ON - Abnormal; Notable for the following:    Squamous Epithelial / LPF 0-5 (*)    All other components within normal limits    Imaging Review No results found. I have personally reviewed and evaluated these images and  lab results as part of my medical decision-making.   EKG Interpretation None      MDM   Final diagnoses:  Renal colic on right side    I personally performed the services described in this documentation, which was scribed in my presence. The recorded information has been reviewed and is accurate.   Abdominal exam is benign. There is no rebound or guarding. Low suspicion for appendicitis. States pain is similar to previous kidney stones. Urine is consistent with this. There is no evidence of urinary tract infection. Patient is feeling much better after pain medication. Think the patient can likely be discharged home to follow-up with his urologist. Return precautions given.  Julianne Rice, MD 05/12/15 2146

## 2015-05-12 NOTE — ED Notes (Signed)
kidney stone on right side since 4pm today.  Hx of same

## 2015-05-12 NOTE — ED Notes (Signed)
Pt states had onset of rt abd pain/ rt flank pain, began at approx 1630hrs today. States has hx of kidney stones, MD is with [facility]. Pt would like to defer IV at this time.

## 2015-05-16 ENCOUNTER — Encounter: Payer: Self-pay | Admitting: Physician Assistant

## 2015-05-16 ENCOUNTER — Ambulatory Visit (INDEPENDENT_AMBULATORY_CARE_PROVIDER_SITE_OTHER): Payer: Managed Care, Other (non HMO) | Admitting: Physician Assistant

## 2015-05-16 VITALS — BP 132/86 | HR 96 | Temp 98.1°F | Resp 16 | Ht 67.0 in | Wt 207.2 lb

## 2015-05-16 DIAGNOSIS — J019 Acute sinusitis, unspecified: Secondary | ICD-10-CM | POA: Diagnosis not present

## 2015-05-16 DIAGNOSIS — B9689 Other specified bacterial agents as the cause of diseases classified elsewhere: Secondary | ICD-10-CM

## 2015-05-16 MED ORDER — DOXYCYCLINE HYCLATE 100 MG PO CAPS: 100.0000 mg | ORAL_CAPSULE | Freq: Two times a day (BID) | ORAL | Status: DC

## 2015-05-16 MED ORDER — METHYLPREDNISOLONE 4 MG PO TBPK: ORAL_TABLET | ORAL | Status: DC

## 2015-05-16 MED FILL — METHYLPREDNISOLONE 4 MG TAB: 4 | 6 days supply | Qty: 21 | Fill #0

## 2015-05-16 MED FILL — DOXYCYCLINE HYC 100 MG CAP: 100 | 10 days supply | Qty: 20 | Fill #0

## 2015-05-16 NOTE — Progress Notes (Signed)
Pre visit review using our clinic review tool, if applicable. No additional management support is needed unless otherwise documented below in the visit note/SLS  

## 2015-05-16 NOTE — Patient Instructions (Addendum)
Please take antibiotic as directed.  Increase fluid intake.  Use Saline nasal spray.  Take a daily multivitamin. Continue allergy medications as directed. Please get some Delsym to use for cough. Take steroid as directed. Place a humidifier in the bedroom.  Please call or return clinic if symptoms are not improving.  Sinusitis Sinusitis is redness, soreness, and swelling (inflammation) of the paranasal sinuses. Paranasal sinuses are air pockets within the bones of your face (beneath the eyes, the middle of the forehead, or above the eyes). In healthy paranasal sinuses, mucus is able to drain out, and air is able to circulate through them by way of your nose. However, when your paranasal sinuses are inflamed, mucus and air can become trapped. This can allow bacteria and other germs to grow and cause infection. Sinusitis can develop quickly and last only a short time (acute) or continue over a long period (chronic). Sinusitis that lasts for more than 12 weeks is considered chronic.  CAUSES  Causes of sinusitis include:  Allergies.  Structural abnormalities, such as displacement of the cartilage that separates your nostrils (deviated septum), which can decrease the air flow through your nose and sinuses and affect sinus drainage.  Functional abnormalities, such as when the small hairs (cilia) that line your sinuses and help remove mucus do not work properly or are not present. SYMPTOMS  Symptoms of acute and chronic sinusitis are the same. The primary symptoms are pain and pressure around the affected sinuses. Other symptoms include:  Upper toothache.  Earache.  Headache.  Bad breath.  Decreased sense of smell and taste.  A cough, which worsens when you are lying flat.  Fatigue.  Fever.  Thick drainage from your nose, which often is green and may contain pus (purulent).  Swelling and warmth over the affected sinuses. DIAGNOSIS  Your caregiver will perform a physical exam. During the  exam, your caregiver may:  Look in your nose for signs of abnormal growths in your nostrils (nasal polyps).  Tap over the affected sinus to check for signs of infection.  View the inside of your sinuses (endoscopy) with a special imaging device with a light attached (endoscope), which is inserted into your sinuses. If your caregiver suspects that you have chronic sinusitis, one or more of the following tests may be recommended:  Allergy tests.  Nasal culture A sample of mucus is taken from your nose and sent to a lab and screened for bacteria.  Nasal cytology A sample of mucus is taken from your nose and examined by your caregiver to determine if your sinusitis is related to an allergy. TREATMENT  Most cases of acute sinusitis are related to a viral infection and will resolve on their own within 10 days. Sometimes medicines are prescribed to help relieve symptoms (pain medicine, decongestants, nasal steroid sprays, or saline sprays).  However, for sinusitis related to a bacterial infection, your caregiver will prescribe antibiotic medicines. These are medicines that will help kill the bacteria causing the infection.  Rarely, sinusitis is caused by a fungal infection. In theses cases, your caregiver will prescribe antifungal medicine. For some cases of chronic sinusitis, surgery is needed. Generally, these are cases in which sinusitis recurs more than 3 times per year, despite other treatments. HOME CARE INSTRUCTIONS   Drink plenty of water. Water helps thin the mucus so your sinuses can drain more easily.  Use a humidifier.  Inhale steam 3 to 4 times a day (for example, sit in the bathroom with the shower  running).  Apply a warm, moist washcloth to your face 3 to 4 times a day, or as directed by your caregiver.  Use saline nasal sprays to help moisten and clean your sinuses.  Take over-the-counter or prescription medicines for pain, discomfort, or fever only as directed by your  caregiver. SEEK IMMEDIATE MEDICAL CARE IF:  You have increasing pain or severe headaches.  You have nausea, vomiting, or drowsiness.  You have swelling around your face.  You have vision problems.  You have a stiff neck.  You have difficulty breathing. MAKE SURE YOU:   Understand these instructions.  Will watch your condition.  Will get help right away if you are not doing well or get worse. Document Released: 12/31/2004 Document Revised: 03/25/2011 Document Reviewed: 01/15/2011 [facility] Patient Information 2014 [place], [place].

## 2015-05-16 NOTE — Progress Notes (Signed)
Patient presents to clinic today c/o 2 weeks of sinus pressure, sinus congestion, sinus headaches, ear pressure and pain with dry cough. Symptoms have gradually worsened since onset. Endorses thick rhinorrhea that is dark green. Denies fever, chills, SOB or chest pain. Has history of asthma but denies wheezing. Is using medications as directed.   Past Medical History  Diagnosis Date  . ALLERGIC RHINITIS 09/17/2007  . NEPHROLITHIASIS, HX OF 10/20/2007  . STONE, URINARY CALCULUS,UNSPEC. 09/17/2007  . ADHD (attention deficit hyperactivity disorder)   . Allergy   . Hypertension     Current Outpatient Prescriptions on File Prior to Visit  Medication Sig Dispense Refill  . albuterol (PROVENTIL HFA;VENTOLIN HFA) 108 (90 BASE) MCG/ACT inhaler Inhale 1-2 puffs into the lungs every 6 (six) hours as needed for wheezing or shortness of breath.    Marland Kitchen amLODipine-benazepril (LOTREL) 5-20 MG per capsule Take 1 capsule by mouth daily. 90 capsule 4  . amphetamine-dextroamphetamine (ADDERALL XR) 20 MG 24 hr capsule Take 1 capsule (20 mg total) by mouth every morning. 30 capsule 0  . aspirin-acetaminophen-caffeine (EXCEDRIN MIGRAINE) [id number] MG per tablet Take 1 tablet by mouth every 6 (six) hours as needed for headache.    Marland Kitchen azelastine (ASTELIN) 0.1 % nasal spray Place 2 sprays into both nostrils 2 (two) times daily. Use in each nostril as directed 30 mL 12  . budesonide-formoterol (SYMBICORT) 160-4.5 MCG/ACT inhaler Inhale 2 puffs into the lungs 2 (two) times daily. 1 Inhaler 3  . fluticasone (FLONASE) 50 MCG/ACT nasal spray Place 2 sprays into both nostrils daily. 16 g 6  . levalbuterol (XOPENEX HFA) 45 MCG/ACT inhaler Inhale 1-2 puffs into the lungs every 6 (six) hours as needed for wheezing. 1 Inhaler 5  . levocetirizine (XYZAL) 5 MG tablet Take 1 tablet (5 mg total) by mouth daily. (Patient taking differently: Take 5 mg by mouth daily as needed. ) 60 tablet 4  . ondansetron (ZOFRAN) 4 MG tablet Take 1  tablet (4 mg total) by mouth every 8 (eight) hours as needed for nausea or vomiting. 12 tablet 0   No current facility-administered medications on file prior to visit.    Allergies  Allergen Reactions  . Penicillins     REACTION: hives, throat closes  . Sulfamethoxazole     REACTION: hives, throat closes    Family History  Problem Relation Age of Onset  . Urolithiasis Mother   . Leukemia Maternal Uncle   . Heart disease Maternal Grandmother     Social History   Social History  . Marital Status: Married    Spouse Name: N/A  . Number of Children: N/A  . Years of Education: N/A   Social History Main Topics  . Smoking status: Never Smoker   . Smokeless tobacco: Never Used  . Alcohol Use: No  . Drug Use: No  . Sexual Activity: Not Asked   Other Topics Concern  . None   Social History Narrative   Review of Systems - See HPI.  All other ROS are negative.  BP 132/86 mmHg  Pulse 96  Temp(Src) 98.1 F (36.7 C) (Oral)  Resp 16  Ht 5\' 7"  (1.702 m)  Wt 207 lb 4 oz (94.008 kg)  BMI 32.45 kg/m2  SpO2 98%  Physical Exam  Constitutional: He is oriented to person, place, and time and well-developed, well-nourished, and in no distress.  HENT:  Head: Normocephalic and atraumatic.  Right Ear: Tympanic membrane normal.  Left Ear: Tympanic membrane normal.  Nose: Left  sinus exhibits maxillary sinus tenderness and frontal sinus tenderness.  Mouth/Throat: Uvula is midline, oropharynx is clear and moist and mucous membranes are normal.  Eyes: Conjunctivae are normal.  Neck: Neck supple.  Cardiovascular: Normal rate, regular rhythm, normal heart sounds and intact distal pulses.   Pulmonary/Chest: Effort normal and breath sounds normal. No respiratory distress. He has no wheezes. He has no rales. He exhibits no tenderness.  Neurological: He is alert and oriented to person, place, and time.  Skin: Skin is warm and dry. No rash noted.  Psychiatric: Affect normal.  Vitals  reviewed.   Recent Results (from the past 2160 hour(s))  Urinalysis, Routine w reflex microscopic (not at [facility])     Status: Abnormal   Collection Time: 05/12/15  9:19 PM  Result Value Ref Range   Color, Urine YELLOW YELLOW   APPearance TURBID (A) CLEAR   Specific Gravity, Urine 1.024 1.005 - 1.030   pH 5.5 5.0 - 8.0   Glucose, UA NEGATIVE NEGATIVE mg/dL   Hgb urine dipstick LARGE (A) NEGATIVE   Bilirubin Urine NEGATIVE NEGATIVE   Ketones, ur NEGATIVE NEGATIVE mg/dL   Protein, ur NEGATIVE NEGATIVE mg/dL   Nitrite NEGATIVE NEGATIVE   Leukocytes, UA NEGATIVE NEGATIVE  Urine microscopic-add on     Status: Abnormal   Collection Time: 05/12/15  9:19 PM  Result Value Ref Range   Squamous Epithelial / LPF 0-5 (A) NONE SEEN   WBC, UA 0-5 0 - 5 WBC/hpf   RBC / HPF TOO NUMEROUS TO COUNT 0 - 5 RBC/hpf   Bacteria, UA NONE SEEN NONE SEEN   Urine-Other AMORPHOUS URATES/PHOSPHATES     Comment: MUCOUS PRESENT   Assessment/Plan: 1. Acute bacterial sinusitis Will begin ABX and medrol pack. Continue allergy medications. Delsym for cough. Supportive measures reviewed. Follow-up PRN if symptoms are not resolving.  - doxycycline (VIBRAMYCIN) 100 MG capsule; Take 1 capsule (100 mg total) by mouth 2 (two) times daily.  Dispense: 20 capsule; Refill: 0 - methylPREDNISolone (MEDROL DOSEPAK) 4 MG TBPK tablet; Take following package directions.  Dispense: 21 tablet; Refill: 0

## 2015-06-13 ENCOUNTER — Ambulatory Visit (INDEPENDENT_AMBULATORY_CARE_PROVIDER_SITE_OTHER): Payer: Managed Care, Other (non HMO) | Admitting: Family Medicine

## 2015-06-13 ENCOUNTER — Encounter: Payer: Self-pay | Admitting: Family Medicine

## 2015-06-13 DIAGNOSIS — J0100 Acute maxillary sinusitis, unspecified: Secondary | ICD-10-CM | POA: Diagnosis not present

## 2015-06-13 MED ORDER — HYDROCODONE-HOMATROPINE 5-1.5 MG/5ML PO SYRP: 5.0000 mL | ORAL_SOLUTION | Freq: Every evening | ORAL | Status: DC | PRN

## 2015-06-13 MED ORDER — AZITHROMYCIN 250 MG PO TABS: ORAL_TABLET | ORAL | Status: DC

## 2015-06-13 MED FILL — AZITHROMYCIN 250 MG TABLET: 250 | 5 days supply | Qty: 6 | Fill #0

## 2015-06-13 MED FILL — HYDROCODONE-HOMATROPINE SYR: 5-1.5 | 24 days supply | Qty: 120 | Fill #0

## 2015-06-13 NOTE — Progress Notes (Signed)
Patient ID: Adam Terry, male   DOB: 1975-10-28, 40 y.o.   MRN: [id number]   Subjective:    Patient ID: Adam Terry, male    DOB: November 21, 1975, 40 y.o.   MRN: [id number]  HPI  Sinus pressure: Patient presents for an acute office visit for sinus pressure and pain that began approximately 5-6 days ago. He was seen approximately 4 weeks ago for similar complaint with another provider and given doxycycline treatment. Patient states he had complete resolution of his symptoms after the doxycycline. He has a daughter in daycare and he states ever since she started going to daycare he has been more prone to have sinus infections and bronchitis. Today he complains of sinus pressure, cough, increased phlegm production, nasal congestion, postnasal drip headache and fatigue. She denies fevers, chills, nausea, vomiting or diarrhea. Patient has asthma and allergies, he has continued his allergy regimen, and has not needed to use his rescue inhaler. He reports his daughter is ill as well, and going to her pediatrician today. He has tried the addition of cough suppressant and Mucinex, which has helped with similar symptoms, however he feels he is getting worse instead of better. He also had a steroid taper 4 weeks ago. tdap and flu UTD. Non-smoker  Past Medical History  Diagnosis Date  . ALLERGIC RHINITIS 09/17/2007  . NEPHROLITHIASIS, HX OF 10/20/2007  . STONE, URINARY CALCULUS,UNSPEC. 09/17/2007  . ADHD (attention deficit hyperactivity disorder)   . Allergy   . Hypertension    Allergies  Allergen Reactions  . Penicillins     REACTION: hives, throat closes  . Sulfamethoxazole     REACTION: hives, throat closes    Review of Systems Negative, with the exception of above mentioned in HPI     Objective:   Physical Exam BP 126/89 mmHg  Pulse 79  Temp(Src) 98.1 F (36.7 C) (Oral)  Resp 18  Wt 206 lb 8 oz (93.668 kg)  SpO2 96% Gen: Afebrile. No acute distress. Nontoxic in appearance. Well developed,  well nourished. Caucasian male.  HENT: AT. [place]. Bilateral TM visualized and normal in appearance. MMM, No oral lesions. Biilateral nares with erythema and no swelling. Throat with mild erythema, no exudates. Mild cough on exam, mild hoarseness on exam. Tender to palpation bilateral maxillary sinuses. Eyes:Pupils Equal Round Reactive to light, Extraocular movements intact,  Conjunctiva without redness, discharge or icterus. Neck/lymp/endocrine: Supple,mild ant cervical left lymphadenopathy CV: RRR  Chest: CTAB, no wheeze or crackles. Good air movement, normal resp effort.  Abd: Soft.NTND. BS present Skin: No rashes, purpura or petechiae.       Assessment & Plan:  1. Acute maxillary sinusitis, recurrence not specified - rest, hydrate, Continue xyzal and flonase.  - z-pack ; if recurrent infections may need to consider Levaquin use. Daughter symptoms sound a little bit like pertussis by description, will try Z-Pak. - Do not repeat steroid to close to last steroid use of 4 weeks ago. - HYDROcodone-homatropine (HYCODAN) 5-1.5 MG/5ML syrup; Take 5 mLs by mouth at bedtime as needed for cough.  Dispense: 120 mL; Refill: 0 - F/U 1 week if no improvement   > 25 minutes spent with patient, >50% of time spent face to face counseling patient and coordinating care.   Electronically Signed by: Howard Pouch, DO [place] [facility] [place]

## 2015-06-13 NOTE — Patient Instructions (Signed)

## 2015-06-26 ENCOUNTER — Telehealth: Payer: Self-pay

## 2015-06-26 NOTE — Telephone Encounter (Signed)
RX GIVEN TO PT.    [facility] Night - Client [place] Patient Name: Adam Terry Gender: Male DOB: 09-14-1975 Age: 40 Y 10 M 12 D Return Phone Number: [phone] (Primary), [phone] (Secondary) Address: City/State/Zip: [place] Client [facility] Night - Client Client [facility] - Night Physician Simonne Martinet - MD Contact Type Call Who Is Calling Patient / Member / Family / Caregiver Call Type Triage / Clinical Caller Name Shlome Garrido Relationship To Patient Spouse Return Phone Number [phone] (Primary) Chief Complaint Eye Redness Reason for Call Symptomatic / Request for [place] states chart 2 of 2, has pink eye , wants meds for husband also PreDisposition Did not know what to do Translation No Nurse Assessment Nurse: Angeline Slim, RN, [place] Date/Time (Eastern Time): 06/24/2015 8:22:52 PM Confirm and document reason for call. If symptomatic, describe symptoms. You must click the next button to save text entered. ---caller states her husband has pink eye and her aswell Has the patient traveled out of the country within the last 30 days? ---No Does the patient have any new or worsening symptoms? ---Yes Will a triage be completed? ---Yes Related visit to physician within the last 2 weeks? ---N/A Does the PT have any chronic conditions? (i.e. diabetes, asthma, etc.) ---Yes List chronic conditions. ---HTN Is this a behavioral health or substance abuse call? ---No Nurse: Angeline Slim, RN, A[place] Date/Time (Eastern Time): 06/24/2015 8:46:04 PM Confirm and document reason for call. If symptomatic, describe symptoms. You must click the next button to save text entered. ---caller states her husband has pink and her Has the patient traveled out of the country within the last 30 days? ---No Does the patient have any new or worsening  symptoms? ---Yes Will a triage be completed? ---Yes Related visit to physician within the last 2 weeks? ---No PLEASE NOTE: All timestamps contained within this report are represented as [place] Standard Time. CONFIDENTIALTY NOTICE: This fax transmission is intended only for the addressee. It contains information that is legally privileged, confidential or otherwise protected from use or disclosure. If you are not the intended recipient, you are strictly prohibited from reviewing, disclosing, copying using or disseminating any of this information or taking any action in reliance on or regarding this information. If you have received this fax in error, please notify us immediately by telephone so that we can arrange for its return to [place]. Phone: [phone], Toll-Free: [phone], Fax: [phone] Page: 2 of 3 Call Id: [id number] Nurse Assessment Does the PT have any chronic conditions? (i.e. diabetes, asthma, etc.) ---No Is this a behavioral health or substance abuse call? ---No Guidelines Guideline Title Affirmed Question Affirmed Notes Nurse Date/Time Eilene Ghazi Time) Eye - Pus or Discharge [1] Eye with yellow/green discharge or eyelashes stick together AND [2] PCP standing order to call in antibiotic eye drops (all triage questions negative) Zellers, RN, Afton 06/24/2015 8:46:52 PM Disp. Time Eilene Ghazi Time) Disposition Final User 06/24/2015 8:47:52 PM Home Care Yes Zellers, RN, Afton Caller Understands: Yes Disagree/Comply: Comply Care Advice Given Per Guideline HOME CARE: You should be able to treat this at home. CALL BACK IF: * Pus lasts over 3 days (72 hours) on treatment * Blurred vision develops * More than just mild discomfort * You become worse Standing Orders Preparation Additional Instructions Route Frequency Duration Nurse Comments User Name Polytrim Eye Drops 2 drops both eyes Eye Four Times Daily 5 Days Zellers, RN, A[place]on PLEASE NOTE: All  timestamps contained  within this report are represented as [place] Standard Time. CONFIDENTIALTY NOTICE: This fax transmission is intended only for the addressee. It contains information that is legally privileged, confidential or otherwise protected from use or disclosure. If you are not the intended recipient, you are strictly prohibited from reviewing, disclosing, copying using or disseminating any of this information or taking any action in reliance on or regarding this information. If you have received this fax in error, please notify us immediately by telephone so that we can arrange for its return to [place]. Phone: [phone], Toll-Free: [phone], Fax: [phone] Page: 3 of 3 Call Id: [phone] [facility] [address], [place] [address] [phone] Fax: [phone] [facility] - Client [facility] - Night Date: 06/24/2015 From: QI Department To: Simonne Martinet - MD This is an approved standing order given by our call center nurse on your behalf. Fax to [phone] within 5 business days. Thank you. Date Eilene Ghazi Time): 06/24/2015 8:03:43 PM Triage RN: Margarito Courser, RN NAME: Virgel Gess PHONE NUMBER: [phone] (Primary), [phone] (Secondary) BIRTHDATE: 11/13/1975 ADDRESS: CITY/STATE/ZIP: [place] CALLER: Spouse NAME: Adam Terry Rx Given Preparation Additional Instructions Route Frequency Duration Nurse Comments User Name Polytrim Eye Drops 2 drops both eyes Eye Four Times Daily 5 Days Zellers, RN, Afton No signature is required on standing orders.

## 2015-07-11 ENCOUNTER — Encounter: Payer: Self-pay | Admitting: Family Medicine

## 2015-07-11 ENCOUNTER — Ambulatory Visit (INDEPENDENT_AMBULATORY_CARE_PROVIDER_SITE_OTHER): Payer: Managed Care, Other (non HMO) | Admitting: Family Medicine

## 2015-07-11 VITALS — BP 139/89 | HR 100 | Temp 98.4°F | Resp 20 | Wt 206.0 lb

## 2015-07-11 DIAGNOSIS — J0100 Acute maxillary sinusitis, unspecified: Secondary | ICD-10-CM | POA: Diagnosis not present

## 2015-07-11 DIAGNOSIS — J209 Acute bronchitis, unspecified: Secondary | ICD-10-CM | POA: Diagnosis not present

## 2015-07-11 MED ORDER — BUDESONIDE-FORMOTEROL FUMARATE 160-4.5 MCG/ACT IN AERO: 2.0000 | INHALATION_SPRAY | Freq: Two times a day (BID) | RESPIRATORY_TRACT | Status: DC

## 2015-07-11 MED ORDER — LEVOFLOXACIN 750 MG PO TABS: 750.0000 mg | ORAL_TABLET | Freq: Every day | ORAL | Status: DC

## 2015-07-11 MED FILL — levoFLOXacin 750 MG TABS: 750 | 5 days supply | Qty: 5 | Fill #0

## 2015-07-11 MED FILL — SYMBICORT 160-4.5 MCG INH: 160-4.5 | 30 days supply | Qty: 10 | Fill #0

## 2015-07-11 NOTE — Progress Notes (Signed)
Patient ID: Adam Terry, male   DOB: [birth year], 40 y.o.   MRN: [id number]   Subjective:    Patient ID: Adam Terry, male    DOB: 04/16/1975, 40 y.o.   MRN: [id number]  HPI  Sinus pressure: Patient presents for an acute office visit for sinus pressure and pain that began approximately 3 days ago. He was seen approximately 4 weeks ago for similar complaint, and 8 weeks before that as well. He has been treated with z-pack and doxycyline, with good response to both, and then return of symptoms within 4-6 weeks. He reports before his daughter was in daycare he had none of these issues. Today he complains of sinus pressure, cough, right nasal congestion,  Nausea, postnasal drip headache, fatigue and ear pain. He experienced a stabbing pain in his right ear. He denies fevers, chills, vomiting or diarrhea. Patient has asthma and allergies, he has continued his allergy regimen, and has used his rescue inhaler once today.  He has tried the addition of cough suppressant and Mucinex with some benefit, however facial pain and ear pressure are worse.   tdap and flu UTD. Non-smoker  Past Medical History  Diagnosis Date  . ALLERGIC RHINITIS 09/17/2007  . NEPHROLITHIASIS, HX OF 10/20/2007  . STONE, URINARY CALCULUS,UNSPEC. 09/17/2007  . ADHD (attention deficit hyperactivity disorder)   . Allergy   . Hypertension    Allergies  Allergen Reactions  . Penicillins     REACTION: hives, throat closes  . Sulfamethoxazole     REACTION: hives, throat closes    Review of Systems Negative, with the exception of above mentioned in HPI     Objective:   Physical Exam BP 139/89 mmHg  Pulse 100  Temp(Src) 98.4 F (36.9 C)  Resp 20  Wt 206 lb (93.441 kg)  SpO2 97% Gen: Afebrile. No acute distress. Nontoxic in appearance. Well developed, well nourished. Caucasian male.  HENT: AT. [place]. Bilateral TM visualized, air fluid levels present.  MMM, No oral lesions. Biilateral nares with erythema, drainage present. no  swelling. Throat with mild erythema, no exudates. Mild cough on exam, mild hoarseness on exam. Tender to palpation right maxillary sinuses. Eyes:Pupils Equal Round Reactive to light, Extraocular movements intact,  Conjunctiva without redness, discharge or icterus. Neck/lymp/endocrine: Supple,mild ant cervical right lymphadenopathy CV: RRR  Chest: CTAB, no wheeze or crackles. Good air movement, normal resp effort.  Abd: Soft.NTND. BS present Skin: No rashes, purpura or petechiae.       Assessment & Plan:  Acute maxillary sinusitis, recurrence not specified - rest, hydrate, Continue xyzal and flonase.  - Levaquin 750 mg QD for 5 days.  - Consider ENT referral if continues to have infections.  - F/U 1 week if no improvement   > 25 minutes spent with patient, >50% of time spent face to face counseling patient and coordinating care.   Electronically Signed by: Howard Pouch, DO [facility]

## 2015-07-11 NOTE — Patient Instructions (Signed)
Levaquin once a day for 5 days.      This is where your sinus information would go.Marland Kitchen. :)

## 2015-08-08 ENCOUNTER — Ambulatory Visit (INDEPENDENT_AMBULATORY_CARE_PROVIDER_SITE_OTHER): Payer: Managed Care, Other (non HMO) | Admitting: Internal Medicine

## 2015-08-08 ENCOUNTER — Encounter: Payer: Self-pay | Admitting: Internal Medicine

## 2015-08-08 VITALS — BP 118/80 | HR 83 | Temp 98.5°F | Ht 67.0 in | Wt 208.4 lb

## 2015-08-08 DIAGNOSIS — J3089 Other allergic rhinitis: Secondary | ICD-10-CM | POA: Diagnosis not present

## 2015-08-08 DIAGNOSIS — J0100 Acute maxillary sinusitis, unspecified: Secondary | ICD-10-CM | POA: Diagnosis not present

## 2015-08-08 MED ORDER — LEVALBUTEROL TARTRATE 45 MCG/ACT IN AERO: 1.0000 | INHALATION_SPRAY | Freq: Four times a day (QID) | RESPIRATORY_TRACT | 5 refills | Status: DC | PRN

## 2015-08-08 MED FILL — LEVALBUTEROL TAR HFA 45MCG: 45 | 30 days supply | Qty: 15 | Fill #0

## 2015-08-08 NOTE — Progress Notes (Signed)
Pre visit review using our clinic review tool, if applicable. No additional management support is needed unless otherwise documented below in the visit note. 

## 2015-08-08 NOTE — Patient Instructions (Signed)
HOME CARE INSTRUCTIONS  Drink plenty of water. Water helps thin the mucus so your sinuses can drain more easily.  Use a humidifier.  Inhale steam 3-4 times a day (for example, sit in the bathroom with the shower running).  Apply a warm, moist washcloth to your face 3-4 times a day, or as directed by your health care provider.  Use saline nasal sprays to help moisten and clean your sinuses.   Acute sinusitis symptoms for less than 10 days are generally not helped by antibiotic therapy.  Use saline irrigation, warm  moist compresses and over-the-counter decongestants only as directed.  Call if there is no improvement in 5 to 7 days, or sooner if you develop increasing pain, fever, or any new symptoms.

## 2015-08-08 NOTE — Progress Notes (Signed)
Subjective:    Patient ID: Adam Terry, male    DOB: 1975/07/27, 40 y.o.   MRN: [id number]  HPI  40 year old patient who has a history of allergic rhinitis and asthma.  Over the past 42 or 42 months.  He has had multiple contacts with providers for upper and lower respiratory tract infection and has been treated with a number of antibiotics. Today he presents with a six-day history of sinus congestion and mainly rhinorrhea.  He has had no fever or sinus pain.  Symptoms cause a nocturnal cough and in a was sleeping.  Coughing sometimes triggers some mild chest tightness that requires albuterol use.  No frank wheezing He has a 42-month-old daughter who is in daycare.  His symptoms are always precipitated by illness with his daughter  Past Medical History:  Diagnosis Date  . ADHD (attention deficit hyperactivity disorder)   . ALLERGIC RHINITIS 09/17/2007  . Allergy   . Hypertension   . NEPHROLITHIASIS, HX OF 10/20/2007  . STONE, URINARY CALCULUS,UNSPEC. 09/17/2007     Social History   Social History  . Marital status: Married    Spouse name: N/A  . Number of children: N/A  . Years of education: N/A   Occupational History  . Not on file.   Social History Main Topics  . Smoking status: Never Smoker  . Smokeless tobacco: Never Used  . Alcohol use No  . Drug use: No  . Sexual activity: Not on file   Other Topics Concern  . Not on file   Social History Narrative  . No narrative on file    Past Surgical History:  Procedure Laterality Date  . TONSILLECTOMY      Family History  Problem Relation Age of Onset  . Urolithiasis Mother   . Leukemia Maternal Uncle   . Heart disease Maternal Grandmother     Allergies  Allergen Reactions  . Penicillins     REACTION: hives, throat closes  . Sulfamethoxazole     REACTION: hives, throat closes    Current Outpatient Prescriptions on File Prior to Visit  Medication Sig Dispense Refill  . albuterol (PROVENTIL HFA;VENTOLIN HFA)  108 (90 BASE) MCG/ACT inhaler Inhale 1-2 puffs into the lungs every 6 (six) hours as needed for wheezing or shortness of breath.    Marland Kitchen amLODipine-benazepril (LOTREL) 5-20 MG per capsule Take 1 capsule by mouth daily. 90 capsule 4  . amphetamine-dextroamphetamine (ADDERALL XR) 20 MG 24 hr capsule Take 1 capsule (20 mg total) by mouth every morning. 30 capsule 0  . aspirin-acetaminophen-caffeine (EXCEDRIN MIGRAINE) [id number] MG per tablet Take 1 tablet by mouth every 6 (six) hours as needed for headache.    Marland Kitchen azelastine (ASTELIN) 0.1 % nasal spray Place 2 sprays into both nostrils 2 (two) times daily. Use in each nostril as directed 30 mL 12  . budesonide-formoterol (SYMBICORT) 160-4.5 MCG/ACT inhaler Inhale 2 puffs into the lungs 2 (two) times daily. 1 Inhaler 3  . fluticasone (FLONASE) 50 MCG/ACT nasal spray Place 2 sprays into both nostrils daily. 16 g 6  . HYDROcodone-homatropine (HYCODAN) 5-1.5 MG/5ML syrup Take 5 mLs by mouth at bedtime as needed for cough. (Patient not taking: Reported on 07/11/2015) 120 mL 0  . levalbuterol (XOPENEX HFA) 45 MCG/ACT inhaler Inhale 1-2 puffs into the lungs every 6 (six) hours as needed for wheezing. 1 Inhaler 5  . levocetirizine (XYZAL) 5 MG tablet Take 1 tablet (5 mg total) by mouth daily. (Patient taking differently: Take 5 mg by  mouth daily as needed. ) 60 tablet 4  . levofloxacin (LEVAQUIN) 750 MG tablet Take 1 tablet (750 mg total) by mouth daily. 5 tablet 0   No current facility-administered medications on file prior to visit.     BP 118/80   Pulse 83   Temp 98.5 F (36.9 C) (Oral)   Ht 5\' 7"  (1.702 m)   Wt 208 lb 6 oz (94.5 kg)   SpO2 98%   BMI 32.64 kg/m     Review of Systems  Constitutional: Negative for appetite change, chills, fatigue and fever.  HENT: Positive for congestion, postnasal drip, rhinorrhea and sinus pressure. Negative for dental problem, ear pain, hearing loss, sore throat, tinnitus, trouble swallowing and voice change.     Eyes: Negative for pain, discharge and visual disturbance.  Respiratory: Positive for cough and chest tightness. Negative for wheezing and stridor.   Cardiovascular: Negative for chest pain, palpitations and leg swelling.  Gastrointestinal: Negative for abdominal distention, abdominal pain, blood in stool, constipation, diarrhea, nausea and vomiting.  Genitourinary: Negative for difficulty urinating, discharge, flank pain, genital sores, hematuria and urgency.  Musculoskeletal: Negative for arthralgias, back pain, gait problem, joint swelling, myalgias and neck stiffness.  Skin: Negative for rash.  Neurological: Negative for dizziness, syncope, speech difficulty, weakness, numbness and headaches.  Hematological: Negative for adenopathy. Does not bruise/bleed easily.  Psychiatric/Behavioral: Negative for behavioral problems and dysphoric mood. The patient is not nervous/anxious.        Objective:   Physical Exam  Constitutional: He is oriented to person, place, and time. He appears well-developed.  HENT:  Head: Normocephalic.  Right Ear: External ear normal.  Left Ear: External ear normal.  No sinus tenderness Some scarring of the tympanic membranes but no acute changes  Eyes: Conjunctivae and EOM are normal.  Neck: Normal range of motion.  Cardiovascular: Normal rate and normal heart sounds.   Pulmonary/Chest: Breath sounds normal. No respiratory distress. He has no wheezes. He has no rales.  Abdominal: Bowel sounds are normal.  Musculoskeletal: Normal range of motion. He exhibits no edema or tenderness.  Neurological: He is alert and oriented to person, place, and time.  Psychiatric: He has a normal mood and affect. His behavior is normal.          Assessment & Plan:   Recurrent viral URIs in a patient with allergic rhinitis. Natural history discussed at length We'll continue aggressive symptomatic therapy We'll hold antibiotics at this time  [facility] consider referral to  allergy  Nyoka Cowden, MD

## 2015-08-22 ENCOUNTER — Encounter: Payer: Self-pay | Admitting: Internal Medicine

## 2015-08-22 ENCOUNTER — Ambulatory Visit (INDEPENDENT_AMBULATORY_CARE_PROVIDER_SITE_OTHER): Payer: Managed Care, Other (non HMO) | Admitting: Internal Medicine

## 2015-08-22 VITALS — Temp 98.0°F | Ht 67.0 in | Wt 207.0 lb

## 2015-08-22 DIAGNOSIS — J0100 Acute maxillary sinusitis, unspecified: Secondary | ICD-10-CM

## 2015-08-22 DIAGNOSIS — J3089 Other allergic rhinitis: Secondary | ICD-10-CM | POA: Diagnosis not present

## 2015-08-22 DIAGNOSIS — B9789 Other viral agents as the cause of diseases classified elsewhere: Principal | ICD-10-CM

## 2015-08-22 DIAGNOSIS — J069 Acute upper respiratory infection, unspecified: Secondary | ICD-10-CM | POA: Diagnosis not present

## 2015-08-22 MED ORDER — AMLODIPINE BESYLATE-VALSARTAN 5-320 MG PO TABS: 1.0000 | ORAL_TABLET | Freq: Every day | ORAL | 2 refills | Status: DC

## 2015-08-22 MED ORDER — HYDROCODONE-HOMATROPINE 5-1.5 MG/5ML PO SYRP: 5.0000 mL | ORAL_SOLUTION | Freq: Every evening | ORAL | 0 refills | Status: DC | PRN

## 2015-08-22 MED ORDER — PREDNISONE 20 MG PO TABS: 20.0000 mg | ORAL_TABLET | Freq: Two times a day (BID) | ORAL | 0 refills | Status: DC

## 2015-08-22 MED ORDER — MONTELUKAST SODIUM 10 MG PO TABS: 10.0000 mg | ORAL_TABLET | Freq: Every day | ORAL | 3 refills | Status: DC

## 2015-08-22 MED ORDER — AMPHETAMINE-DEXTROAMPHET ER 20 MG PO CP24: 20.0000 mg | ORAL_CAPSULE | Freq: Every morning | ORAL | 0 refills | Status: DC

## 2015-08-22 MED FILL — predniSONE 20 MG TABS: 20 | 6 days supply | Qty: 12 | Fill #0

## 2015-08-22 MED FILL — HYDROCODONE-HOMATROPINE SYR: 5-1.5 | 24 days supply | Qty: 120 | Fill #0

## 2015-08-22 MED FILL — AMLODIPINE-VALSARTAN 5-320: 5-320 | 90 days supply | Qty: 90 | Fill #0

## 2015-08-22 MED FILL — MONTELUKAST SOD 10 MG TAB: 10 | 30 days supply | Qty: 30 | Fill #0

## 2015-08-22 NOTE — Progress Notes (Signed)
Pre visit review using our clinic review tool, if applicable. No additional management support is needed unless otherwise documented below in the visit note. 

## 2015-08-22 NOTE — Progress Notes (Signed)
Subjective:    Patient ID: Adam Terry, male    DOB: [birth year], 40 y.o.   MRN: [id number]  HPI  40 year old patient who has allergic rhinitis and asthma and is seen today in follow-up.  He was seen 2 weeks ago for a suspected viral URI with cough. He continues to be symptomatic with sinus drainage and congestion.  At times he describes his teeth aching.  He has nonproductive cough.  He has required rare albuterol rescue use.  There is been no fever He continues to receive immunotherapy and is scheduled for follow-up with allergy medicine.  Next month.  Past Medical History:  Diagnosis Date  . ADHD (attention deficit hyperactivity disorder)   . ALLERGIC RHINITIS 09/17/2007  . Allergy   . Hypertension   . NEPHROLITHIASIS, HX OF 10/20/2007  . STONE, URINARY CALCULUS,UNSPEC. 09/17/2007     Social History   Social History  . Marital status: Married    Spouse name: N/A  . Number of children: N/A  . Years of education: N/A   Occupational History  . Not on file.   Social History Main Topics  . Smoking status: Never Smoker  . Smokeless tobacco: Never Used  . Alcohol use No  . Drug use: No  . Sexual activity: Not on file   Other Topics Concern  . Not on file   Social History Narrative  . No narrative on file    Past Surgical History:  Procedure Laterality Date  . TONSILLECTOMY      Family History  Problem Relation Age of Onset  . Urolithiasis Mother   . Leukemia Maternal Uncle   . Heart disease Maternal Grandmother     Allergies  Allergen Reactions  . Penicillins     REACTION: hives, throat closes  . Sulfamethoxazole     REACTION: hives, throat closes    Current Outpatient Prescriptions on File Prior to Visit  Medication Sig Dispense Refill  . amLODipine-benazepril (LOTREL) 5-20 MG per capsule Take 1 capsule by mouth daily. 90 capsule 4  . amphetamine-dextroamphetamine (ADDERALL XR) 20 MG 24 hr capsule Take 1 capsule (20 mg total) by mouth every morning. 30  capsule 0  . aspirin-acetaminophen-caffeine (EXCEDRIN MIGRAINE) [id number] MG per tablet Take 1 tablet by mouth every 6 (six) hours as needed for headache.    Marland Kitchen azelastine (ASTELIN) 0.1 % nasal spray Place 2 sprays into both nostrils 2 (two) times daily. Use in each nostril as directed 30 mL 12  . budesonide-formoterol (SYMBICORT) 160-4.5 MCG/ACT inhaler Inhale 2 puffs into the lungs 2 (two) times daily. 1 Inhaler 3  . HYDROcodone-homatropine (HYCODAN) 5-1.5 MG/5ML syrup Take 5 mLs by mouth at bedtime as needed for cough. 120 mL 0  . levalbuterol (XOPENEX HFA) 45 MCG/ACT inhaler Inhale 1-2 puffs into the lungs every 6 (six) hours as needed for wheezing. 1 Inhaler 5  . levocetirizine (XYZAL) 5 MG tablet Take 1 tablet (5 mg total) by mouth daily. (Patient taking differently: Take 5 mg by mouth daily as needed. ) 60 tablet 4  . levofloxacin (LEVAQUIN) 750 MG tablet Take 1 tablet (750 mg total) by mouth daily. 5 tablet 0   No current facility-administered medications on file prior to visit.     BP (!) (P) 130/100 (BP Location: Right Arm, Patient Position: Sitting, Cuff Size: Normal)   Pulse (P) 84   Temp 98 F (36.7 C) (Oral)   Ht 5\' 7"  (1.702 m)   Wt 207 lb (93.9 kg)   SpO2  98%   BMI 32.42 kg/m       Review of Systems  Constitutional: Negative for appetite change, chills, fatigue and fever.  HENT: Positive for congestion, postnasal drip, rhinorrhea and sinus pressure. Negative for dental problem, ear pain, hearing loss, sore throat, tinnitus, trouble swallowing and voice change.   Eyes: Negative for pain, discharge and visual disturbance.  Respiratory: Positive for cough. Negative for chest tightness, wheezing and stridor.   Cardiovascular: Negative for chest pain, palpitations and leg swelling.  Gastrointestinal: Negative for abdominal distention, abdominal pain, blood in stool, constipation, diarrhea, nausea and vomiting.  Genitourinary: Negative for difficulty urinating, discharge,  flank pain, genital sores, hematuria and urgency.  Musculoskeletal: Negative for arthralgias, back pain, gait problem, joint swelling, myalgias and neck stiffness.  Skin: Negative for rash.  Neurological: Negative for dizziness, syncope, speech difficulty, weakness, numbness and headaches.  Hematological: Negative for adenopathy. Does not bruise/bleed easily.  Psychiatric/Behavioral: Negative for behavioral problems and dysphoric mood. The patient is not nervous/anxious.        Objective:   Physical Exam  Constitutional: He is oriented to person, place, and time. He appears well-developed and well-nourished. No distress.  Repeat blood pressure 120/90 Temperature 98 degrees No distress  HENT:  Head: Normocephalic.  Right Ear: External ear normal.  Left Ear: External ear normal.  No sinus tenderness Chronic scarring right TM, but no acute changes  Eyes: Conjunctivae and EOM are normal.  Neck: Normal range of motion.  Cardiovascular: Normal rate and normal heart sounds.   Pulmonary/Chest: Breath sounds normal. He has no wheezes.  Abdominal: Bowel sounds are normal.  Musculoskeletal: Normal range of motion. He exhibits no edema or tenderness.  Neurological: He is alert and oriented to person, place, and time.  Psychiatric: He has a normal mood and affect. His behavior is normal.          Assessment & Plan:   Allergic rhinitis, probably precipitated by viral URI. We'll treat with 7 days of prednisone Trial Singulair Follow-up allergy medicine  Nyoka Cowden, MD

## 2015-08-22 NOTE — Patient Instructions (Addendum)
Follow-up allergy medicine as scheduled  Singulair 1 tablet daily  Prednisone twice daily for 6 days  HOME CARE INSTRUCTIONS  Drink plenty of water. Water helps thin the mucus so your sinuses can drain more easily.  Use a humidifier.  Inhale steam 3-4 times a day (for example, sit in the bathroom with the shower running).  Apply a warm, moist washcloth to your face 3-4 times a day, or as directed by your health care provider.  Use saline nasal sprays to help moisten and clean your sinuses.  Okay to use Mucinex D (expectorant/decongestant combination) twice daily for sinus congestion

## 2015-10-27 MED FILL — DEXTROAMP-AMPHET ER 20 MG C: 20 | 30 days supply | Qty: 30 | Fill #0

## 2015-10-27 MED FILL — MONTELUKAST SOD 10 MG TAB: 10 | 30 days supply | Qty: 30 | Fill #1

## 2015-10-27 MED FILL — SYMBICORT 160-4.5 MCG INH: 160-4.5 | 30 days supply | Qty: 10 | Fill #1

## 2015-11-21 DIAGNOSIS — J302 Other seasonal allergic rhinitis: Secondary | ICD-10-CM | POA: Diagnosis not present

## 2015-11-21 DIAGNOSIS — J4520 Mild intermittent asthma, uncomplicated: Secondary | ICD-10-CM | POA: Diagnosis not present

## 2015-11-21 DIAGNOSIS — J301 Allergic rhinitis due to pollen: Secondary | ICD-10-CM | POA: Diagnosis not present

## 2015-11-21 DIAGNOSIS — J3081 Allergic rhinitis due to animal (cat) (dog) hair and dander: Secondary | ICD-10-CM | POA: Diagnosis not present

## 2015-12-28 ENCOUNTER — Telehealth: Payer: Self-pay | Admitting: Internal Medicine

## 2015-12-28 NOTE — Telephone Encounter (Signed)
Pt request refill  °amphetamine-dextroamphetamine (ADDERALL XR) 20 MG 24 hr capsule ° ° °

## 2015-12-29 ENCOUNTER — Other Ambulatory Visit: Payer: Self-pay | Admitting: Internal Medicine

## 2015-12-29 MED ORDER — AMPHETAMINE-DEXTROAMPHET ER 20 MG PO CP24: 20.0000 mg | ORAL_CAPSULE | Freq: Every morning | ORAL | 0 refills | Status: DC

## 2015-12-29 NOTE — Telephone Encounter (Signed)
Okay to refill? 

## 2015-12-29 NOTE — Telephone Encounter (Signed)
Pt notified Rx ready for pickup. Rx printed and signed.  

## 2016-01-01 MED FILL — MONTELUKAST SOD 10 MG TAB: 10 | 30 days supply | Qty: 30 | Fill #2

## 2016-01-01 MED FILL — DEXTROAMP-AMPHET ER 20 MG C: 20 | 30 days supply | Qty: 30 | Fill #0

## 2016-01-19 DIAGNOSIS — J301 Allergic rhinitis due to pollen: Secondary | ICD-10-CM | POA: Diagnosis not present

## 2016-01-19 DIAGNOSIS — J3081 Allergic rhinitis due to animal (cat) (dog) hair and dander: Secondary | ICD-10-CM | POA: Diagnosis not present

## 2016-01-19 DIAGNOSIS — J3089 Other allergic rhinitis: Secondary | ICD-10-CM | POA: Diagnosis not present

## 2016-01-31 ENCOUNTER — Ambulatory Visit: Payer: Managed Care, Other (non HMO) | Admitting: Internal Medicine

## 2016-02-02 ENCOUNTER — Ambulatory Visit (INDEPENDENT_AMBULATORY_CARE_PROVIDER_SITE_OTHER): Payer: BLUE CROSS/BLUE SHIELD | Admitting: Internal Medicine

## 2016-02-02 ENCOUNTER — Encounter: Payer: Self-pay | Admitting: Internal Medicine

## 2016-02-02 VITALS — BP 118/64 | HR 94 | Temp 98.2°F | Ht 67.0 in | Wt 207.4 lb

## 2016-02-02 DIAGNOSIS — J3089 Other allergic rhinitis: Secondary | ICD-10-CM | POA: Diagnosis not present

## 2016-02-02 DIAGNOSIS — Z87442 Personal history of urinary calculi: Secondary | ICD-10-CM | POA: Diagnosis not present

## 2016-02-02 DIAGNOSIS — R300 Dysuria: Secondary | ICD-10-CM | POA: Diagnosis not present

## 2016-02-02 DIAGNOSIS — F902 Attention-deficit hyperactivity disorder, combined type: Secondary | ICD-10-CM | POA: Diagnosis not present

## 2016-02-02 DIAGNOSIS — J301 Allergic rhinitis due to pollen: Secondary | ICD-10-CM | POA: Diagnosis not present

## 2016-02-02 DIAGNOSIS — J3081 Allergic rhinitis due to animal (cat) (dog) hair and dander: Secondary | ICD-10-CM | POA: Diagnosis not present

## 2016-02-02 LAB — POC URINALSYSI DIPSTICK (AUTOMATED)
Bilirubin, UA: NEGATIVE
Glucose, UA: NEGATIVE
Ketones, UA: NEGATIVE
Leukocytes, UA: NEGATIVE
Nitrite, UA: NEGATIVE
Protein, UA: NEGATIVE
Spec Grav, UA: 1.025
Urobilinogen, UA: 0.2
pH, UA: 5.5

## 2016-02-02 MED ORDER — AMPHETAMINE-DEXTROAMPHET ER 20 MG PO CP24: 20.0000 mg | ORAL_CAPSULE | Freq: Every morning | ORAL | 0 refills | Status: DC

## 2016-02-02 NOTE — Progress Notes (Signed)
Subjective:    Patient ID: Adam Terry, male    DOB: 02-03-1975, 41 y.o.   MRN: [id number]  HPI  41 year old patient who has a history of hypertension and also nephrolithiasis.  For the past few weeks he has had some dysuria described as a burning sensation.  He has had some episodes of frequency and urgency.  He feels that he passed a kidney stone 2 weeks ago.  Symptoms have improved nicely over the past 2 days. He has a history of asthma/allergic rhinitis which has been stable. His blood pressure has also been well-controlled.  Past Medical History:  Diagnosis Date  . ADHD (attention deficit hyperactivity disorder)   . ALLERGIC RHINITIS 09/17/2007  . Allergy   . Hypertension   . NEPHROLITHIASIS, HX OF 10/20/2007  . STONE, URINARY CALCULUS,UNSPEC. 09/17/2007     Social History   Social History  . Marital status: Married    Spouse name: N/A  . Number of children: N/A  . Years of education: N/A   Occupational History  . Not on file.   Social History Main Topics  . Smoking status: Never Smoker  . Smokeless tobacco: Never Used  . Alcohol use No  . Drug use: No  . Sexual activity: Not on file   Other Topics Concern  . Not on file   Social History Narrative  . No narrative on file    Past Surgical History:  Procedure Laterality Date  . TONSILLECTOMY      Family History  Problem Relation Age of Onset  . Urolithiasis Mother   . Leukemia Maternal Uncle   . Heart disease Maternal Grandmother     Allergies  Allergen Reactions  . Penicillins     REACTION: hives, throat closes  . Sulfamethoxazole     REACTION: hives, throat closes    Current Outpatient Prescriptions on File Prior to Visit  Medication Sig Dispense Refill  . amLODipine-valsartan (EXFORGE) 5-320 MG tablet Take 1 tablet by mouth daily. 90 tablet 2  . amphetamine-dextroamphetamine (ADDERALL XR) 20 MG 24 hr capsule Take 1 capsule (20 mg total) by mouth every morning. 30 capsule 0  .  aspirin-acetaminophen-caffeine (EXCEDRIN MIGRAINE) [id number] MG per tablet Take 1 tablet by mouth every 6 (six) hours as needed for headache.    Marland Kitchen azelastine (ASTELIN) 0.1 % nasal spray Place 2 sprays into both nostrils 2 (two) times daily. Use in each nostril as directed 30 mL 12  . budesonide-formoterol (SYMBICORT) 160-4.5 MCG/ACT inhaler Inhale 2 puffs into the lungs 2 (two) times daily. 1 Inhaler 3  . levocetirizine (XYZAL) 5 MG tablet Take 1 tablet (5 mg total) by mouth daily. (Patient taking differently: Take 5 mg by mouth daily as needed. ) 60 tablet 4  . montelukast (SINGULAIR) 10 MG tablet Take 1 tablet (10 mg total) by mouth at bedtime. 30 tablet 3   No current facility-administered medications on file prior to visit.     BP 118/64 (BP Location: Right Arm, Patient Position: Sitting, Cuff Size: Normal)   Pulse 94   Temp 98.2 F (36.8 C) (Oral)   Ht 5\' 7"  (1.702 m)   Wt 207 lb 6.4 oz (94.1 kg)   SpO2 98%   BMI 32.48 kg/m     Review of Systems  Constitutional: Negative for appetite change, chills, fatigue and fever.  HENT: Negative for congestion, dental problem, ear pain, hearing loss, sore throat, tinnitus, trouble swallowing and voice change.   Eyes: Negative for pain, discharge and visual  disturbance.  Respiratory: Negative for cough, chest tightness, wheezing and stridor.   Cardiovascular: Negative for chest pain, palpitations and leg swelling.  Gastrointestinal: Negative for abdominal distention, abdominal pain, blood in stool, constipation, diarrhea, nausea and vomiting.  Genitourinary: Positive for difficulty urinating, dysuria, frequency and urgency. Negative for discharge, flank pain, genital sores and hematuria.  Musculoskeletal: Negative for arthralgias, back pain, gait problem, joint swelling, myalgias and neck stiffness.  Skin: Negative for rash.  Neurological: Negative for dizziness, syncope, speech difficulty, weakness, numbness and headaches.  Hematological:  Negative for adenopathy. Does not bruise/bleed easily.  Psychiatric/Behavioral: Negative for behavioral problems and dysphoric mood. The patient is not nervous/anxious.        Objective:   Physical Exam  Constitutional: He is oriented to person, place, and time. He appears well-developed. No distress.   Overweight Blood pressure well controlled  HENT:  Head: Normocephalic.  Right Ear: External ear normal.  Left Ear: External ear normal.  Eyes: Conjunctivae and EOM are normal.  Neck: Normal range of motion.  Cardiovascular: Normal rate and normal heart sounds.   Pulmonary/Chest: Breath sounds normal.  Abdominal: Bowel sounds are normal. He exhibits no distension. There is no tenderness. There is no rebound and no guarding.  No flank or suprapubic tenderness  Musculoskeletal: Normal range of motion. He exhibits no edema or tenderness.  Neurological: He is alert and oriented to person, place, and time.  Psychiatric: He has a normal mood and affect. His behavior is normal.          Assessment & Plan:  Dysuria History of nephrolithiasis Hypertension, well-controlled Asthma, allergic rhinitis, stable  We will review a UA.  Appropriate antibiotic therapy if indicated.  Will force fluids  Nyoka Cowden

## 2016-02-02 NOTE — Patient Instructions (Signed)
Drink as much fluid as you  can tolerate over the next few days  Call or return to clinic prn if these symptoms worsen or fail to improve as anticipated.  

## 2016-02-02 NOTE — Progress Notes (Signed)
Pre visit review using our clinic review tool, if applicable. No additional management support is needed unless otherwise documented below in the visit note. 

## 2016-02-20 MED FILL — AMLODIPINE-VALSARTAN 5-320: 5-320 | 90 days supply | Qty: 90 | Fill #1

## 2016-02-20 MED FILL — DEXTROAMP-AMPHET ER 20 MG C: 20 | 30 days supply | Qty: 30 | Fill #0

## 2016-02-20 MED FILL — SYMBICORT 160-4.5 MCG INH: 160-4.5 | 30 days supply | Qty: 10 | Fill #2

## 2016-02-20 MED FILL — MONTELUKAST SOD 10 MG TAB: 10 | 30 days supply | Qty: 30 | Fill #3

## 2016-03-18 ENCOUNTER — Telehealth: Payer: Self-pay | Admitting: Internal Medicine

## 2016-03-18 MED ORDER — OSELTAMIVIR PHOSPHATE 75 MG PO CAPS
75.0000 mg | ORAL_CAPSULE | Freq: Every day | ORAL | 0 refills | Status: DC
Start: 2016-03-18 — End: 2016-10-08

## 2016-03-18 NOTE — Telephone Encounter (Signed)
See message below, please advise.

## 2016-03-18 NOTE — Telephone Encounter (Signed)
Pt states his 43 yr old daughter has been dx with the flu today. Pt was advised to call his pcp and get tami flu as well.  Pt would like rx called in to :  [facility]/ [address]/

## 2016-03-18 NOTE — Telephone Encounter (Signed)
Rx was sent to [facility] at [address]

## 2016-03-18 NOTE — Telephone Encounter (Signed)
Tamiflu 75 #10 one daily

## 2016-05-06 ENCOUNTER — Other Ambulatory Visit: Payer: Self-pay | Admitting: Internal Medicine

## 2016-05-06 MED FILL — SYMBICORT 160-4.5 MCG INH: 160-4.5 | 30 days supply | Qty: 10 | Fill #3

## 2016-05-06 MED FILL — DEXTROAMP-AMPHET ER 20 MG C: 20 | 30 days supply | Qty: 30 | Fill #0

## 2016-05-07 MED FILL — MONTELUKAST SOD 10 MG TAB: 10 | 30 days supply | Qty: 30 | Fill #0

## 2016-05-07 MED FILL — LEVOCETIRIZINE 5 MG TABLET: 5 | 30 days supply | Qty: 30 | Fill #0

## 2016-09-10 DIAGNOSIS — J3081 Allergic rhinitis due to animal (cat) (dog) hair and dander: Secondary | ICD-10-CM | POA: Diagnosis not present

## 2016-09-10 DIAGNOSIS — J3089 Other allergic rhinitis: Secondary | ICD-10-CM | POA: Diagnosis not present

## 2016-09-10 DIAGNOSIS — J301 Allergic rhinitis due to pollen: Secondary | ICD-10-CM | POA: Diagnosis not present

## 2016-10-03 ENCOUNTER — Other Ambulatory Visit: Payer: Self-pay | Admitting: Internal Medicine

## 2016-10-03 ENCOUNTER — Telehealth: Payer: Self-pay | Admitting: Emergency Medicine

## 2016-10-03 ENCOUNTER — Encounter: Payer: Self-pay | Admitting: Internal Medicine

## 2016-10-03 MED ORDER — AMLODIPINE BESYLATE-VALSARTAN 5-320 MG PO TABS: 1.0000 | ORAL_TABLET | Freq: Every day | ORAL | 0 refills | Status: DC

## 2016-10-03 MED FILL — MONTELUKAST SOD 10 MG TAB: 10 | 30 days supply | Qty: 30 | Fill #1

## 2016-10-03 MED FILL — LEVOCETIRIZINE 5 MG TABLET: 5 | 30 days supply | Qty: 30 | Fill #1

## 2016-10-03 MED FILL — AMLODIPINE-VALSARTAN 5-320: 5-320 | 90 days supply | Qty: 90 | Fill #0

## 2016-10-03 NOTE — Telephone Encounter (Signed)
Spoke with pharmacy regarding patient rx Amlodipine. Patient is now using [facility] instead of [facility]. A 90 day supply has been sent into the pharmacy instead of the 30 day. Pharmacy is going to contact patient to let him know .

## 2016-10-03 NOTE — Telephone Encounter (Signed)
Left a VM for patient to give the office a call back regarding making an appointment for further refills.

## 2016-10-07 ENCOUNTER — Other Ambulatory Visit: Payer: Self-pay | Admitting: Internal Medicine

## 2016-10-07 DIAGNOSIS — J209 Acute bronchitis, unspecified: Secondary | ICD-10-CM

## 2016-10-07 NOTE — Telephone Encounter (Signed)
° ° ° ° °  Pt request refill of the following:   budesonide-formoterol (SYMBICORT) 160-4.5 MCG/ACT inhaler   Phamacy:

## 2016-10-08 ENCOUNTER — Encounter: Payer: Self-pay | Admitting: Internal Medicine

## 2016-10-08 ENCOUNTER — Ambulatory Visit (INDEPENDENT_AMBULATORY_CARE_PROVIDER_SITE_OTHER): Payer: BLUE CROSS/BLUE SHIELD | Admitting: Internal Medicine

## 2016-10-08 VITALS — BP 118/60 | HR 94 | Temp 98.2°F | Ht 67.0 in | Wt 202.6 lb

## 2016-10-08 DIAGNOSIS — I10 Essential (primary) hypertension: Secondary | ICD-10-CM | POA: Diagnosis not present

## 2016-10-08 DIAGNOSIS — J302 Other seasonal allergic rhinitis: Secondary | ICD-10-CM | POA: Diagnosis not present

## 2016-10-08 DIAGNOSIS — Z23 Encounter for immunization: Secondary | ICD-10-CM

## 2016-10-08 MED ORDER — AMPHETAMINE-DEXTROAMPHET ER 20 MG PO CP24: 20.0000 mg | ORAL_CAPSULE | Freq: Every morning | ORAL | 0 refills | Status: DC

## 2016-10-08 MED ORDER — BUDESONIDE-FORMOTEROL FUMARATE 160-4.5 MCG/ACT IN AERO: 2.0000 | INHALATION_SPRAY | Freq: Two times a day (BID) | RESPIRATORY_TRACT | 3 refills | Status: AC

## 2016-10-08 MED ORDER — AMLODIPINE BESYLATE-VALSARTAN 5-320 MG PO TABS: 1.0000 | ORAL_TABLET | Freq: Every day | ORAL | 4 refills | Status: DC

## 2016-10-08 MED FILL — SYMBICORT 160-4.5 MCG INH: 160-4.5 | 30 days supply | Qty: 10 | Fill #0

## 2016-10-08 NOTE — Patient Instructions (Signed)
Limit your sodium (Salt) intake  Please check your blood pressure on a regular basis.  If it is consistently greater than 150/90, please make an office appointment.    It is important that you exercise regularly, at least 20 minutes 3 to 4 times per week.  If you develop chest pain or shortness of breath seek  medical attention.  Return in one year for follow-up  

## 2016-10-08 NOTE — Telephone Encounter (Signed)
Medication was refilled.

## 2016-10-08 NOTE — Progress Notes (Signed)
Subjective:    Patient ID: Adam Terry, male    DOB: [birth year], 41 y.o.   MRN: [id number]  HPI  41 year old patient who is seen today for follow-up.  He is followed by allergy medicine with allergic rhinitis and history of mild asthma.  He receives immunotherapy and continues to do well He has a history of essential hypertension which has been stable.  He does monitor home blood pressure readings with nice results He has a history of ADHD, which has been well controlled with Adderall.  No new concerns or complaints  Past Medical History:  Diagnosis Date  . ADHD (attention deficit hyperactivity disorder)   . ALLERGIC RHINITIS 09/17/2007  . Allergy   . Hypertension   . NEPHROLITHIASIS, HX OF 10/20/2007  . STONE, URINARY CALCULUS,UNSPEC. 09/17/2007     Social History   Social History  . Marital status: Married    Spouse name: N/A  . Number of children: N/A  . Years of education: N/A   Occupational History  . Not on file.   Social History Main Topics  . Smoking status: Never Smoker  . Smokeless tobacco: Never Used  . Alcohol use No  . Drug use: No  . Sexual activity: Not on file   Other Topics Concern  . Not on file   Social History Narrative  . No narrative on file    Past Surgical History:  Procedure Laterality Date  . TONSILLECTOMY      Family History  Problem Relation Age of Onset  . Urolithiasis Mother   . Leukemia Maternal Uncle   . Heart disease Maternal Grandmother     Allergies  Allergen Reactions  . Penicillins     REACTION: hives, throat closes  . Sulfamethoxazole     REACTION: hives, throat closes    Current Outpatient Prescriptions on File Prior to Visit  Medication Sig Dispense Refill  . amLODipine-valsartan (EXFORGE) 5-320 MG tablet Take 1 tablet by mouth daily. 90 tablet 0  . amphetamine-dextroamphetamine (ADDERALL XR) 20 MG 24 hr capsule Take 1 capsule (20 mg total) by mouth every morning. 30 capsule 0  .  aspirin-acetaminophen-caffeine (EXCEDRIN MIGRAINE) 573-220-25 MG per tablet Take 1 tablet by mouth every 6 (six) hours as needed for headache.    Marland Kitchen azelastine (ASTELIN) 0.1 % nasal spray Place 2 sprays into both nostrils 2 (two) times daily. Use in each nostril as directed 30 mL 12  . budesonide-formoterol (SYMBICORT) 160-4.5 MCG/ACT inhaler Inhale 2 puffs into the lungs 2 (two) times daily. 1 Inhaler 3  . levocetirizine (XYZAL) 5 MG tablet TAKE 1 TABLET BY MOUTH ONCE DAILY 60 tablet 4  . montelukast (SINGULAIR) 10 MG tablet TAKE 1 TABLET BY MOUTH AT BEDTIME 30 tablet 3   No current facility-administered medications on file prior to visit.     BP 118/60 (BP Location: Left Arm, Patient Position: Sitting, Cuff Size: Normal)   Pulse 94   Temp 98.2 F (36.8 C) (Oral)   Ht 5\' 7"  (1.702 m)   Wt 202 lb 9.6 oz (91.9 kg)   SpO2 98%   BMI 31.73 kg/m     Review of Systems  Constitutional: Negative for appetite change, chills, fatigue and fever.  HENT: Negative for congestion, dental problem, ear pain, hearing loss, sore throat, tinnitus, trouble swallowing and voice change.   Eyes: Negative for pain, discharge and visual disturbance.  Respiratory: Negative for cough, chest tightness, wheezing and stridor.   Cardiovascular: Negative for chest pain, palpitations and leg  swelling.  Gastrointestinal: Negative for abdominal distention, abdominal pain, blood in stool, constipation, diarrhea, nausea and vomiting.  Genitourinary: Negative for difficulty urinating, discharge, flank pain, genital sores, hematuria and urgency.  Musculoskeletal: Negative for arthralgias, back pain, gait problem, joint swelling, myalgias and neck stiffness.  Skin: Negative for rash.  Neurological: Negative for dizziness, syncope, speech difficulty, weakness, numbness and headaches.  Hematological: Negative for adenopathy. Does not bruise/bleed easily.  Psychiatric/Behavioral: Negative for behavioral problems and dysphoric  mood. The patient is not nervous/anxious.        Objective:   Physical Exam  Constitutional: He is oriented to person, place, and time. He appears well-developed.  Overweight Blood pressure 120/84  HENT:  Head: Normocephalic.  Right Ear: External ear normal.  Left Ear: External ear normal.  Eyes: Conjunctivae and EOM are normal.  Neck: Normal range of motion.  Cardiovascular: Normal rate and normal heart sounds.   Pulmonary/Chest: Breath sounds normal.  Abdominal: Bowel sounds are normal.  Musculoskeletal: Normal range of motion. He exhibits no edema or tenderness.  Neurological: He is alert and oriented to person, place, and time.  Psychiatric: He has a normal mood and affect. His behavior is normal.          Assessment & Plan:   Essential hypertension.  No change in therapy.  Medications updated ADHD.  Adderall refilled Overweight.  Modest weight loss encouraged Allergic rhinitis.  Follow-up allergy medicine  CPX one year  Nyoka Cowden

## 2016-10-09 DIAGNOSIS — J3081 Allergic rhinitis due to animal (cat) (dog) hair and dander: Secondary | ICD-10-CM | POA: Diagnosis not present

## 2016-10-09 DIAGNOSIS — J301 Allergic rhinitis due to pollen: Secondary | ICD-10-CM | POA: Diagnosis not present

## 2016-10-09 DIAGNOSIS — J3089 Other allergic rhinitis: Secondary | ICD-10-CM | POA: Diagnosis not present

## 2016-10-09 MED FILL — DEXTROAMP-AMPHET ER 20 MG C: 20 | 30 days supply | Qty: 30 | Fill #0

## 2016-10-14 DIAGNOSIS — H2513 Age-related nuclear cataract, bilateral: Secondary | ICD-10-CM | POA: Diagnosis not present

## 2016-10-14 DIAGNOSIS — H31092 Other chorioretinal scars, left eye: Secondary | ICD-10-CM | POA: Diagnosis not present

## 2016-10-15 DIAGNOSIS — J301 Allergic rhinitis due to pollen: Secondary | ICD-10-CM | POA: Diagnosis not present

## 2016-10-15 DIAGNOSIS — J3081 Allergic rhinitis due to animal (cat) (dog) hair and dander: Secondary | ICD-10-CM | POA: Diagnosis not present

## 2016-10-16 DIAGNOSIS — J3089 Other allergic rhinitis: Secondary | ICD-10-CM | POA: Diagnosis not present

## 2016-10-24 ENCOUNTER — Telehealth: Payer: Self-pay | Admitting: Internal Medicine

## 2016-10-24 NOTE — Telephone Encounter (Signed)
Pt states he has an upper resp issue going on. Pt uses the symbicort, but wants to know if Dr Raliegh Ip will  Prescribe a rescue inhaler for in between.  No fever, just drainage which causes him to cough.  [place], [place]

## 2016-10-24 NOTE — Telephone Encounter (Signed)
Please call in a new prescription for albuterol MDI and make patient aware.  Directions 2 inhalations every 6 hours as needed for shortness of breath or wheezing

## 2016-10-24 NOTE — Telephone Encounter (Signed)
Please advise 

## 2016-10-25 MED ORDER — ALBUTEROL SULFATE HFA 108 (90 BASE) MCG/ACT IN AERS: 2.0000 | INHALATION_SPRAY | Freq: Four times a day (QID) | RESPIRATORY_TRACT | 0 refills | Status: AC | PRN

## 2016-10-25 NOTE — Telephone Encounter (Signed)
Left message on vm Rx sent in.

## 2016-10-25 NOTE — Telephone Encounter (Signed)
Rx sent Pt advised 

## 2016-11-19 DIAGNOSIS — H1045 Other chronic allergic conjunctivitis: Secondary | ICD-10-CM | POA: Diagnosis not present

## 2016-11-19 DIAGNOSIS — J3081 Allergic rhinitis due to animal (cat) (dog) hair and dander: Secondary | ICD-10-CM | POA: Diagnosis not present

## 2016-11-19 DIAGNOSIS — J3089 Other allergic rhinitis: Secondary | ICD-10-CM | POA: Diagnosis not present

## 2016-11-19 DIAGNOSIS — J301 Allergic rhinitis due to pollen: Secondary | ICD-10-CM | POA: Diagnosis not present

## 2016-11-19 DIAGNOSIS — J302 Other seasonal allergic rhinitis: Secondary | ICD-10-CM | POA: Diagnosis not present

## 2016-11-27 DIAGNOSIS — J301 Allergic rhinitis due to pollen: Secondary | ICD-10-CM | POA: Diagnosis not present

## 2016-11-27 DIAGNOSIS — J3081 Allergic rhinitis due to animal (cat) (dog) hair and dander: Secondary | ICD-10-CM | POA: Diagnosis not present

## 2016-11-27 DIAGNOSIS — J3089 Other allergic rhinitis: Secondary | ICD-10-CM | POA: Diagnosis not present

## 2016-11-29 DIAGNOSIS — J301 Allergic rhinitis due to pollen: Secondary | ICD-10-CM | POA: Diagnosis not present

## 2016-11-29 DIAGNOSIS — J3081 Allergic rhinitis due to animal (cat) (dog) hair and dander: Secondary | ICD-10-CM | POA: Diagnosis not present

## 2016-11-29 DIAGNOSIS — J3089 Other allergic rhinitis: Secondary | ICD-10-CM | POA: Diagnosis not present

## 2016-12-02 DIAGNOSIS — J3089 Other allergic rhinitis: Secondary | ICD-10-CM | POA: Diagnosis not present

## 2016-12-02 DIAGNOSIS — J3081 Allergic rhinitis due to animal (cat) (dog) hair and dander: Secondary | ICD-10-CM | POA: Diagnosis not present

## 2016-12-02 DIAGNOSIS — J301 Allergic rhinitis due to pollen: Secondary | ICD-10-CM | POA: Diagnosis not present

## 2016-12-10 DIAGNOSIS — J301 Allergic rhinitis due to pollen: Secondary | ICD-10-CM | POA: Diagnosis not present

## 2016-12-10 DIAGNOSIS — J3089 Other allergic rhinitis: Secondary | ICD-10-CM | POA: Diagnosis not present

## 2016-12-10 DIAGNOSIS — J3081 Allergic rhinitis due to animal (cat) (dog) hair and dander: Secondary | ICD-10-CM | POA: Diagnosis not present

## 2016-12-13 DIAGNOSIS — J3081 Allergic rhinitis due to animal (cat) (dog) hair and dander: Secondary | ICD-10-CM | POA: Diagnosis not present

## 2016-12-13 DIAGNOSIS — J3089 Other allergic rhinitis: Secondary | ICD-10-CM | POA: Diagnosis not present

## 2016-12-13 DIAGNOSIS — J301 Allergic rhinitis due to pollen: Secondary | ICD-10-CM | POA: Diagnosis not present

## 2016-12-18 DIAGNOSIS — J3089 Other allergic rhinitis: Secondary | ICD-10-CM | POA: Diagnosis not present

## 2016-12-18 DIAGNOSIS — J301 Allergic rhinitis due to pollen: Secondary | ICD-10-CM | POA: Diagnosis not present

## 2016-12-18 DIAGNOSIS — J3081 Allergic rhinitis due to animal (cat) (dog) hair and dander: Secondary | ICD-10-CM | POA: Diagnosis not present

## 2016-12-30 DIAGNOSIS — J301 Allergic rhinitis due to pollen: Secondary | ICD-10-CM | POA: Diagnosis not present

## 2016-12-30 DIAGNOSIS — J3089 Other allergic rhinitis: Secondary | ICD-10-CM | POA: Diagnosis not present

## 2016-12-30 DIAGNOSIS — J3081 Allergic rhinitis due to animal (cat) (dog) hair and dander: Secondary | ICD-10-CM | POA: Diagnosis not present

## 2017-01-03 DIAGNOSIS — J301 Allergic rhinitis due to pollen: Secondary | ICD-10-CM | POA: Diagnosis not present

## 2017-01-03 DIAGNOSIS — J3081 Allergic rhinitis due to animal (cat) (dog) hair and dander: Secondary | ICD-10-CM | POA: Diagnosis not present

## 2017-01-03 DIAGNOSIS — J3089 Other allergic rhinitis: Secondary | ICD-10-CM | POA: Diagnosis not present

## 2017-01-10 DIAGNOSIS — J301 Allergic rhinitis due to pollen: Secondary | ICD-10-CM | POA: Diagnosis not present

## 2017-01-10 DIAGNOSIS — J3081 Allergic rhinitis due to animal (cat) (dog) hair and dander: Secondary | ICD-10-CM | POA: Diagnosis not present

## 2017-01-10 DIAGNOSIS — J3089 Other allergic rhinitis: Secondary | ICD-10-CM | POA: Diagnosis not present

## 2017-01-22 DIAGNOSIS — J3081 Allergic rhinitis due to animal (cat) (dog) hair and dander: Secondary | ICD-10-CM | POA: Diagnosis not present

## 2017-01-22 DIAGNOSIS — J3089 Other allergic rhinitis: Secondary | ICD-10-CM | POA: Diagnosis not present

## 2017-01-22 DIAGNOSIS — J301 Allergic rhinitis due to pollen: Secondary | ICD-10-CM | POA: Diagnosis not present

## 2017-02-05 DIAGNOSIS — J3081 Allergic rhinitis due to animal (cat) (dog) hair and dander: Secondary | ICD-10-CM | POA: Diagnosis not present

## 2017-02-05 DIAGNOSIS — J301 Allergic rhinitis due to pollen: Secondary | ICD-10-CM | POA: Diagnosis not present

## 2017-02-05 DIAGNOSIS — J3089 Other allergic rhinitis: Secondary | ICD-10-CM | POA: Diagnosis not present

## 2017-02-11 DIAGNOSIS — J301 Allergic rhinitis due to pollen: Secondary | ICD-10-CM | POA: Diagnosis not present

## 2017-02-11 DIAGNOSIS — J3089 Other allergic rhinitis: Secondary | ICD-10-CM | POA: Diagnosis not present

## 2017-02-11 DIAGNOSIS — J3081 Allergic rhinitis due to animal (cat) (dog) hair and dander: Secondary | ICD-10-CM | POA: Diagnosis not present

## 2017-02-14 DIAGNOSIS — J3081 Allergic rhinitis due to animal (cat) (dog) hair and dander: Secondary | ICD-10-CM | POA: Diagnosis not present

## 2017-02-14 DIAGNOSIS — J301 Allergic rhinitis due to pollen: Secondary | ICD-10-CM | POA: Diagnosis not present

## 2017-02-14 DIAGNOSIS — J3089 Other allergic rhinitis: Secondary | ICD-10-CM | POA: Diagnosis not present

## 2017-02-19 DIAGNOSIS — J3089 Other allergic rhinitis: Secondary | ICD-10-CM | POA: Diagnosis not present

## 2017-02-19 DIAGNOSIS — J301 Allergic rhinitis due to pollen: Secondary | ICD-10-CM | POA: Diagnosis not present

## 2017-02-19 DIAGNOSIS — J3081 Allergic rhinitis due to animal (cat) (dog) hair and dander: Secondary | ICD-10-CM | POA: Diagnosis not present

## 2017-02-27 DIAGNOSIS — J301 Allergic rhinitis due to pollen: Secondary | ICD-10-CM | POA: Diagnosis not present

## 2017-02-27 DIAGNOSIS — J3081 Allergic rhinitis due to animal (cat) (dog) hair and dander: Secondary | ICD-10-CM | POA: Diagnosis not present

## 2017-02-27 DIAGNOSIS — J3089 Other allergic rhinitis: Secondary | ICD-10-CM | POA: Diagnosis not present

## 2017-03-05 DIAGNOSIS — J3089 Other allergic rhinitis: Secondary | ICD-10-CM | POA: Diagnosis not present

## 2017-03-05 DIAGNOSIS — J3081 Allergic rhinitis due to animal (cat) (dog) hair and dander: Secondary | ICD-10-CM | POA: Diagnosis not present

## 2017-03-05 DIAGNOSIS — J301 Allergic rhinitis due to pollen: Secondary | ICD-10-CM | POA: Diagnosis not present

## 2017-03-12 ENCOUNTER — Telehealth: Payer: Self-pay | Admitting: Internal Medicine

## 2017-03-12 NOTE — Telephone Encounter (Signed)
Copied from [place]. Topic: Quick Communication - Rx Refill/Question >> Mar 12, 2017 11:07 AM Neva Seat wrote: Montelukast 10 mg Levocetirizine 5 mg  No refills - needing new refills  [address], [place] [address] Phone: [phone] Fax: [phone]

## 2017-03-13 MED ORDER — MONTELUKAST SODIUM 10 MG PO TABS: 10.0000 mg | ORAL_TABLET | Freq: Every day | ORAL | 6 refills | Status: AC

## 2017-03-13 MED ORDER — LEVOCETIRIZINE DIHYDROCHLORIDE 5 MG PO TABS: 5.0000 mg | ORAL_TABLET | Freq: Every day | ORAL | 4 refills | Status: AC

## 2017-03-13 NOTE — Telephone Encounter (Signed)
Montelukast refill Last OV: 10/08/16 Last Refill:05/07/16 Pharmacy:[facility]  Levocetirizine refill Last OV: 10/08/16 Last Refill:05/07/16 Pharmacy:[facility]

## 2017-03-18 ENCOUNTER — Ambulatory Visit: Payer: BLUE CROSS/BLUE SHIELD | Admitting: Adult Health

## 2017-03-18 ENCOUNTER — Encounter: Payer: Self-pay | Admitting: Adult Health

## 2017-03-18 VITALS — BP 120/84 | HR 81 | Temp 97.9°F | Wt 207.7 lb

## 2017-03-18 DIAGNOSIS — J4521 Mild intermittent asthma with (acute) exacerbation: Secondary | ICD-10-CM | POA: Diagnosis not present

## 2017-03-18 MED ORDER — PREDNISONE 10 MG PO TABS
ORAL_TABLET | ORAL | 0 refills | Status: DC
Start: 2017-03-18 — End: 2018-07-31

## 2017-03-18 MED ORDER — HYDROCODONE-HOMATROPINE 5-1.5 MG/5ML PO SYRP: 5.0000 mL | ORAL_SOLUTION | Freq: Three times a day (TID) | ORAL | 0 refills | Status: DC | PRN

## 2017-03-18 NOTE — Progress Notes (Signed)
Subjective:    Patient ID: Adam Terry, male    DOB: January 28, 1975, 42 y.o.   MRN: [id number]  HPI  42 year old male who  has a past medical history of ADHD (attention deficit hyperactivity disorder), ALLERGIC RHINITIS (09/17/2007), Allergy, Hypertension, NEPHROLITHIASIS, HX OF (10/20/2007), and STONE, URINARY CALCULUS,UNSPEC. (09/17/2007).  He presents to the office today for possible asthma exacerbation. Reports cough for one week with episodes of wheezing and SOB, and chest congestion. Denies any fevers.   He has been using his inhalers and taking singular as directed   Review of Systems See HPI   Past Medical History:  Diagnosis Date  . ADHD (attention deficit hyperactivity disorder)   . ALLERGIC RHINITIS 09/17/2007  . Allergy   . Hypertension   . NEPHROLITHIASIS, HX OF 10/20/2007  . STONE, URINARY CALCULUS,UNSPEC. 09/17/2007    Social History   Socioeconomic History  . Marital status: Married    Spouse name: Not on file  . Number of children: Not on file  . Years of education: Not on file  . Highest education level: Not on file  Social Needs  . Financial resource strain: Not on file  . Food insecurity - worry: Not on file  . Food insecurity - inability: Not on file  . Transportation needs - medical: Not on file  . Transportation needs - non-medical: Not on file  Occupational History  . Not on file  Tobacco Use  . Smoking status: Never Smoker  . Smokeless tobacco: Never Used  Substance and Sexual Activity  . Alcohol use: No  . Drug use: No  . Sexual activity: Not on file  Other Topics Concern  . Not on file  Social History Narrative  . Not on file    Past Surgical History:  Procedure Laterality Date  . TONSILLECTOMY      Family History  Problem Relation Age of Onset  . Urolithiasis Mother   . Leukemia Maternal Uncle   . Heart disease Maternal Grandmother     Allergies  Allergen Reactions  . Penicillins     REACTION: hives, throat closes  .  Sulfamethoxazole     REACTION: hives, throat closes    Current Outpatient Medications on File Prior to Visit  Medication Sig Dispense Refill  . albuterol (PROVENTIL HFA;VENTOLIN HFA) 108 (90 Base) MCG/ACT inhaler Inhale 2 puffs into the lungs every 6 (six) hours as needed for wheezing or shortness of breath. 1 Inhaler 0  . amLODipine-valsartan (EXFORGE) 5-320 MG tablet Take 1 tablet by mouth daily. 90 tablet 4  . amphetamine-dextroamphetamine (ADDERALL XR) 20 MG 24 hr capsule Take 1 capsule (20 mg total) by mouth every morning. 30 capsule 0  . aspirin-acetaminophen-caffeine (EXCEDRIN MIGRAINE) 062-376-28 MG per tablet Take 1 tablet by mouth every 6 (six) hours as needed for headache.    Marland Kitchen azelastine (ASTELIN) 0.1 % nasal spray Place 2 sprays into both nostrils 2 (two) times daily. Use in each nostril as directed 30 mL 12  . budesonide-formoterol (SYMBICORT) 160-4.5 MCG/ACT inhaler Inhale 2 puffs into the lungs 2 (two) times daily. 1 Inhaler 3  . levocetirizine (XYZAL) 5 MG tablet Take 1 tablet (5 mg total) by mouth daily. 60 tablet 4  . montelukast (SINGULAIR) 10 MG tablet Take 1 tablet (10 mg total) by mouth at bedtime. 30 tablet 6   No current facility-administered medications on file prior to visit.     BP 120/84 (BP Location: Left Arm, Patient Position: Sitting, Cuff Size: Normal)  Pulse 81   Temp 97.9 F (36.6 C) (Oral)   Wt 207 lb 11.2 oz (94.2 kg)   SpO2 98%   BMI 32.53 kg/m       Objective:   Physical Exam  Constitutional: He appears well-developed and well-nourished. No distress.  Cardiovascular: Normal rate, regular rhythm, normal heart sounds and intact distal pulses. Exam reveals no gallop and no friction rub.  No murmur heard. Pulmonary/Chest: Effort normal. He has wheezes (trace wheezes throughout ). He has no rales. He exhibits no tenderness.  Skin: Skin is warm and dry. He is not diaphoretic.  Psychiatric: He has a normal mood and affect. His behavior is normal.  Judgment and thought content normal.  Nursing note and vitals reviewed.     Assessment & Plan:  1. Mild intermittent asthma with acute exacerbation - predniSONE (DELTASONE) 10 MG tablet; 40 mg x 3 days, 20 mg x 3 days, 10 mg x 3 days  Dispense: 21 tablet; Refill: 0 - HYDROcodone-homatropine (HYCODAN) 5-1.5 MG/5ML syrup; Take 5 mLs by mouth every 8 (eight) hours as needed for cough.  Dispense: 120 mL; Refill: 0 - Follow up with PCP as needed  Dorothyann Peng, NP

## 2017-04-02 DIAGNOSIS — J3081 Allergic rhinitis due to animal (cat) (dog) hair and dander: Secondary | ICD-10-CM | POA: Diagnosis not present

## 2017-04-02 DIAGNOSIS — J301 Allergic rhinitis due to pollen: Secondary | ICD-10-CM | POA: Diagnosis not present

## 2017-04-02 DIAGNOSIS — J3089 Other allergic rhinitis: Secondary | ICD-10-CM | POA: Diagnosis not present

## 2017-04-09 DIAGNOSIS — J3089 Other allergic rhinitis: Secondary | ICD-10-CM | POA: Diagnosis not present

## 2017-04-09 DIAGNOSIS — J301 Allergic rhinitis due to pollen: Secondary | ICD-10-CM | POA: Diagnosis not present

## 2017-04-09 DIAGNOSIS — J3081 Allergic rhinitis due to animal (cat) (dog) hair and dander: Secondary | ICD-10-CM | POA: Diagnosis not present

## 2017-04-16 DIAGNOSIS — J301 Allergic rhinitis due to pollen: Secondary | ICD-10-CM | POA: Diagnosis not present

## 2017-04-16 DIAGNOSIS — J3089 Other allergic rhinitis: Secondary | ICD-10-CM | POA: Diagnosis not present

## 2017-04-16 DIAGNOSIS — J3081 Allergic rhinitis due to animal (cat) (dog) hair and dander: Secondary | ICD-10-CM | POA: Diagnosis not present

## 2017-04-23 DIAGNOSIS — J3089 Other allergic rhinitis: Secondary | ICD-10-CM | POA: Diagnosis not present

## 2017-04-23 DIAGNOSIS — J3081 Allergic rhinitis due to animal (cat) (dog) hair and dander: Secondary | ICD-10-CM | POA: Diagnosis not present

## 2017-04-23 DIAGNOSIS — J301 Allergic rhinitis due to pollen: Secondary | ICD-10-CM | POA: Diagnosis not present

## 2017-04-30 DIAGNOSIS — J301 Allergic rhinitis due to pollen: Secondary | ICD-10-CM | POA: Diagnosis not present

## 2017-04-30 DIAGNOSIS — J3089 Other allergic rhinitis: Secondary | ICD-10-CM | POA: Diagnosis not present

## 2017-04-30 DIAGNOSIS — J3081 Allergic rhinitis due to animal (cat) (dog) hair and dander: Secondary | ICD-10-CM | POA: Diagnosis not present

## 2017-05-07 DIAGNOSIS — J3089 Other allergic rhinitis: Secondary | ICD-10-CM | POA: Diagnosis not present

## 2017-05-07 DIAGNOSIS — J3081 Allergic rhinitis due to animal (cat) (dog) hair and dander: Secondary | ICD-10-CM | POA: Diagnosis not present

## 2017-05-07 DIAGNOSIS — J301 Allergic rhinitis due to pollen: Secondary | ICD-10-CM | POA: Diagnosis not present

## 2017-05-09 DIAGNOSIS — J301 Allergic rhinitis due to pollen: Secondary | ICD-10-CM | POA: Diagnosis not present

## 2017-05-09 DIAGNOSIS — J3081 Allergic rhinitis due to animal (cat) (dog) hair and dander: Secondary | ICD-10-CM | POA: Diagnosis not present

## 2017-05-09 DIAGNOSIS — J3089 Other allergic rhinitis: Secondary | ICD-10-CM | POA: Diagnosis not present

## 2017-05-14 DIAGNOSIS — J301 Allergic rhinitis due to pollen: Secondary | ICD-10-CM | POA: Diagnosis not present

## 2017-05-14 DIAGNOSIS — J3089 Other allergic rhinitis: Secondary | ICD-10-CM | POA: Diagnosis not present

## 2017-05-14 DIAGNOSIS — J3081 Allergic rhinitis due to animal (cat) (dog) hair and dander: Secondary | ICD-10-CM | POA: Diagnosis not present

## 2017-05-20 DIAGNOSIS — J3089 Other allergic rhinitis: Secondary | ICD-10-CM | POA: Diagnosis not present

## 2017-05-20 DIAGNOSIS — J3081 Allergic rhinitis due to animal (cat) (dog) hair and dander: Secondary | ICD-10-CM | POA: Diagnosis not present

## 2017-05-20 DIAGNOSIS — J301 Allergic rhinitis due to pollen: Secondary | ICD-10-CM | POA: Diagnosis not present

## 2017-05-23 DIAGNOSIS — J3089 Other allergic rhinitis: Secondary | ICD-10-CM | POA: Diagnosis not present

## 2017-05-23 DIAGNOSIS — J3081 Allergic rhinitis due to animal (cat) (dog) hair and dander: Secondary | ICD-10-CM | POA: Diagnosis not present

## 2017-05-23 DIAGNOSIS — J301 Allergic rhinitis due to pollen: Secondary | ICD-10-CM | POA: Diagnosis not present

## 2017-06-04 DIAGNOSIS — J3081 Allergic rhinitis due to animal (cat) (dog) hair and dander: Secondary | ICD-10-CM | POA: Diagnosis not present

## 2017-06-04 DIAGNOSIS — J301 Allergic rhinitis due to pollen: Secondary | ICD-10-CM | POA: Diagnosis not present

## 2017-06-04 DIAGNOSIS — J3089 Other allergic rhinitis: Secondary | ICD-10-CM | POA: Diagnosis not present

## 2017-06-06 DIAGNOSIS — J3081 Allergic rhinitis due to animal (cat) (dog) hair and dander: Secondary | ICD-10-CM | POA: Diagnosis not present

## 2017-06-06 DIAGNOSIS — J301 Allergic rhinitis due to pollen: Secondary | ICD-10-CM | POA: Diagnosis not present

## 2017-06-06 DIAGNOSIS — J3089 Other allergic rhinitis: Secondary | ICD-10-CM | POA: Diagnosis not present

## 2017-06-11 DIAGNOSIS — J3081 Allergic rhinitis due to animal (cat) (dog) hair and dander: Secondary | ICD-10-CM | POA: Diagnosis not present

## 2017-06-11 DIAGNOSIS — J301 Allergic rhinitis due to pollen: Secondary | ICD-10-CM | POA: Diagnosis not present

## 2017-06-11 DIAGNOSIS — J3089 Other allergic rhinitis: Secondary | ICD-10-CM | POA: Diagnosis not present

## 2017-06-20 DIAGNOSIS — J301 Allergic rhinitis due to pollen: Secondary | ICD-10-CM | POA: Diagnosis not present

## 2017-06-20 DIAGNOSIS — J3081 Allergic rhinitis due to animal (cat) (dog) hair and dander: Secondary | ICD-10-CM | POA: Diagnosis not present

## 2017-06-20 DIAGNOSIS — J3089 Other allergic rhinitis: Secondary | ICD-10-CM | POA: Diagnosis not present

## 2017-06-24 DIAGNOSIS — J302 Other seasonal allergic rhinitis: Secondary | ICD-10-CM | POA: Diagnosis not present

## 2017-06-24 DIAGNOSIS — J3089 Other allergic rhinitis: Secondary | ICD-10-CM | POA: Diagnosis not present

## 2017-06-24 DIAGNOSIS — J301 Allergic rhinitis due to pollen: Secondary | ICD-10-CM | POA: Diagnosis not present

## 2017-06-24 DIAGNOSIS — J4520 Mild intermittent asthma, uncomplicated: Secondary | ICD-10-CM | POA: Diagnosis not present

## 2017-06-24 DIAGNOSIS — J3081 Allergic rhinitis due to animal (cat) (dog) hair and dander: Secondary | ICD-10-CM | POA: Diagnosis not present

## 2017-06-27 DIAGNOSIS — J301 Allergic rhinitis due to pollen: Secondary | ICD-10-CM | POA: Diagnosis not present

## 2017-06-27 DIAGNOSIS — J3089 Other allergic rhinitis: Secondary | ICD-10-CM | POA: Diagnosis not present

## 2017-06-27 DIAGNOSIS — J3081 Allergic rhinitis due to animal (cat) (dog) hair and dander: Secondary | ICD-10-CM | POA: Diagnosis not present

## 2017-06-30 ENCOUNTER — Encounter: Payer: Self-pay | Admitting: Internal Medicine

## 2017-06-30 DIAGNOSIS — M5412 Radiculopathy, cervical region: Secondary | ICD-10-CM | POA: Diagnosis not present

## 2017-07-16 ENCOUNTER — Telehealth: Payer: Self-pay | Admitting: Internal Medicine

## 2017-07-16 DIAGNOSIS — J3081 Allergic rhinitis due to animal (cat) (dog) hair and dander: Secondary | ICD-10-CM | POA: Diagnosis not present

## 2017-07-16 DIAGNOSIS — J3089 Other allergic rhinitis: Secondary | ICD-10-CM | POA: Diagnosis not present

## 2017-07-16 DIAGNOSIS — J301 Allergic rhinitis due to pollen: Secondary | ICD-10-CM | POA: Diagnosis not present

## 2017-07-16 NOTE — Telephone Encounter (Signed)
Patient needs refills on his medications:  Amlodipine-valsartan 90 Days Adderall XR- 3 mths  Adam Terry on [place]

## 2017-07-21 ENCOUNTER — Telehealth: Payer: Self-pay | Admitting: Internal Medicine

## 2017-07-21 ENCOUNTER — Other Ambulatory Visit: Payer: Self-pay | Admitting: Internal Medicine

## 2017-07-21 MED ORDER — AMLODIPINE BESYLATE-VALSARTAN 5-320 MG PO TABS: 1.0000 | ORAL_TABLET | Freq: Every day | ORAL | 4 refills | Status: DC

## 2017-07-21 MED ORDER — AMPHETAMINE-DEXTROAMPHET ER 20 MG PO CP24: 20.0000 mg | ORAL_CAPSULE | Freq: Every morning | ORAL | 0 refills | Status: DC

## 2017-07-21 MED FILL — AMLODIPINE-VALSARTAN 5-320: 5-320 | 30 days supply | Qty: 30 | Fill #0

## 2017-07-21 MED FILL — DEXTROAMP-AMPHET ER 20 MG C: 20 | 30 days supply | Qty: 30 | Fill #0

## 2017-07-21 NOTE — Telephone Encounter (Signed)
Copied from [place] [id number]. Topic: Quick Communication - See Telephone Encounter >> Jul 21, 2017  4:26 PM Rutherford Nail, NT wrote: CRM for notification. See Telephone encounter for: 07/21/17. Mary with [facility] calling and states that they received a prescription for Adderal xr. Dr Raliegh Ip wrote for 90 capsules/90 day supply. Stanton Kidney states that insurance only covers 30 days. They're not going to refill. Just letting Dr Raliegh Ip know that they filled for 30 days, but for patient to get the other medication a new prescription for 30 day supply will need to be written.  CB#:[phone]

## 2017-07-21 NOTE — Telephone Encounter (Signed)
Never you are listed!  If Rx is okay to refill can you please E-scribe these? Thank you!

## 2017-07-21 NOTE — Telephone Encounter (Signed)
Okay for refill? No one is listed as the patient PCP.  Please advise

## 2017-07-23 NOTE — Telephone Encounter (Signed)
Noted  

## 2017-07-24 DIAGNOSIS — J301 Allergic rhinitis due to pollen: Secondary | ICD-10-CM | POA: Diagnosis not present

## 2017-07-24 DIAGNOSIS — J3081 Allergic rhinitis due to animal (cat) (dog) hair and dander: Secondary | ICD-10-CM | POA: Diagnosis not present

## 2017-07-25 DIAGNOSIS — J3089 Other allergic rhinitis: Secondary | ICD-10-CM | POA: Diagnosis not present

## 2017-08-11 ENCOUNTER — Telehealth: Payer: Self-pay | Admitting: *Deleted

## 2017-08-11 NOTE — Telephone Encounter (Signed)
Called patient and left message to return call to follow-up.

## 2017-08-11 NOTE — Telephone Encounter (Signed)
Patient Name: Adam Terry Gender: Male DOB: [birth year] Age: 42 Y 11 M 27 D Return Phone Number: [phone] (Secondary) Address: City/State/Zip: [place] [address] Client [facility] Night - Client Client Site [facility] - Night Contact Type Call Who Is Calling Patient / Member / Family / Caregiver Call Type Triage / Clinical Relationship To Patient Self Return Phone Number [phone] (Secondary) Chief Complaint CHEST PAIN (>=21 years) - pain, pressure, heaviness or tightness Reason for Call Symptomatic / Request for [place] states he was wondering if office is open tomorrow. He is having asthma and cold. He is needing to get steroids. Chest feels a little tight and having drainage and cough. Translation No Nurse Assessment Nurse: Ronnie Derby, RN, [place] Bamberg Date/Time (Eastern Time): 08/08/2017 8:55:25 PM Confirm and document reason for call. If symptomatic, describe symptoms. ---Caller states he takes Singulair when he is sick, and he takes Symbicort. He has also used his rescue inhaler. He has a cough, and chest tightness. Does the patient have any new or worsening symptoms? ---Yes Will a triage be completed? ---Yes Related visit to physician within the last 2 weeks? ---No Does the PT have any chronic conditions? (i.e. diabetes, asthma, etc.) ---Yes List chronic conditions. ---asthma Is this a behavioral health or substance abuse call? ---No Guidelines Guideline Title Affirmed Question Affirmed Notes Nurse Date/Time Eilene Ghazi Time) Asthma Attack Chest pain Ronnie Derby, RN, Estill Bamberg 08/08/2017 8:57:13 PM Disp. Time Eilene Ghazi Time) Disposition Final User 08/08/2017 8:54:13 PM Send to Urgent Queue Deloria Lair 08/08/2017 9:00:52 PM Go to ED Now Yes Ronnie Derby, RN, Estill Bamberg

## 2017-08-12 DIAGNOSIS — J3089 Other allergic rhinitis: Secondary | ICD-10-CM | POA: Diagnosis not present

## 2017-08-12 DIAGNOSIS — J301 Allergic rhinitis due to pollen: Secondary | ICD-10-CM | POA: Diagnosis not present

## 2017-08-12 DIAGNOSIS — J3081 Allergic rhinitis due to animal (cat) (dog) hair and dander: Secondary | ICD-10-CM | POA: Diagnosis not present

## 2017-08-26 DIAGNOSIS — I10 Essential (primary) hypertension: Secondary | ICD-10-CM | POA: Diagnosis not present

## 2017-08-26 DIAGNOSIS — J3081 Allergic rhinitis due to animal (cat) (dog) hair and dander: Secondary | ICD-10-CM | POA: Diagnosis not present

## 2017-08-26 DIAGNOSIS — J3089 Other allergic rhinitis: Secondary | ICD-10-CM | POA: Diagnosis not present

## 2017-08-26 DIAGNOSIS — Z6832 Body mass index (BMI) 32.0-32.9, adult: Secondary | ICD-10-CM | POA: Diagnosis not present

## 2017-08-26 DIAGNOSIS — M4712 Other spondylosis with myelopathy, cervical region: Secondary | ICD-10-CM | POA: Diagnosis not present

## 2017-08-26 DIAGNOSIS — J301 Allergic rhinitis due to pollen: Secondary | ICD-10-CM | POA: Diagnosis not present

## 2017-09-10 ENCOUNTER — Ambulatory Visit: Payer: BLUE CROSS/BLUE SHIELD | Admitting: Internal Medicine

## 2017-09-10 ENCOUNTER — Encounter: Payer: Self-pay | Admitting: Internal Medicine

## 2017-09-10 VITALS — BP 120/90 | HR 90 | Temp 98.3°F | Wt 205.8 lb

## 2017-09-10 DIAGNOSIS — J4521 Mild intermittent asthma with (acute) exacerbation: Secondary | ICD-10-CM

## 2017-09-10 DIAGNOSIS — J069 Acute upper respiratory infection, unspecified: Secondary | ICD-10-CM

## 2017-09-10 DIAGNOSIS — I10 Essential (primary) hypertension: Secondary | ICD-10-CM

## 2017-09-10 DIAGNOSIS — B9789 Other viral agents as the cause of diseases classified elsewhere: Secondary | ICD-10-CM

## 2017-09-10 MED ORDER — HYDROCODONE-HOMATROPINE 5-1.5 MG/5ML PO SYRP: 5.0000 mL | ORAL_SOLUTION | Freq: Three times a day (TID) | ORAL | 0 refills | Status: DC | PRN

## 2017-09-10 MED FILL — HYDROCODONE-HOMATROPINE SOL: 5-1.5 | 8 days supply | Qty: 120 | Fill #0

## 2017-09-10 NOTE — Progress Notes (Signed)
Subjective:    Patient ID: Adam Terry, male    DOB: Apr 25, 1975, 42 y.o.   MRN: [id number]  HPI 42 year old patient has a history of allergic rhinitis and chronic asthma.  He presents with a 3-day history of head and chest congestion.  He has had some episodic wheezing requiring albuterol rescue.  No fever.  No productive cough maintenance medications include Symbicort. He has considerable rhinorrhea that he feels has been aggravating his cough  Past Medical History:  Diagnosis Date  . ADHD (attention deficit hyperactivity disorder)   . ALLERGIC RHINITIS 09/17/2007  . Allergy   . Hypertension   . NEPHROLITHIASIS, HX OF 10/20/2007  . STONE, URINARY CALCULUS,UNSPEC. 09/17/2007     Social History   Socioeconomic History  . Marital status: Married    Spouse name: Not on file  . Number of children: Not on file  . Years of education: Not on file  . Highest education level: Not on file  Occupational History  . Not on file  Social Needs  . Financial resource strain: Not on file  . Food insecurity:    Worry: Not on file    Inability: Not on file  . Transportation needs:    Medical: Not on file    Non-medical: Not on file  Tobacco Use  . Smoking status: Never Smoker  . Smokeless tobacco: Never Used  Substance and Sexual Activity  . Alcohol use: No  . Drug use: No  . Sexual activity: Not on file  Lifestyle  . Physical activity:    Days per week: Not on file    Minutes per session: Not on file  . Stress: Not on file  Relationships  . Social connections:    Talks on phone: Not on file    Gets together: Not on file    Attends religious service: Not on file    Active member of club or organization: Not on file    Attends meetings of clubs or organizations: Not on file    Relationship status: Not on file  . Intimate partner violence:    Fear of current or ex partner: Not on file    Emotionally abused: Not on file    Physically abused: Not on file    Forced sexual activity:  Not on file  Other Topics Concern  . Not on file  Social History Narrative  . Not on file    Past Surgical History:  Procedure Laterality Date  . TONSILLECTOMY      Family History  Problem Relation Age of Onset  . Urolithiasis Mother   . Leukemia Maternal Uncle   . Heart disease Maternal Grandmother     Allergies  Allergen Reactions  . Penicillins     REACTION: hives, throat closes  . Sulfamethoxazole     REACTION: hives, throat closes    Current Outpatient Medications on File Prior to Visit  Medication Sig Dispense Refill  . albuterol (PROVENTIL HFA;VENTOLIN HFA) 108 (90 Base) MCG/ACT inhaler Inhale 2 puffs into the lungs every 6 (six) hours as needed for wheezing or shortness of breath. 1 Inhaler 0  . amLODipine-valsartan (EXFORGE) 5-320 MG tablet Take 1 tablet by mouth daily. 90 tablet 4  . amphetamine-dextroamphetamine (ADDERALL XR) 20 MG 24 hr capsule Take 1 capsule (20 mg total) by mouth every morning. 90 capsule 0  . aspirin-acetaminophen-caffeine (EXCEDRIN MIGRAINE) 250-250-65 MG per tablet Take 1 tablet by mouth every 6 (six) hours as needed for headache.    Marland Kitchen  azelastine (ASTELIN) 0.1 % nasal spray Place 2 sprays into both nostrils 2 (two) times daily. Use in each nostril as directed 30 mL 12  . budesonide-formoterol (SYMBICORT) 160-4.5 MCG/ACT inhaler Inhale 2 puffs into the lungs 2 (two) times daily. 1 Inhaler 3  . levocetirizine (XYZAL) 5 MG tablet Take 1 tablet (5 mg total) by mouth daily. 60 tablet 4  . montelukast (SINGULAIR) 10 MG tablet Take 1 tablet (10 mg total) by mouth at bedtime. 30 tablet 6  . methocarbamol (ROBAXIN) 500 MG tablet     . predniSONE (DELTASONE) 10 MG tablet 40 mg x 3 days, 20 mg x 3 days, 10 mg x 3 days (Patient not taking: Reported on 09/10/2017) 21 tablet 0   No current facility-administered medications on file prior to visit.     BP 120/90 (BP Location: Right Arm, Patient Position: Sitting, Cuff Size: Normal)   Pulse 90   Temp 98.3  F (36.8 C) (Oral)   Wt 205 lb 12.8 oz (93.4 kg)   SpO2 98%   BMI 32.23 kg/m       Review of Systems  Constitutional: Positive for activity change and appetite change. Negative for chills, fatigue and fever.  HENT: Positive for congestion, postnasal drip and rhinorrhea. Negative for dental problem, ear pain, hearing loss, sore throat, tinnitus, trouble swallowing and voice change.   Eyes: Negative for pain, discharge and visual disturbance.  Respiratory: Positive for cough and wheezing. Negative for chest tightness and stridor.   Cardiovascular: Negative for chest pain, palpitations and leg swelling.  Gastrointestinal: Negative for abdominal distention, abdominal pain, blood in stool, constipation, diarrhea, nausea and vomiting.  Genitourinary: Negative for difficulty urinating, discharge, flank pain, genital sores, hematuria and urgency.  Musculoskeletal: Negative for arthralgias, back pain, gait problem, joint swelling, myalgias and neck stiffness.  Skin: Negative for rash.  Neurological: Negative for dizziness, syncope, speech difficulty, weakness, numbness and headaches.  Hematological: Negative for adenopathy. Does not bruise/bleed easily.  Psychiatric/Behavioral: Negative for behavioral problems and dysphoric mood. The patient is not nervous/anxious.        Objective:   Physical Exam  Constitutional: He is oriented to person, place, and time. He appears well-developed. No distress.  HENT:  Head: Normocephalic.  Right Ear: External ear normal.  Left Ear: External ear normal.  Eyes: Conjunctivae and EOM are normal.  Neck: Normal range of motion.  Cardiovascular: Normal rate and normal heart sounds.  Pulmonary/Chest: Breath sounds normal. He has no wheezes.  Chest is essentially clear  Abdominal: Bowel sounds are normal.  Musculoskeletal: Normal range of motion. He exhibits no edema or tenderness.  Neurological: He is alert and oriented to person, place, and time.    Psychiatric: He has a normal mood and affect. His behavior is normal.          Assessment & Plan:  Viral URI with Cough History of asthma  We will encourage liberal fluid intake and treat symptomatically Will report any new or worsening symptoms  Marletta Lor

## 2017-09-10 NOTE — Patient Instructions (Addendum)
Drink as much fluid as you  can tolerate over the next few days  Hydrate and Humidify  Drink enough water to keep your urine clear or pale yellow. Staying hydrated will help to thin your mucus.  Use a cool mist humidifier to keep the humidity level in your home above 50%.  Inhale steam for 10-15 minutes, 3-4 times a day or as told by your health care provider. You can do this in the bathroom while a hot shower is running.  Limit your exposure to cool or dry air. Rest  Rest as much as possible.

## 2017-10-01 DIAGNOSIS — J3081 Allergic rhinitis due to animal (cat) (dog) hair and dander: Secondary | ICD-10-CM | POA: Diagnosis not present

## 2017-10-01 DIAGNOSIS — J3089 Other allergic rhinitis: Secondary | ICD-10-CM | POA: Diagnosis not present

## 2017-10-01 DIAGNOSIS — J301 Allergic rhinitis due to pollen: Secondary | ICD-10-CM | POA: Diagnosis not present

## 2017-10-08 DIAGNOSIS — J301 Allergic rhinitis due to pollen: Secondary | ICD-10-CM | POA: Diagnosis not present

## 2017-10-08 DIAGNOSIS — J3081 Allergic rhinitis due to animal (cat) (dog) hair and dander: Secondary | ICD-10-CM | POA: Diagnosis not present

## 2017-10-08 DIAGNOSIS — J3089 Other allergic rhinitis: Secondary | ICD-10-CM | POA: Diagnosis not present

## 2017-10-10 DIAGNOSIS — J3081 Allergic rhinitis due to animal (cat) (dog) hair and dander: Secondary | ICD-10-CM | POA: Diagnosis not present

## 2017-10-10 DIAGNOSIS — J3089 Other allergic rhinitis: Secondary | ICD-10-CM | POA: Diagnosis not present

## 2017-10-10 DIAGNOSIS — J301 Allergic rhinitis due to pollen: Secondary | ICD-10-CM | POA: Diagnosis not present

## 2017-10-15 DIAGNOSIS — J301 Allergic rhinitis due to pollen: Secondary | ICD-10-CM | POA: Diagnosis not present

## 2017-10-15 DIAGNOSIS — J3081 Allergic rhinitis due to animal (cat) (dog) hair and dander: Secondary | ICD-10-CM | POA: Diagnosis not present

## 2017-10-15 DIAGNOSIS — J3089 Other allergic rhinitis: Secondary | ICD-10-CM | POA: Diagnosis not present

## 2017-10-17 DIAGNOSIS — J3089 Other allergic rhinitis: Secondary | ICD-10-CM | POA: Diagnosis not present

## 2017-10-17 DIAGNOSIS — J301 Allergic rhinitis due to pollen: Secondary | ICD-10-CM | POA: Diagnosis not present

## 2017-10-17 DIAGNOSIS — J3081 Allergic rhinitis due to animal (cat) (dog) hair and dander: Secondary | ICD-10-CM | POA: Diagnosis not present

## 2017-10-21 DIAGNOSIS — H2513 Age-related nuclear cataract, bilateral: Secondary | ICD-10-CM | POA: Diagnosis not present

## 2017-10-21 DIAGNOSIS — H31092 Other chorioretinal scars, left eye: Secondary | ICD-10-CM | POA: Diagnosis not present

## 2017-11-12 DIAGNOSIS — J3089 Other allergic rhinitis: Secondary | ICD-10-CM | POA: Diagnosis not present

## 2017-11-12 DIAGNOSIS — J3081 Allergic rhinitis due to animal (cat) (dog) hair and dander: Secondary | ICD-10-CM | POA: Diagnosis not present

## 2017-11-12 DIAGNOSIS — J301 Allergic rhinitis due to pollen: Secondary | ICD-10-CM | POA: Diagnosis not present

## 2017-12-05 DIAGNOSIS — J301 Allergic rhinitis due to pollen: Secondary | ICD-10-CM | POA: Diagnosis not present

## 2017-12-05 DIAGNOSIS — J3081 Allergic rhinitis due to animal (cat) (dog) hair and dander: Secondary | ICD-10-CM | POA: Diagnosis not present

## 2017-12-05 DIAGNOSIS — J3089 Other allergic rhinitis: Secondary | ICD-10-CM | POA: Diagnosis not present

## 2017-12-30 DIAGNOSIS — J3081 Allergic rhinitis due to animal (cat) (dog) hair and dander: Secondary | ICD-10-CM | POA: Diagnosis not present

## 2017-12-30 DIAGNOSIS — J301 Allergic rhinitis due to pollen: Secondary | ICD-10-CM | POA: Diagnosis not present

## 2017-12-30 DIAGNOSIS — J3089 Other allergic rhinitis: Secondary | ICD-10-CM | POA: Diagnosis not present

## 2018-01-12 DIAGNOSIS — J3089 Other allergic rhinitis: Secondary | ICD-10-CM | POA: Diagnosis not present

## 2018-01-12 DIAGNOSIS — J3081 Allergic rhinitis due to animal (cat) (dog) hair and dander: Secondary | ICD-10-CM | POA: Diagnosis not present

## 2018-01-12 DIAGNOSIS — J301 Allergic rhinitis due to pollen: Secondary | ICD-10-CM | POA: Diagnosis not present

## 2018-01-26 DIAGNOSIS — J4520 Mild intermittent asthma, uncomplicated: Secondary | ICD-10-CM | POA: Diagnosis not present

## 2018-01-26 DIAGNOSIS — J3081 Allergic rhinitis due to animal (cat) (dog) hair and dander: Secondary | ICD-10-CM | POA: Diagnosis not present

## 2018-01-26 DIAGNOSIS — J3089 Other allergic rhinitis: Secondary | ICD-10-CM | POA: Diagnosis not present

## 2018-01-26 DIAGNOSIS — J301 Allergic rhinitis due to pollen: Secondary | ICD-10-CM | POA: Diagnosis not present

## 2018-01-26 DIAGNOSIS — J302 Other seasonal allergic rhinitis: Secondary | ICD-10-CM | POA: Diagnosis not present

## 2018-03-04 DIAGNOSIS — J3081 Allergic rhinitis due to animal (cat) (dog) hair and dander: Secondary | ICD-10-CM | POA: Diagnosis not present

## 2018-03-04 DIAGNOSIS — J301 Allergic rhinitis due to pollen: Secondary | ICD-10-CM | POA: Diagnosis not present

## 2018-03-04 DIAGNOSIS — J3089 Other allergic rhinitis: Secondary | ICD-10-CM | POA: Diagnosis not present

## 2018-03-12 DIAGNOSIS — J3081 Allergic rhinitis due to animal (cat) (dog) hair and dander: Secondary | ICD-10-CM | POA: Diagnosis not present

## 2018-03-12 DIAGNOSIS — J301 Allergic rhinitis due to pollen: Secondary | ICD-10-CM | POA: Diagnosis not present

## 2018-03-12 DIAGNOSIS — J3089 Other allergic rhinitis: Secondary | ICD-10-CM | POA: Diagnosis not present

## 2018-03-17 DIAGNOSIS — J3089 Other allergic rhinitis: Secondary | ICD-10-CM | POA: Diagnosis not present

## 2018-03-17 DIAGNOSIS — J3081 Allergic rhinitis due to animal (cat) (dog) hair and dander: Secondary | ICD-10-CM | POA: Diagnosis not present

## 2018-03-17 DIAGNOSIS — J301 Allergic rhinitis due to pollen: Secondary | ICD-10-CM | POA: Diagnosis not present

## 2018-03-20 DIAGNOSIS — J3089 Other allergic rhinitis: Secondary | ICD-10-CM | POA: Diagnosis not present

## 2018-03-20 DIAGNOSIS — J301 Allergic rhinitis due to pollen: Secondary | ICD-10-CM | POA: Diagnosis not present

## 2018-03-20 DIAGNOSIS — J3081 Allergic rhinitis due to animal (cat) (dog) hair and dander: Secondary | ICD-10-CM | POA: Diagnosis not present

## 2018-03-31 DIAGNOSIS — J3089 Other allergic rhinitis: Secondary | ICD-10-CM | POA: Diagnosis not present

## 2018-03-31 DIAGNOSIS — J301 Allergic rhinitis due to pollen: Secondary | ICD-10-CM | POA: Diagnosis not present

## 2018-03-31 DIAGNOSIS — J3081 Allergic rhinitis due to animal (cat) (dog) hair and dander: Secondary | ICD-10-CM | POA: Diagnosis not present

## 2018-04-22 DIAGNOSIS — J3081 Allergic rhinitis due to animal (cat) (dog) hair and dander: Secondary | ICD-10-CM | POA: Diagnosis not present

## 2018-04-22 DIAGNOSIS — J3089 Other allergic rhinitis: Secondary | ICD-10-CM | POA: Diagnosis not present

## 2018-04-22 DIAGNOSIS — J301 Allergic rhinitis due to pollen: Secondary | ICD-10-CM | POA: Diagnosis not present

## 2018-05-13 DIAGNOSIS — J301 Allergic rhinitis due to pollen: Secondary | ICD-10-CM | POA: Diagnosis not present

## 2018-05-13 DIAGNOSIS — J3089 Other allergic rhinitis: Secondary | ICD-10-CM | POA: Diagnosis not present

## 2018-05-13 DIAGNOSIS — J3081 Allergic rhinitis due to animal (cat) (dog) hair and dander: Secondary | ICD-10-CM | POA: Diagnosis not present

## 2018-06-03 DIAGNOSIS — J3089 Other allergic rhinitis: Secondary | ICD-10-CM | POA: Diagnosis not present

## 2018-06-03 DIAGNOSIS — J3081 Allergic rhinitis due to animal (cat) (dog) hair and dander: Secondary | ICD-10-CM | POA: Diagnosis not present

## 2018-06-03 DIAGNOSIS — J301 Allergic rhinitis due to pollen: Secondary | ICD-10-CM | POA: Diagnosis not present

## 2018-06-04 DIAGNOSIS — J301 Allergic rhinitis due to pollen: Secondary | ICD-10-CM | POA: Diagnosis not present

## 2018-06-04 DIAGNOSIS — J302 Other seasonal allergic rhinitis: Secondary | ICD-10-CM | POA: Diagnosis not present

## 2018-06-04 DIAGNOSIS — H1045 Other chronic allergic conjunctivitis: Secondary | ICD-10-CM | POA: Diagnosis not present

## 2018-06-04 DIAGNOSIS — J3081 Allergic rhinitis due to animal (cat) (dog) hair and dander: Secondary | ICD-10-CM | POA: Diagnosis not present

## 2018-06-29 DIAGNOSIS — J3081 Allergic rhinitis due to animal (cat) (dog) hair and dander: Secondary | ICD-10-CM | POA: Diagnosis not present

## 2018-06-29 DIAGNOSIS — J3089 Other allergic rhinitis: Secondary | ICD-10-CM | POA: Diagnosis not present

## 2018-06-29 DIAGNOSIS — J301 Allergic rhinitis due to pollen: Secondary | ICD-10-CM | POA: Diagnosis not present

## 2018-07-27 DIAGNOSIS — H1045 Other chronic allergic conjunctivitis: Secondary | ICD-10-CM | POA: Diagnosis not present

## 2018-07-27 DIAGNOSIS — J302 Other seasonal allergic rhinitis: Secondary | ICD-10-CM | POA: Diagnosis not present

## 2018-07-27 DIAGNOSIS — J3081 Allergic rhinitis due to animal (cat) (dog) hair and dander: Secondary | ICD-10-CM | POA: Diagnosis not present

## 2018-07-27 DIAGNOSIS — J3089 Other allergic rhinitis: Secondary | ICD-10-CM | POA: Diagnosis not present

## 2018-07-27 DIAGNOSIS — J301 Allergic rhinitis due to pollen: Secondary | ICD-10-CM | POA: Diagnosis not present

## 2018-07-30 DIAGNOSIS — M4712 Other spondylosis with myelopathy, cervical region: Secondary | ICD-10-CM | POA: Diagnosis not present

## 2018-07-31 ENCOUNTER — Telehealth: Payer: Self-pay | Admitting: Internal Medicine

## 2018-07-31 ENCOUNTER — Other Ambulatory Visit: Payer: Self-pay

## 2018-07-31 ENCOUNTER — Ambulatory Visit (INDEPENDENT_AMBULATORY_CARE_PROVIDER_SITE_OTHER): Payer: BC Managed Care – PPO | Admitting: Internal Medicine

## 2018-07-31 ENCOUNTER — Encounter: Payer: Self-pay | Admitting: Internal Medicine

## 2018-07-31 VITALS — BP 120/80 | HR 67 | Temp 98.3°F | Ht 67.0 in | Wt 208.2 lb

## 2018-07-31 DIAGNOSIS — J4540 Moderate persistent asthma, uncomplicated: Secondary | ICD-10-CM | POA: Diagnosis not present

## 2018-07-31 DIAGNOSIS — E669 Obesity, unspecified: Secondary | ICD-10-CM

## 2018-07-31 DIAGNOSIS — F902 Attention-deficit hyperactivity disorder, combined type: Secondary | ICD-10-CM | POA: Diagnosis not present

## 2018-07-31 DIAGNOSIS — I10 Essential (primary) hypertension: Secondary | ICD-10-CM

## 2018-07-31 MED ORDER — AMPHETAMINE-DEXTROAMPHET ER 20 MG PO CP24: 20.0000 mg | ORAL_CAPSULE | ORAL | 0 refills | Status: DC

## 2018-07-31 MED ORDER — AMLODIPINE BESYLATE-VALSARTAN 5-320 MG PO TABS: 1.0000 | ORAL_TABLET | Freq: Every day | ORAL | 1 refills | Status: DC

## 2018-07-31 MED ORDER — AMPHETAMINE-DEXTROAMPHET ER 20 MG PO CP24: 20.0000 mg | ORAL_CAPSULE | Freq: Every morning | ORAL | 0 refills | Status: DC

## 2018-07-31 MED ORDER — AMLODIPINE BESYLATE-VALSARTAN 5-320 MG PO TABS: 1.0000 | ORAL_TABLET | Freq: Every day | ORAL | 4 refills | Status: DC

## 2018-07-31 MED FILL — ADDERALL XR 20 MG CAP SA: 20 | 30 days supply | Qty: 30 | Fill #0

## 2018-07-31 NOTE — Patient Instructions (Signed)
-  Nice meeting you today!!  -Schedule follow up in 3 months for your annual physical.

## 2018-07-31 NOTE — Telephone Encounter (Signed)
Refill sent.

## 2018-07-31 NOTE — Progress Notes (Signed)
Established Patient Office Visit     CC/Reason for Visit: Establish care, follow-up chronic conditions, medication refills  HPI: Adam Terry is a 43 y.o. male who is coming in today for the above mentioned reasons. Past Medical History is significant for: Hypertension that has been well controlled on amlodipine and valsartan.  He also has a history significant for asthma followed [facility] for which she takes Symbicort, Singulair, Xyzal and rescue Xopenex inhaler.  He has been pretty well controlled recently states he uses his inhaler only once to twice a month.  He has a history of ADHD and has not been on Adderall since around Christmas time because his previous PCP had retired.  He states he has noticed a significant change in his ability to concentrate and focus on tasks at work.  He works in Engineer, technical sales, he is married, has a 36-year-old daughter.  Does not smoke, drinks alcohol only occasionally.   Past Medical/Surgical History: Past Medical History:  Diagnosis Date  . ADHD (attention deficit hyperactivity disorder)   . ALLERGIC RHINITIS 09/17/2007  . Allergy   . Hypertension   . NEPHROLITHIASIS, HX OF 10/20/2007  . STONE, URINARY CALCULUS,UNSPEC. 09/17/2007    Past Surgical History:  Procedure Laterality Date  . TONSILLECTOMY      Social History:  reports that he has never smoked. He has never used smokeless tobacco. He reports that he does not drink alcohol or use drugs.  Allergies: Allergies  Allergen Reactions  . Penicillins     REACTION: hives, throat closes  . Sulfamethoxazole     REACTION: hives, throat closes    Family History:  Family History  Problem Relation Age of Onset  . Urolithiasis Mother   . Leukemia Maternal Uncle   . Heart disease Maternal Grandmother      Current Outpatient Medications:  .  albuterol (PROVENTIL HFA;VENTOLIN HFA) 108 (90 Base) MCG/ACT inhaler, Inhale 2 puffs into the lungs every 6 (six) hours as needed for wheezing or  shortness of breath., Disp: 1 Inhaler, Rfl: 0 .  amLODipine-valsartan (EXFORGE) 5-320 MG tablet, Take 1 tablet by mouth daily., Disp: 90 tablet, Rfl: 4 .  aspirin-acetaminophen-caffeine (EXCEDRIN MIGRAINE) 250-250-65 MG per tablet, Take 1 tablet by mouth every 6 (six) hours as needed for headache., Disp: , Rfl:  .  azelastine (ASTELIN) 0.1 % nasal spray, Place 2 sprays into both nostrils 2 (two) times daily. Use in each nostril as directed, Disp: 30 mL, Rfl: 12 .  budesonide-formoterol (SYMBICORT) 160-4.5 MCG/ACT inhaler, Inhale 2 puffs into the lungs 2 (two) times daily., Disp: 1 Inhaler, Rfl: 3 .  levocetirizine (XYZAL) 5 MG tablet, Take 1 tablet (5 mg total) by mouth daily., Disp: 60 tablet, Rfl: 4 .  montelukast (SINGULAIR) 10 MG tablet, Take 1 tablet (10 mg total) by mouth at bedtime., Disp: 30 tablet, Rfl: 6 .  amphetamine-dextroamphetamine (ADDERALL XR) 20 MG 24 hr capsule, Take 1 capsule (20 mg total) by mouth every morning., Disp: 30 capsule, Rfl: 0 .  amphetamine-dextroamphetamine (ADDERALL XR) 20 MG 24 hr capsule, Take 1 capsule (20 mg total) by mouth every morning., Disp: 30 capsule, Rfl: 0 .  amphetamine-dextroamphetamine (ADDERALL XR) 20 MG 24 hr capsule, Take 1 capsule (20 mg total) by mouth every morning., Disp: 30 capsule, Rfl: 0  Review of Systems:  Constitutional: Denies fever, chills, diaphoresis, appetite change and fatigue.  HEENT: Denies photophobia, eye pain, redness, hearing loss, ear pain, congestion, sore throat, rhinorrhea, sneezing, mouth sores,  trouble swallowing, neck pain, neck stiffness and tinnitus.   Respiratory: Denies SOB, DOE, cough, chest tightness,  and wheezing.   Cardiovascular: Denies chest pain, palpitations and leg swelling.  Gastrointestinal: Denies nausea, vomiting, abdominal pain, diarrhea, constipation, blood in stool and abdominal distention.  Genitourinary: Denies dysuria, urgency, frequency, hematuria, flank pain and difficulty urinating.   Endocrine: Denies: hot or cold intolerance, sweats, changes in hair or nails, polyuria, polydipsia. Musculoskeletal: Denies myalgias, back pain, joint swelling, arthralgias and gait problem.  Skin: Denies pallor, rash and wound.  Neurological: Denies dizziness, seizures, syncope, weakness, light-headedness, numbness and headaches.  Hematological: Denies adenopathy. Easy bruising, personal or family bleeding history  Psychiatric/Behavioral: Denies suicidal ideation, mood changes, confusion, nervousness, sleep disturbance and agitation    Physical Exam: Vitals:   07/31/18 1001  BP: 120/80  Pulse: 67  Temp: 98.3 F (36.8 C)  TempSrc: Oral  SpO2: 95%  Weight: 208 lb 3.2 oz (94.4 kg)  Height: 5\' 7"  (1.702 m)    Body mass index is 32.61 kg/m.   Constitutional: NAD, calm, comfortable Eyes: PERRL, lids and conjunctivae normal, wears corrective lenses ENMT: Mucous membranes are moist. Respiratory: clear to auscultation bilaterally, no wheezing, no crackles. Normal respiratory effort. No accessory muscle use.  Cardiovascular: Regular rate and rhythm, no murmurs / rubs / gallops. No extremity edema. 2+ pedal pulses. No carotid bruits.  Abdomen: no tenderness, no masses palpated. No hepatosplenomegaly. Bowel sounds positive.  Musculoskeletal: no clubbing / cyanosis. No joint deformity upper and lower extremities. Good ROM, no contractures. Normal muscle tone.  Skin: no rashes, lesions, ulcers. No induration Neurologic: Grossly intact and nonfocal  psychiatric: Normal judgment and insight. Alert and oriented x 3. Normal mood.    Impression and Plan:  Attention deficit hyperactivity disorder (ADHD), combined type -I have agreed to refill his Adderall on a 68-month basis, he understands he needs to check in with me every 3 months for follow-up.  Obesity (BMI 30.0-34.9) -Discussed healthy lifestyle, including increased physical activity and better food choices to promote weight loss.   Essential hypertension  -Well-controlled, refill Exforge.  Moderate persistent asthma without complication -Seems stable at the moment, follows with his allergist routinely.    Patient Instructions  -Nice meeting you today!!  -Schedule follow up in 3 months for your annual physical.     Lelon Frohlich, MD [facility]

## 2018-07-31 NOTE — Telephone Encounter (Signed)
Medication Refill - Medication: amLODipine-valsartan (EXFORGE) 5-320 MG tablet    Has the patient contacted their pharmacy? Yes.   (Agent: If no, request that the patient contact the pharmacy for the refill.) (Agent: If yes, when and what did the pharmacy advise?)  Preferred Pharmacy (with phone number or street name): [facility]   Med is too expensive at [facility] , needs sent elsewhere    Agent: Please be advised that RX refills may take up to 3 business days. We ask that you follow-up with your pharmacy.

## 2018-08-24 DIAGNOSIS — J3081 Allergic rhinitis due to animal (cat) (dog) hair and dander: Secondary | ICD-10-CM | POA: Diagnosis not present

## 2018-08-24 DIAGNOSIS — J301 Allergic rhinitis due to pollen: Secondary | ICD-10-CM | POA: Diagnosis not present

## 2018-08-24 DIAGNOSIS — J3089 Other allergic rhinitis: Secondary | ICD-10-CM | POA: Diagnosis not present

## 2018-08-25 DIAGNOSIS — J3089 Other allergic rhinitis: Secondary | ICD-10-CM | POA: Diagnosis not present

## 2018-09-09 DIAGNOSIS — J301 Allergic rhinitis due to pollen: Secondary | ICD-10-CM | POA: Diagnosis not present

## 2018-09-09 DIAGNOSIS — J3089 Other allergic rhinitis: Secondary | ICD-10-CM | POA: Diagnosis not present

## 2018-09-09 DIAGNOSIS — J3081 Allergic rhinitis due to animal (cat) (dog) hair and dander: Secondary | ICD-10-CM | POA: Diagnosis not present

## 2018-09-14 DIAGNOSIS — J3089 Other allergic rhinitis: Secondary | ICD-10-CM | POA: Diagnosis not present

## 2018-09-14 DIAGNOSIS — J301 Allergic rhinitis due to pollen: Secondary | ICD-10-CM | POA: Diagnosis not present

## 2018-09-14 DIAGNOSIS — J3081 Allergic rhinitis due to animal (cat) (dog) hair and dander: Secondary | ICD-10-CM | POA: Diagnosis not present

## 2018-09-25 DIAGNOSIS — J3089 Other allergic rhinitis: Secondary | ICD-10-CM | POA: Diagnosis not present

## 2018-09-25 DIAGNOSIS — J301 Allergic rhinitis due to pollen: Secondary | ICD-10-CM | POA: Diagnosis not present

## 2018-09-25 DIAGNOSIS — J3081 Allergic rhinitis due to animal (cat) (dog) hair and dander: Secondary | ICD-10-CM | POA: Diagnosis not present

## 2018-09-29 DIAGNOSIS — J301 Allergic rhinitis due to pollen: Secondary | ICD-10-CM | POA: Diagnosis not present

## 2018-09-29 DIAGNOSIS — J3089 Other allergic rhinitis: Secondary | ICD-10-CM | POA: Diagnosis not present

## 2018-09-29 DIAGNOSIS — J3081 Allergic rhinitis due to animal (cat) (dog) hair and dander: Secondary | ICD-10-CM | POA: Diagnosis not present

## 2018-10-02 DIAGNOSIS — J3081 Allergic rhinitis due to animal (cat) (dog) hair and dander: Secondary | ICD-10-CM | POA: Diagnosis not present

## 2018-10-02 DIAGNOSIS — J3089 Other allergic rhinitis: Secondary | ICD-10-CM | POA: Diagnosis not present

## 2018-10-02 DIAGNOSIS — J301 Allergic rhinitis due to pollen: Secondary | ICD-10-CM | POA: Diagnosis not present

## 2018-10-07 DIAGNOSIS — J3081 Allergic rhinitis due to animal (cat) (dog) hair and dander: Secondary | ICD-10-CM | POA: Diagnosis not present

## 2018-10-07 DIAGNOSIS — J3089 Other allergic rhinitis: Secondary | ICD-10-CM | POA: Diagnosis not present

## 2018-10-07 DIAGNOSIS — J301 Allergic rhinitis due to pollen: Secondary | ICD-10-CM | POA: Diagnosis not present

## 2018-10-10 ENCOUNTER — Telehealth: Payer: BC Managed Care – PPO | Admitting: Nurse Practitioner

## 2018-10-10 DIAGNOSIS — J069 Acute upper respiratory infection, unspecified: Secondary | ICD-10-CM | POA: Diagnosis not present

## 2018-10-10 DIAGNOSIS — B9789 Other viral agents as the cause of diseases classified elsewhere: Secondary | ICD-10-CM

## 2018-10-10 MED ORDER — BENZONATATE 100 MG PO CAPS: 100.0000 mg | ORAL_CAPSULE | Freq: Three times a day (TID) | ORAL | 0 refills | Status: DC | PRN

## 2018-10-10 MED ORDER — PREDNISONE 10 MG (21) PO TBPK: ORAL_TABLET | ORAL | 0 refills | Status: DC

## 2018-10-10 NOTE — Addendum Note (Signed)
Addended by: Chevis Pretty on: 10/10/2018 10:41 AM   Modules accepted: Orders

## 2018-10-10 NOTE — Progress Notes (Signed)

## 2018-10-10 NOTE — Progress Notes (Signed)
I can give you some steroids. But the cough meds you requested is a controlled drug and we cannot do those in an evisit  Orders Placed This Encounter     benzonatate (TESSALON PERLES) 100 MG capsule         Sig: Take 1 capsule (100 mg total) by mouth 3 (three) times daily as needed.         Dispense:  20 capsule         Refill:  0         Order Specific Question: Supervising Provider         Answer: MILLER, BRIAN [[id number]]     predniSONE (STERAPRED UNI-PAK 21 TAB) 10 MG (21) TBPK tablet         Sig: As directed x 6 days         Dispense:  21 tablet         Refill:  0         Order Specific Question: Supervising Provider         Answer: Noemi Chapel [[id number]]

## 2018-10-27 DIAGNOSIS — J3081 Allergic rhinitis due to animal (cat) (dog) hair and dander: Secondary | ICD-10-CM | POA: Diagnosis not present

## 2018-10-27 DIAGNOSIS — J301 Allergic rhinitis due to pollen: Secondary | ICD-10-CM | POA: Diagnosis not present

## 2018-10-27 DIAGNOSIS — H31092 Other chorioretinal scars, left eye: Secondary | ICD-10-CM | POA: Diagnosis not present

## 2018-10-27 DIAGNOSIS — J3089 Other allergic rhinitis: Secondary | ICD-10-CM | POA: Diagnosis not present

## 2018-10-27 DIAGNOSIS — H2513 Age-related nuclear cataract, bilateral: Secondary | ICD-10-CM | POA: Diagnosis not present

## 2018-11-06 ENCOUNTER — Ambulatory Visit (INDEPENDENT_AMBULATORY_CARE_PROVIDER_SITE_OTHER): Payer: BC Managed Care – PPO | Admitting: Internal Medicine

## 2018-11-06 ENCOUNTER — Encounter: Payer: Self-pay | Admitting: Internal Medicine

## 2018-11-06 ENCOUNTER — Other Ambulatory Visit: Payer: Self-pay | Admitting: Internal Medicine

## 2018-11-06 ENCOUNTER — Other Ambulatory Visit: Payer: Self-pay

## 2018-11-06 VITALS — BP 110/80 | HR 84 | Temp 97.8°F | Ht 68.0 in | Wt 208.8 lb

## 2018-11-06 DIAGNOSIS — I10 Essential (primary) hypertension: Secondary | ICD-10-CM

## 2018-11-06 DIAGNOSIS — E559 Vitamin D deficiency, unspecified: Secondary | ICD-10-CM

## 2018-11-06 DIAGNOSIS — F902 Attention-deficit hyperactivity disorder, combined type: Secondary | ICD-10-CM | POA: Diagnosis not present

## 2018-11-06 DIAGNOSIS — J4541 Moderate persistent asthma with (acute) exacerbation: Secondary | ICD-10-CM | POA: Diagnosis not present

## 2018-11-06 DIAGNOSIS — Z0000 Encounter for general adult medical examination without abnormal findings: Secondary | ICD-10-CM | POA: Diagnosis not present

## 2018-11-06 DIAGNOSIS — Z23 Encounter for immunization: Secondary | ICD-10-CM

## 2018-11-06 LAB — CBC WITH DIFFERENTIAL/PLATELET
Basophils Absolute: 0 10*3/uL (ref 0.0–0.1)
Basophils Relative: 0.8 % (ref 0.0–3.0)
Eosinophils Absolute: 0.1 10*3/uL (ref 0.0–0.7)
Eosinophils Relative: 1.9 % (ref 0.0–5.0)
HCT: 44.6 % (ref 39.0–52.0)
Hemoglobin: 15.1 g/dL (ref 13.0–17.0)
Lymphocytes Relative: 28.9 % (ref 12.0–46.0)
Lymphs Abs: 1.4 10*3/uL (ref 0.7–4.0)
MCHC: 33.7 g/dL (ref 30.0–36.0)
MCV: 89.4 fl (ref 78.0–100.0)
Monocytes Absolute: 0.5 10*3/uL (ref 0.1–1.0)
Monocytes Relative: 10 % (ref 3.0–12.0)
Neutro Abs: 2.8 10*3/uL (ref 1.4–7.7)
Neutrophils Relative %: 58.4 % (ref 43.0–77.0)
Platelets: 232 10*3/uL (ref 150.0–400.0)
RBC: 4.99 Mil/uL (ref 4.22–5.81)
RDW: 14.2 % (ref 11.5–15.5)
WBC: 4.8 10*3/uL (ref 4.0–10.5)

## 2018-11-06 LAB — VITAMIN D 25 HYDROXY (VIT D DEFICIENCY, FRACTURES): VITD: 19.37 ng/mL — ABNORMAL LOW (ref 30.00–100.00)

## 2018-11-06 LAB — COMPREHENSIVE METABOLIC PANEL
ALT: 19 U/L (ref 0–53)
AST: 17 U/L (ref 0–37)
Albumin: 4.5 g/dL (ref 3.5–5.2)
Alkaline Phosphatase: 68 U/L (ref 39–117)
BUN: 12 mg/dL (ref 6–23)
CO2: 30 mEq/L (ref 19–32)
Calcium: 9.2 mg/dL (ref 8.4–10.5)
Chloride: 101 mEq/L (ref 96–112)
Creatinine, Ser: 1.15 mg/dL (ref 0.40–1.50)
GFR: 68.7 mL/min (ref >60.00)
Glucose, Bld: 93 mg/dL (ref 70–99)
Potassium: 4.5 mEq/L (ref 3.5–5.1)
Sodium: 137 mEq/L (ref 135–145)
Total Bilirubin: 0.6 mg/dL (ref 0.2–1.2)
Total Protein: 6.9 g/dL (ref 6.0–8.3)

## 2018-11-06 LAB — LIPID PANEL
Cholesterol: 169 mg/dL (ref 0–200)
HDL: 35.9 mg/dL — ABNORMAL LOW (ref >39.00)
LDL Cholesterol: 112 mg/dL — ABNORMAL HIGH (ref 0–99)
NonHDL: 133.18
Total CHOL/HDL Ratio: 5
Triglycerides: 107 mg/dL (ref 0.0–149.0)
VLDL: 21.4 mg/dL (ref 0.0–40.0)

## 2018-11-06 LAB — HEMOGLOBIN A1C: Hgb A1c MFr Bld: 5.6 % (ref 4.6–6.5)

## 2018-11-06 LAB — TSH: TSH: 1.43 u[IU]/mL (ref 0.35–4.50)

## 2018-11-06 LAB — VITAMIN B12: Vitamin B-12: 261 pg/mL (ref 211–911)

## 2018-11-06 MED ORDER — VITAMIN D (ERGOCALCIFEROL) 1.25 MG (50000 UNIT) PO CAPS: 50000.0000 [IU] | ORAL_CAPSULE | ORAL | 0 refills | Status: DC

## 2018-11-06 MED ORDER — AMLODIPINE BESYLATE-VALSARTAN 5-320 MG PO TABS: 1.0000 | ORAL_TABLET | Freq: Every day | ORAL | 1 refills | Status: DC

## 2018-11-06 MED ORDER — AMPHETAMINE-DEXTROAMPHET ER 20 MG PO CP24: 20.0000 mg | ORAL_CAPSULE | ORAL | 0 refills | Status: DC

## 2018-11-06 MED ORDER — AMPHETAMINE-DEXTROAMPHET ER 20 MG PO CP24: 20.0000 mg | ORAL_CAPSULE | Freq: Every morning | ORAL | 0 refills | Status: DC

## 2018-11-06 MED FILL — VIT D2 1.25 MG (50,000 UNIT: 1.25 MG | 28 days supply | Qty: 4 | Fill #0

## 2018-11-06 NOTE — Patient Instructions (Signed)
-Nice seeing you today!!  -Lab work today; will notify you once results are available.  -Remember to seek dental care.  -Flu vaccine today.  -Will see you again in 3 months.   Preventive Care 68-45 Years Old, Male Preventive care refers to lifestyle choices and visits with your health care provider that can promote health and wellness. This includes:  A yearly physical exam. This is also called an annual well check.  Regular dental and eye exams.  Immunizations.  Screening for certain conditions.  Healthy lifestyle choices, such as eating a healthy diet, getting regular exercise, not using drugs or products that contain nicotine and tobacco, and limiting alcohol use. What can I expect for my preventive care visit? Physical exam Your health care provider will check:  Height and weight. These may be used to calculate body mass index (BMI), which is a measurement that tells if you are at a healthy weight.  Heart rate and blood pressure.  Your skin for abnormal spots. Counseling Your health care provider may ask you questions about:  Alcohol, tobacco, and drug use.  Emotional well-being.  Home and relationship well-being.  Sexual activity.  Eating habits.  Work and work Statistician. What immunizations do I need?  Influenza (flu) vaccine  This is recommended every year. Tetanus, diphtheria, and pertussis (Tdap) vaccine  You may need a Td booster every 10 years. Varicella (chickenpox) vaccine  You may need this vaccine if you have not already been vaccinated. Zoster (shingles) vaccine  You may need this after age 47. Measles, mumps, and rubella (MMR) vaccine  You may need at least one dose of MMR if you were born in 1957 or later. You may also need a second dose. Pneumococcal conjugate (PCV13) vaccine  You may need this if you have certain conditions and were not previously vaccinated. Pneumococcal polysaccharide (PPSV23) vaccine  You may need one or two  doses if you smoke cigarettes or if you have certain conditions. Meningococcal conjugate (MenACWY) vaccine  You may need this if you have certain conditions. Hepatitis A vaccine  You may need this if you have certain conditions or if you travel or work in places where you may be exposed to hepatitis A. Hepatitis B vaccine  You may need this if you have certain conditions or if you travel or work in places where you may be exposed to hepatitis B. Haemophilus influenzae type b (Hib) vaccine  You may need this if you have certain risk factors. Human papillomavirus (HPV) vaccine  If recommended by your health care provider, you may need three doses over 6 months. You may receive vaccines as individual doses or as more than one vaccine together in one shot (combination vaccines). Talk with your health care provider about the risks and benefits of combination vaccines. What tests do I need? Blood tests  Lipid and cholesterol levels. These may be checked every 5 years, or more frequently if you are over 39 years old.  Hepatitis C test.  Hepatitis B test. Screening  Lung cancer screening. You may have this screening every year starting at age 79 if you have a 30-pack-year history of smoking and currently smoke or have quit within the past 15 years.  Prostate cancer screening. Recommendations will vary depending on your family history and other risks.  Colorectal cancer screening. All adults should have this screening starting at age 28 and continuing until age 63. Your health care provider may recommend screening at age 59 if you are at increased  risk. You will have tests every 1-10 years, depending on your results and the type of screening test.  Diabetes screening. This is done by checking your blood sugar (glucose) after you have not eaten for a while (fasting). You may have this done every 1-3 years.  Sexually transmitted disease (STD) testing. Follow these instructions at home:  Eating and drinking  Eat a diet that includes fresh fruits and vegetables, whole grains, lean protein, and low-fat dairy products.  Take vitamin and mineral supplements as recommended by your health care provider.  Do not drink alcohol if your health care provider tells you not to drink.  If you drink alcohol: ? Limit how much you have to 0-2 drinks a day. ? Be aware of how much alcohol is in your drink. In the [place] one drink equals one 12 oz bottle of beer (355 mL), one 5 oz glass of wine (148 mL), or one 1 oz glass of hard liquor (44 mL). Lifestyle  Take daily care of your teeth and gums.  Stay active. Exercise for at least 30 minutes on 5 or more days each week.  Do not use any products that contain nicotine or tobacco, such as cigarettes, e-cigarettes, and chewing tobacco. If you need help quitting, ask your health care provider.  If you are sexually active, practice safe sex. Use a condom or other form of protection to prevent STIs (sexually transmitted infections).  Talk with your health care provider about taking a low-dose aspirin every day starting at age 10. What's next?  Go to your health care provider once a year for a well check visit.  Ask your health care provider how often you should have your eyes and teeth checked.  Stay up to date on all vaccines. This information is not intended to replace advice given to you by your health care provider. Make sure you discuss any questions you have with your health care provider. Document Released: 01/27/2015 Document Revised: 12/25/2017 Document Reviewed: 12/25/2017 Elsevier Patient Education  2020 [employer].

## 2018-11-06 NOTE — Addendum Note (Signed)
Addended by: Isaiah Serge D on: 11/06/2018 08:51 AM   Modules accepted: Orders

## 2018-11-06 NOTE — Progress Notes (Signed)
Established Patient Office Visit     CC/Reason for Visit: Annual preventive exam  HPI: Adam Terry is a 43 y.o. male who is coming in today for the above mentioned reasons. Past Medical History is significant for: Hypertension that has been well controlled on amlodipine and valsartan.  He also has a history significant for asthma followed [facility] for which she takes Symbicort, Singulair, Xyzal and rescue Xopenex inhaler.  He has been pretty well controlled recently states he uses his inhaler only once to twice a month.  He has a history of ADHD on Adderall.  He has no acute complaints today.  He has routine eye care but not dental care, he is not very physically active.   Past Medical/Surgical History: Past Medical History:  Diagnosis Date  . ADHD (attention deficit hyperactivity disorder)   . ALLERGIC RHINITIS 09/17/2007  . Allergy   . Hypertension   . NEPHROLITHIASIS, HX OF 10/20/2007  . STONE, URINARY CALCULUS,UNSPEC. 09/17/2007    Past Surgical History:  Procedure Laterality Date  . TONSILLECTOMY      Social History:  reports that he has never smoked. He has never used smokeless tobacco. He reports that he does not drink alcohol or use drugs.  Allergies: Allergies  Allergen Reactions  . Penicillins     REACTION: hives, throat closes  . Sulfamethoxazole     REACTION: hives, throat closes    Family History:  Family History  Problem Relation Age of Onset  . Urolithiasis Mother   . Leukemia Maternal Uncle   . Heart disease Maternal Grandmother      Current Outpatient Medications:  .  albuterol (PROVENTIL HFA;VENTOLIN HFA) 108 (90 Base) MCG/ACT inhaler, Inhale 2 puffs into the lungs every 6 (six) hours as needed for wheezing or shortness of breath., Disp: 1 Inhaler, Rfl: 0 .  amLODipine-valsartan (EXFORGE) 5-320 MG tablet, Take 1 tablet by mouth daily., Disp: 90 tablet, Rfl: 1 .  amphetamine-dextroamphetamine (ADDERALL XR) 20 MG 24 hr capsule, Take 1  capsule (20 mg total) by mouth every morning., Disp: 30 capsule, Rfl: 0 .  amphetamine-dextroamphetamine (ADDERALL XR) 20 MG 24 hr capsule, Take 1 capsule (20 mg total) by mouth every morning., Disp: 30 capsule, Rfl: 0 .  amphetamine-dextroamphetamine (ADDERALL XR) 20 MG 24 hr capsule, Take 1 capsule (20 mg total) by mouth every morning., Disp: 30 capsule, Rfl: 0 .  aspirin-acetaminophen-caffeine (EXCEDRIN MIGRAINE) 250-250-65 MG per tablet, Take 1 tablet by mouth every 6 (six) hours as needed for headache., Disp: , Rfl:  .  azelastine (ASTELIN) 0.1 % nasal spray, Place 2 sprays into both nostrils 2 (two) times daily. Use in each nostril as directed, Disp: 30 mL, Rfl: 12 .  budesonide-formoterol (SYMBICORT) 160-4.5 MCG/ACT inhaler, Inhale 2 puffs into the lungs 2 (two) times daily., Disp: 1 Inhaler, Rfl: 3 .  levocetirizine (XYZAL) 5 MG tablet, Take 1 tablet (5 mg total) by mouth daily., Disp: 60 tablet, Rfl: 4 .  montelukast (SINGULAIR) 10 MG tablet, Take 1 tablet (10 mg total) by mouth at bedtime., Disp: 30 tablet, Rfl: 6  Review of Systems:  Constitutional: Denies fever, chills, diaphoresis, appetite change and fatigue.  HEENT: Denies photophobia, eye pain, redness, hearing loss, ear pain, congestion, sore throat, rhinorrhea, sneezing, mouth sores, trouble swallowing, neck pain, neck stiffness and tinnitus.   Respiratory: Denies SOB, DOE, cough, chest tightness,  and wheezing.   Cardiovascular: Denies chest pain, palpitations and leg swelling.  Gastrointestinal: Denies nausea, vomiting,  abdominal pain, diarrhea, constipation, blood in stool and abdominal distention.  Genitourinary: Denies dysuria, urgency, frequency, hematuria, flank pain and difficulty urinating.  Endocrine: Denies: hot or cold intolerance, sweats, changes in hair or nails, polyuria, polydipsia. Musculoskeletal: Denies myalgias, back pain, joint swelling, arthralgias and gait problem.  Skin: Denies pallor, rash and wound.   Neurological: Denies dizziness, seizures, syncope, weakness, light-headedness, numbness and headaches.  Hematological: Denies adenopathy. Easy bruising, personal or family bleeding history  Psychiatric/Behavioral: Denies suicidal ideation, mood changes, confusion, nervousness, sleep disturbance and agitation    Physical Exam: Vitals:   11/06/18 0823  BP: 110/80  Pulse: 84  Temp: 97.8 F (36.6 C)  TempSrc: Temporal  SpO2: 97%  Weight: 208 lb 12.8 oz (94.7 kg)  Height: '5\' 8"'$  (1.727 m)    Body mass index is 31.75 kg/m.   Constitutional: NAD, calm, comfortable Eyes: PERRL, lids and conjunctivae normal, wears corrective lenses ENMT: Mucous membranes are moist. Posterior pharynx clear of any exudate or lesions. Normal dentition. Tympanic membrane is pearly white, no erythema or bulging.   Neck: normal, supple, no masses, no thyromegaly Respiratory: clear to auscultation bilaterally, no wheezing, no crackles. Normal respiratory effort. No accessory muscle use.  Cardiovascular: Regular rate and rhythm, no murmurs / rubs / gallops. No extremity edema. 2+ pedal pulses. No carotid bruits.  Abdomen: no tenderness, no masses palpated. No hepatosplenomegaly. Bowel sounds positive.  Musculoskeletal: no clubbing / cyanosis. No joint deformity upper and lower extremities. Good ROM, no contractures. Normal muscle tone.  Skin: no rashes, lesions, ulcers. No induration Neurologic: CN 2-12 grossly intact. Sensation intact, DTR normal. Strength 5/5 in all 4.  Psychiatric: Normal judgment and insight. Alert and oriented x 3. Normal mood.    Impression and Plan:  Encounter for preventive health examination  -Has routine eye care, have advised routine dental care. -To receive flu vaccine today, otherwise immunizations are up-to-date and age-appropriate. -Screening labs to be done today. -Healthy lifestyle has been discussed in detail. -Commence routine colon cancer screening at age 54.   Attention deficit hyperactivity disorder (ADHD), combined type -Refill Adderall x3 months today. -PDMP has been reviewed, no red flags.  Essential hypertension  -Well-controlled on current medication.  Moderate persistent asthma  -Has been well controlled recently, is being followed at the allergy center.    Patient Instructions  -Nice seeing you today!!  -Lab work today; will notify you once results are available.  -Remember to seek dental care.  -Flu vaccine today.  -Will see you again in 3 months.   Preventive Care 53-68 Years Old, Male Preventive care refers to lifestyle choices and visits with your health care provider that can promote health and wellness. This includes:  A yearly physical exam. This is also called an annual well check.  Regular dental and eye exams.  Immunizations.  Screening for certain conditions.  Healthy lifestyle choices, such as eating a healthy diet, getting regular exercise, not using drugs or products that contain nicotine and tobacco, and limiting alcohol use. What can I expect for my preventive care visit? Physical exam Your health care provider will check:  Height and weight. These may be used to calculate body mass index (BMI), which is a measurement that tells if you are at a healthy weight.  Heart rate and blood pressure.  Your skin for abnormal spots. Counseling Your health care provider may ask you questions about:  Alcohol, tobacco, and drug use.  Emotional well-being.  Home and relationship well-being.  Sexual activity.  Eating  habits.  Work and work Statistician. What immunizations do I need?  Influenza (flu) vaccine  This is recommended every year. Tetanus, diphtheria, and pertussis (Tdap) vaccine  You may need a Td booster every 10 years. Varicella (chickenpox) vaccine  You may need this vaccine if you have not already been vaccinated. Zoster (shingles) vaccine  You may need this after age 79.  Measles, mumps, and rubella (MMR) vaccine  You may need at least one dose of MMR if you were born in 1957 or later. You may also need a second dose. Pneumococcal conjugate (PCV13) vaccine  You may need this if you have certain conditions and were not previously vaccinated. Pneumococcal polysaccharide (PPSV23) vaccine  You may need one or two doses if you smoke cigarettes or if you have certain conditions. Meningococcal conjugate (MenACWY) vaccine  You may need this if you have certain conditions. Hepatitis A vaccine  You may need this if you have certain conditions or if you travel or work in places where you may be exposed to hepatitis A. Hepatitis B vaccine  You may need this if you have certain conditions or if you travel or work in places where you may be exposed to hepatitis B. Haemophilus influenzae type b (Hib) vaccine  You may need this if you have certain risk factors. Human papillomavirus (HPV) vaccine  If recommended by your health care provider, you may need three doses over 6 months. You may receive vaccines as individual doses or as more than one vaccine together in one shot (combination vaccines). Talk with your health care provider about the risks and benefits of combination vaccines. What tests do I need? Blood tests  Lipid and cholesterol levels. These may be checked every 5 years, or more frequently if you are over 52 years old.  Hepatitis C test.  Hepatitis B test. Screening  Lung cancer screening. You may have this screening every year starting at age 25 if you have a 30-pack-year history of smoking and currently smoke or have quit within the past 15 years.  Prostate cancer screening. Recommendations will vary depending on your family history and other risks.  Colorectal cancer screening. All adults should have this screening starting at age 65 and continuing until age 35. Your health care provider may recommend screening at age 24 if you are at increased  risk. You will have tests every 1-10 years, depending on your results and the type of screening test.  Diabetes screening. This is done by checking your blood sugar (glucose) after you have not eaten for a while (fasting). You may have this done every 1-3 years.  Sexually transmitted disease (STD) testing. Follow these instructions at home: Eating and drinking  Eat a diet that includes fresh fruits and vegetables, whole grains, lean protein, and low-fat dairy products.  Take vitamin and mineral supplements as recommended by your health care provider.  Do not drink alcohol if your health care provider tells you not to drink.  If you drink alcohol: ? Limit how much you have to 0-2 drinks a day. ? Be aware of how much alcohol is in your drink. In the [place] one drink equals one 12 oz bottle of beer (355 mL), one 5 oz glass of wine (148 mL), or one 1 oz glass of hard liquor (44 mL). Lifestyle  Take daily care of your teeth and gums.  Stay active. Exercise for at least 30 minutes on 5 or more days each week.  Do not use any products that  contain nicotine or tobacco, such as cigarettes, e-cigarettes, and chewing tobacco. If you need help quitting, ask your health care provider.  If you are sexually active, practice safe sex. Use a condom or other form of protection to prevent STIs (sexually transmitted infections).  Talk with your health care provider about taking a low-dose aspirin every day starting at age 86. What's next?  Go to your health care provider once a year for a well check visit.  Ask your health care provider how often you should have your eyes and teeth checked.  Stay up to date on all vaccines. This information is not intended to replace advice given to you by your health care provider. Make sure you discuss any questions you have with your health care provider. Document Released: 01/27/2015 Document Revised: 12/25/2017 Document Reviewed: 12/25/2017 Elsevier Patient  Education  2020 [place], [place] [facility]

## 2018-11-09 ENCOUNTER — Encounter: Payer: Self-pay | Admitting: Internal Medicine

## 2018-11-09 DIAGNOSIS — E559 Vitamin D deficiency, unspecified: Secondary | ICD-10-CM

## 2018-11-10 MED ORDER — VITAMIN D (ERGOCALCIFEROL) 1.25 MG (50000 UNIT) PO CAPS: 50000.0000 [IU] | ORAL_CAPSULE | ORAL | 0 refills | Status: AC

## 2018-11-12 DIAGNOSIS — J301 Allergic rhinitis due to pollen: Secondary | ICD-10-CM | POA: Diagnosis not present

## 2018-11-12 DIAGNOSIS — J3089 Other allergic rhinitis: Secondary | ICD-10-CM | POA: Diagnosis not present

## 2018-11-12 DIAGNOSIS — J3081 Allergic rhinitis due to animal (cat) (dog) hair and dander: Secondary | ICD-10-CM | POA: Diagnosis not present

## 2018-12-04 DIAGNOSIS — J3081 Allergic rhinitis due to animal (cat) (dog) hair and dander: Secondary | ICD-10-CM | POA: Diagnosis not present

## 2018-12-04 DIAGNOSIS — J301 Allergic rhinitis due to pollen: Secondary | ICD-10-CM | POA: Diagnosis not present

## 2018-12-04 DIAGNOSIS — J3089 Other allergic rhinitis: Secondary | ICD-10-CM | POA: Diagnosis not present

## 2018-12-18 DIAGNOSIS — J301 Allergic rhinitis due to pollen: Secondary | ICD-10-CM | POA: Diagnosis not present

## 2018-12-18 DIAGNOSIS — J3089 Other allergic rhinitis: Secondary | ICD-10-CM | POA: Diagnosis not present

## 2018-12-18 DIAGNOSIS — J3081 Allergic rhinitis due to animal (cat) (dog) hair and dander: Secondary | ICD-10-CM | POA: Diagnosis not present

## 2018-12-31 DIAGNOSIS — J3081 Allergic rhinitis due to animal (cat) (dog) hair and dander: Secondary | ICD-10-CM | POA: Diagnosis not present

## 2018-12-31 DIAGNOSIS — J3089 Other allergic rhinitis: Secondary | ICD-10-CM | POA: Diagnosis not present

## 2018-12-31 DIAGNOSIS — J301 Allergic rhinitis due to pollen: Secondary | ICD-10-CM | POA: Diagnosis not present

## 2019-01-21 DIAGNOSIS — J301 Allergic rhinitis due to pollen: Secondary | ICD-10-CM | POA: Diagnosis not present

## 2019-01-21 DIAGNOSIS — J3081 Allergic rhinitis due to animal (cat) (dog) hair and dander: Secondary | ICD-10-CM | POA: Diagnosis not present

## 2019-01-21 DIAGNOSIS — J3089 Other allergic rhinitis: Secondary | ICD-10-CM | POA: Diagnosis not present

## 2019-01-29 DIAGNOSIS — J3089 Other allergic rhinitis: Secondary | ICD-10-CM | POA: Diagnosis not present

## 2019-01-29 DIAGNOSIS — J301 Allergic rhinitis due to pollen: Secondary | ICD-10-CM | POA: Diagnosis not present

## 2019-01-29 DIAGNOSIS — J3081 Allergic rhinitis due to animal (cat) (dog) hair and dander: Secondary | ICD-10-CM | POA: Diagnosis not present

## 2019-02-19 DIAGNOSIS — J301 Allergic rhinitis due to pollen: Secondary | ICD-10-CM | POA: Diagnosis not present

## 2019-02-19 DIAGNOSIS — H1045 Other chronic allergic conjunctivitis: Secondary | ICD-10-CM | POA: Diagnosis not present

## 2019-02-19 DIAGNOSIS — J3081 Allergic rhinitis due to animal (cat) (dog) hair and dander: Secondary | ICD-10-CM | POA: Diagnosis not present

## 2019-02-19 DIAGNOSIS — J3089 Other allergic rhinitis: Secondary | ICD-10-CM | POA: Diagnosis not present

## 2019-02-19 DIAGNOSIS — J302 Other seasonal allergic rhinitis: Secondary | ICD-10-CM | POA: Diagnosis not present

## 2019-03-05 ENCOUNTER — Ambulatory Visit: Payer: BC Managed Care – PPO

## 2019-03-11 ENCOUNTER — Ambulatory Visit: Payer: BC Managed Care – PPO | Attending: Internal Medicine

## 2019-03-11 DIAGNOSIS — Z20822 Contact with and (suspected) exposure to covid-19: Secondary | ICD-10-CM | POA: Diagnosis not present

## 2019-03-12 LAB — NOVEL CORONAVIRUS, NAA: SARS-CoV-2, NAA: NOT DETECTED

## 2019-03-17 DIAGNOSIS — J3081 Allergic rhinitis due to animal (cat) (dog) hair and dander: Secondary | ICD-10-CM | POA: Diagnosis not present

## 2019-03-17 DIAGNOSIS — J301 Allergic rhinitis due to pollen: Secondary | ICD-10-CM | POA: Diagnosis not present

## 2019-03-17 DIAGNOSIS — J3089 Other allergic rhinitis: Secondary | ICD-10-CM | POA: Diagnosis not present

## 2019-03-21 ENCOUNTER — Ambulatory Visit: Payer: BC Managed Care – PPO | Attending: Internal Medicine

## 2019-03-21 DIAGNOSIS — Z23 Encounter for immunization: Secondary | ICD-10-CM | POA: Insufficient documentation

## 2019-03-21 NOTE — Progress Notes (Signed)
   Covid-19 Vaccination Clinic  Name:  Harith Ehrler    MRN: [id number] DOB: 05/23/1975  03/21/2019  Mr. Berrien was observed post Covid-19 immunization for 30 minutes based on pre-vaccination screening without incident. He was provided with Vaccine Information Sheet and instruction to access the V-Safe system.   Mr. Shriber was instructed to call 911 with any severe reactions post vaccine: Marland Kitchen Difficulty breathing  . Swelling of face and throat  . A fast heartbeat  . A bad rash all over body  . Dizziness and weakness   Immunizations Administered    Name Date Dose VIS Date Route   Pfizer COVID-19 Vaccine 03/21/2019  4:17 PM 0.3 mL 12/25/2018 Intramuscular   Manufacturer: [place]   Lot: [id number]   [place]: [id number]

## 2019-03-30 DIAGNOSIS — J301 Allergic rhinitis due to pollen: Secondary | ICD-10-CM | POA: Diagnosis not present

## 2019-03-30 DIAGNOSIS — J3089 Other allergic rhinitis: Secondary | ICD-10-CM | POA: Diagnosis not present

## 2019-03-30 DIAGNOSIS — J3081 Allergic rhinitis due to animal (cat) (dog) hair and dander: Secondary | ICD-10-CM | POA: Diagnosis not present

## 2019-04-12 DIAGNOSIS — J3089 Other allergic rhinitis: Secondary | ICD-10-CM | POA: Diagnosis not present

## 2019-04-12 DIAGNOSIS — J3081 Allergic rhinitis due to animal (cat) (dog) hair and dander: Secondary | ICD-10-CM | POA: Diagnosis not present

## 2019-04-12 DIAGNOSIS — J301 Allergic rhinitis due to pollen: Secondary | ICD-10-CM | POA: Diagnosis not present

## 2019-04-21 ENCOUNTER — Ambulatory Visit: Payer: BC Managed Care – PPO | Attending: Internal Medicine

## 2019-04-21 DIAGNOSIS — Z23 Encounter for immunization: Secondary | ICD-10-CM

## 2019-04-21 NOTE — Progress Notes (Signed)
   Covid-19 Vaccination Clinic  Name:  Adam Terry    MRN: [id number] DOB: 24-Aug-1975  04/21/2019  Adam Terry was observed post Covid-19 immunization for 15 minutes without incident. He was provided with Vaccine Information Sheet and instruction to access the V-Safe system.   Adam Terry was instructed to call 911 with any severe reactions post vaccine: Marland Kitchen Difficulty breathing  . Swelling of face and throat  . A fast heartbeat  . A bad rash all over body  . Dizziness and weakness   Immunizations Administered    Name Date Dose VIS Date Route   Pfizer COVID-19 Vaccine 04/21/2019  9:00 AM 0.3 mL 12/25/2018 Intramuscular   Manufacturer: [employer]   Lot: [id number]   [place]: [id number]

## 2019-04-30 DIAGNOSIS — J3089 Other allergic rhinitis: Secondary | ICD-10-CM | POA: Diagnosis not present

## 2019-04-30 DIAGNOSIS — J301 Allergic rhinitis due to pollen: Secondary | ICD-10-CM | POA: Diagnosis not present

## 2019-04-30 DIAGNOSIS — J3081 Allergic rhinitis due to animal (cat) (dog) hair and dander: Secondary | ICD-10-CM | POA: Diagnosis not present

## 2019-05-13 DIAGNOSIS — J3081 Allergic rhinitis due to animal (cat) (dog) hair and dander: Secondary | ICD-10-CM | POA: Diagnosis not present

## 2019-05-13 DIAGNOSIS — J3089 Other allergic rhinitis: Secondary | ICD-10-CM | POA: Diagnosis not present

## 2019-05-13 DIAGNOSIS — J301 Allergic rhinitis due to pollen: Secondary | ICD-10-CM | POA: Diagnosis not present

## 2019-05-27 DIAGNOSIS — J301 Allergic rhinitis due to pollen: Secondary | ICD-10-CM | POA: Diagnosis not present

## 2019-05-27 DIAGNOSIS — J3081 Allergic rhinitis due to animal (cat) (dog) hair and dander: Secondary | ICD-10-CM | POA: Diagnosis not present

## 2019-05-27 DIAGNOSIS — J3089 Other allergic rhinitis: Secondary | ICD-10-CM | POA: Diagnosis not present

## 2019-05-28 DIAGNOSIS — J302 Other seasonal allergic rhinitis: Secondary | ICD-10-CM | POA: Diagnosis not present

## 2019-06-03 ENCOUNTER — Telehealth: Payer: Self-pay | Admitting: Internal Medicine

## 2019-06-03 ENCOUNTER — Other Ambulatory Visit: Payer: Self-pay | Admitting: Internal Medicine

## 2019-06-03 DIAGNOSIS — F902 Attention-deficit hyperactivity disorder, combined type: Secondary | ICD-10-CM

## 2019-06-03 NOTE — Telephone Encounter (Signed)
Please deny.  Patient needs an office visit  °

## 2019-06-03 NOTE — Telephone Encounter (Signed)
Please let patient know he needs an OV for a refill on this medication.

## 2019-06-03 NOTE — Telephone Encounter (Signed)
  amphetamine-dextroamphetamine (ADDERALL XR) 20 MG 24 hr capsule   [place], [place] Phone:  [phone]  Fax:  [phone]

## 2019-06-16 ENCOUNTER — Other Ambulatory Visit: Payer: Self-pay

## 2019-06-17 ENCOUNTER — Ambulatory Visit (INDEPENDENT_AMBULATORY_CARE_PROVIDER_SITE_OTHER): Payer: BC Managed Care – PPO | Admitting: Internal Medicine

## 2019-06-17 ENCOUNTER — Encounter: Payer: Self-pay | Admitting: Internal Medicine

## 2019-06-17 VITALS — BP 110/70 | HR 70 | Temp 98.1°F | Wt 201.5 lb

## 2019-06-17 DIAGNOSIS — J301 Allergic rhinitis due to pollen: Secondary | ICD-10-CM | POA: Diagnosis not present

## 2019-06-17 DIAGNOSIS — I10 Essential (primary) hypertension: Secondary | ICD-10-CM | POA: Diagnosis not present

## 2019-06-17 DIAGNOSIS — F902 Attention-deficit hyperactivity disorder, combined type: Secondary | ICD-10-CM

## 2019-06-17 DIAGNOSIS — J3081 Allergic rhinitis due to animal (cat) (dog) hair and dander: Secondary | ICD-10-CM | POA: Diagnosis not present

## 2019-06-17 DIAGNOSIS — J3089 Other allergic rhinitis: Secondary | ICD-10-CM | POA: Diagnosis not present

## 2019-06-17 MED ORDER — AMPHETAMINE-DEXTROAMPHET ER 20 MG PO CP24: 20.0000 mg | ORAL_CAPSULE | ORAL | 0 refills | Status: DC

## 2019-06-17 MED ORDER — AMPHETAMINE-DEXTROAMPHET ER 20 MG PO CP24: 20.0000 mg | ORAL_CAPSULE | Freq: Every morning | ORAL | 0 refills | Status: DC

## 2019-06-17 MED ORDER — AMLODIPINE BESYLATE-VALSARTAN 5-320 MG PO TABS: 1.0000 | ORAL_TABLET | Freq: Every day | ORAL | 1 refills | Status: DC

## 2019-06-17 NOTE — Progress Notes (Signed)
Established Patient Office Visit     This visit occurred during the SARS-CoV-2 public health emergency.  Safety protocols were in place, including screening questions prior to the visit, additional usage of staff PPE, and extensive cleaning of exam room while observing appropriate contact time as indicated for disinfecting solutions.    CC/Reason for Visit: Medication refills  HPI: Adam Terry is a 44 y.o. male who is coming in today for the above mentioned reasons.  He is here today mainly because he needs refills of his Adderall and blood pressure medication.  He takes 20 mg of Adderall every morning.  Usually only takes it 4 days a week which is why he is able to stretch out his prescription.  He is tolerating the medication well, has no complaints.  Blood pressure has been stable.  Since I last saw him, he did receive both of his Covid vaccines.   Past Medical/Surgical History: Past Medical History:  Diagnosis Date  . ADHD (attention deficit hyperactivity disorder)   . ALLERGIC RHINITIS 09/17/2007  . Allergy   . Hypertension   . NEPHROLITHIASIS, HX OF 10/20/2007  . STONE, URINARY CALCULUS,UNSPEC. 09/17/2007    Past Surgical History:  Procedure Laterality Date  . TONSILLECTOMY      Social History:  reports that he has never smoked. He has never used smokeless tobacco. He reports that he does not drink alcohol or use drugs.  Allergies: Allergies  Allergen Reactions  . Penicillins     REACTION: hives, throat closes  . Sulfamethoxazole     REACTION: hives, throat closes    Family History:  Family History  Problem Relation Age of Onset  . Urolithiasis Mother   . Leukemia Maternal Uncle   . Heart disease Maternal Grandmother      Current Outpatient Medications:  .  albuterol (PROVENTIL HFA;VENTOLIN HFA) 108 (90 Base) MCG/ACT inhaler, Inhale 2 puffs into the lungs every 6 (six) hours as needed for wheezing or shortness of breath., Disp: 1 Inhaler, Rfl: 0 .   amLODipine-valsartan (EXFORGE) 5-320 MG tablet, Take 1 tablet by mouth daily., Disp: 90 tablet, Rfl: 1 .  amphetamine-dextroamphetamine (ADDERALL XR) 20 MG 24 hr capsule, Take 1 capsule (20 mg total) by mouth every morning., Disp: 30 capsule, Rfl: 0 .  amphetamine-dextroamphetamine (ADDERALL XR) 20 MG 24 hr capsule, Take 1 capsule (20 mg total) by mouth every morning., Disp: 30 capsule, Rfl: 0 .  amphetamine-dextroamphetamine (ADDERALL XR) 20 MG 24 hr capsule, Take 1 capsule (20 mg total) by mouth every morning., Disp: 30 capsule, Rfl: 0 .  aspirin-acetaminophen-caffeine (EXCEDRIN MIGRAINE) 250-250-65 MG per tablet, Take 1 tablet by mouth every 6 (six) hours as needed for headache., Disp: , Rfl:  .  azelastine (ASTELIN) 0.1 % nasal spray, Place 2 sprays into both nostrils 2 (two) times daily. Use in each nostril as directed, Disp: 30 mL, Rfl: 12 .  budesonide-formoterol (SYMBICORT) 160-4.5 MCG/ACT inhaler, Inhale 2 puffs into the lungs 2 (two) times daily., Disp: 1 Inhaler, Rfl: 3 .  levocetirizine (XYZAL) 5 MG tablet, Take 1 tablet (5 mg total) by mouth daily., Disp: 60 tablet, Rfl: 4 .  montelukast (SINGULAIR) 10 MG tablet, Take 1 tablet (10 mg total) by mouth at bedtime., Disp: 30 tablet, Rfl: 6  Review of Systems:  Constitutional: Denies fever, chills, diaphoresis, appetite change and fatigue.  HEENT: Denies photophobia, eye pain, redness, hearing loss, ear pain, congestion, sore throat, rhinorrhea, sneezing, mouth sores, trouble swallowing, neck pain, neck stiffness  and tinnitus.   Respiratory: Denies SOB, DOE, cough, chest tightness,  and wheezing.   Cardiovascular: Denies chest pain, palpitations and leg swelling.  Gastrointestinal: Denies nausea, vomiting, abdominal pain, diarrhea, constipation, blood in stool and abdominal distention.  Genitourinary: Denies dysuria, urgency, frequency, hematuria, flank pain and difficulty urinating.  Endocrine: Denies: hot or cold intolerance, sweats,  changes in hair or nails, polyuria, polydipsia. Musculoskeletal: Denies myalgias, back pain, joint swelling, arthralgias and gait problem.  Skin: Denies pallor, rash and wound.  Neurological: Denies dizziness, seizures, syncope, weakness, light-headedness, numbness and headaches.  Hematological: Denies adenopathy. Easy bruising, personal or family bleeding history  Psychiatric/Behavioral: Denies suicidal ideation, mood changes, confusion, nervousness, sleep disturbance and agitation    Physical Exam: Vitals:   06/17/19 0951  BP: 110/70  Pulse: 70  Temp: 98.1 F (36.7 C)  TempSrc: Temporal  SpO2: 98%  Weight: 201 lb 8 oz (91.4 kg)    Body mass index is 30.64 kg/m.   Constitutional: NAD, calm, comfortable Eyes: PERRL, lids and conjunctivae normal, wears corrective lenses ENMT: Mucous membranes are moist.  Respiratory: clear to auscultation bilaterally, no wheezing, no crackles. Normal respiratory effort. No accessory muscle use.  Cardiovascular: Regular rate and rhythm, no murmurs / rubs / gallops. No extremity edema.  Neurologic: Grossly intact and nonfocal Psychiatric: Normal judgment and insight. Alert and oriented x 3. Normal mood.    Impression and Plan:  Attention deficit hyperactivity disorder (ADHD), combined type  -PDMP reviewed, no red flags, overdose risk score is 110. -Refill Adderall 20 mg daily for 3 months.  Essential hypertension  -Well-controlled on current regimen. -Refill Exforge    Patient Instructions  -Nice seeing you today!!  -Schedule follow up in 4 months for your physical. Please come in fasting that day.     Lelon Frohlich, MD [facility] at [facility]

## 2019-06-17 NOTE — Patient Instructions (Signed)
-  Nice seeing you today!!  -Schedule follow up in 4 months for your physical. Please come in fasting that day.

## 2019-07-01 DIAGNOSIS — J301 Allergic rhinitis due to pollen: Secondary | ICD-10-CM | POA: Diagnosis not present

## 2019-07-01 DIAGNOSIS — J3081 Allergic rhinitis due to animal (cat) (dog) hair and dander: Secondary | ICD-10-CM | POA: Diagnosis not present

## 2019-07-01 DIAGNOSIS — J3089 Other allergic rhinitis: Secondary | ICD-10-CM | POA: Diagnosis not present

## 2019-07-16 DIAGNOSIS — J3089 Other allergic rhinitis: Secondary | ICD-10-CM | POA: Diagnosis not present

## 2019-07-16 DIAGNOSIS — J3081 Allergic rhinitis due to animal (cat) (dog) hair and dander: Secondary | ICD-10-CM | POA: Diagnosis not present

## 2019-07-16 DIAGNOSIS — J301 Allergic rhinitis due to pollen: Secondary | ICD-10-CM | POA: Diagnosis not present

## 2019-08-10 DIAGNOSIS — J3089 Other allergic rhinitis: Secondary | ICD-10-CM | POA: Diagnosis not present

## 2019-08-10 DIAGNOSIS — J3081 Allergic rhinitis due to animal (cat) (dog) hair and dander: Secondary | ICD-10-CM | POA: Diagnosis not present

## 2019-08-10 DIAGNOSIS — J301 Allergic rhinitis due to pollen: Secondary | ICD-10-CM | POA: Diagnosis not present

## 2019-08-16 DIAGNOSIS — J3081 Allergic rhinitis due to animal (cat) (dog) hair and dander: Secondary | ICD-10-CM | POA: Diagnosis not present

## 2019-08-16 DIAGNOSIS — J3089 Other allergic rhinitis: Secondary | ICD-10-CM | POA: Diagnosis not present

## 2019-08-16 DIAGNOSIS — J301 Allergic rhinitis due to pollen: Secondary | ICD-10-CM | POA: Diagnosis not present

## 2019-08-19 ENCOUNTER — Other Ambulatory Visit: Payer: Self-pay

## 2019-08-19 ENCOUNTER — Ambulatory Visit (INDEPENDENT_AMBULATORY_CARE_PROVIDER_SITE_OTHER): Payer: BC Managed Care – PPO | Admitting: Internal Medicine

## 2019-08-19 ENCOUNTER — Encounter: Payer: Self-pay | Admitting: Internal Medicine

## 2019-08-19 VITALS — BP 130/80 | HR 80 | Temp 97.9°F | Wt 197.7 lb

## 2019-08-19 DIAGNOSIS — J3081 Allergic rhinitis due to animal (cat) (dog) hair and dander: Secondary | ICD-10-CM | POA: Diagnosis not present

## 2019-08-19 DIAGNOSIS — H7291 Unspecified perforation of tympanic membrane, right ear: Secondary | ICD-10-CM

## 2019-08-19 DIAGNOSIS — H9201 Otalgia, right ear: Secondary | ICD-10-CM

## 2019-08-19 DIAGNOSIS — J301 Allergic rhinitis due to pollen: Secondary | ICD-10-CM | POA: Diagnosis not present

## 2019-08-19 DIAGNOSIS — J3089 Other allergic rhinitis: Secondary | ICD-10-CM | POA: Diagnosis not present

## 2019-08-19 NOTE — Progress Notes (Signed)
Acute office Visit     This visit occurred during the SARS-CoV-2 public health emergency.  Safety protocols were in place, including screening questions prior to the visit, additional usage of staff PPE, and extensive cleaning of exam room while observing appropriate contact time as indicated for disinfecting solutions.    CC/Reason for Visit: Right ear pain  HPI: Adam Terry is a 44 y.o. male who is coming in today for the above mentioned reasons.  As a child he had multiple ear infections and ruptured TMs on both sides.  Last week he started having right ear pain, he denies fever or any other URI symptoms.  He went to the beach and had severe pain after going underwater.   Past Medical/Surgical History: Past Medical History:  Diagnosis Date  . ADHD (attention deficit hyperactivity disorder)   . ALLERGIC RHINITIS 09/17/2007  . Allergy   . Hypertension   . NEPHROLITHIASIS, HX OF 10/20/2007  . STONE, URINARY CALCULUS,UNSPEC. 09/17/2007    Past Surgical History:  Procedure Laterality Date  . TONSILLECTOMY      Social History:  reports that he has never smoked. He has never used smokeless tobacco. He reports that he does not drink alcohol and does not use drugs.  Allergies: Allergies  Allergen Reactions  . Penicillins     REACTION: hives, throat closes  . Sulfamethoxazole     REACTION: hives, throat closes    Family History:  Family History  Problem Relation Age of Onset  . Urolithiasis Mother   . Leukemia Maternal Uncle   . Heart disease Maternal Grandmother      Current Outpatient Medications:  .  albuterol (PROVENTIL HFA;VENTOLIN HFA) 108 (90 Base) MCG/ACT inhaler, Inhale 2 puffs into the lungs every 6 (six) hours as needed for wheezing or shortness of breath., Disp: 1 Inhaler, Rfl: 0 .  amLODipine-valsartan (EXFORGE) 5-320 MG tablet, Take 1 tablet by mouth daily., Disp: 90 tablet, Rfl: 1 .  amphetamine-dextroamphetamine (ADDERALL XR) 20 MG 24 hr capsule,  Take 1 capsule (20 mg total) by mouth every morning., Disp: 30 capsule, Rfl: 0 .  amphetamine-dextroamphetamine (ADDERALL XR) 20 MG 24 hr capsule, Take 1 capsule (20 mg total) by mouth every morning., Disp: 30 capsule, Rfl: 0 .  amphetamine-dextroamphetamine (ADDERALL XR) 20 MG 24 hr capsule, Take 1 capsule (20 mg total) by mouth every morning., Disp: 30 capsule, Rfl: 0 .  aspirin-acetaminophen-caffeine (EXCEDRIN MIGRAINE) 250-250-65 MG per tablet, Take 1 tablet by mouth every 6 (six) hours as needed for headache., Disp: , Rfl:  .  azelastine (ASTELIN) 0.1 % nasal spray, Place 2 sprays into both nostrils 2 (two) times daily. Use in each nostril as directed, Disp: 30 mL, Rfl: 12 .  budesonide-formoterol (SYMBICORT) 160-4.5 MCG/ACT inhaler, Inhale 2 puffs into the lungs 2 (two) times daily., Disp: 1 Inhaler, Rfl: 3 .  levocetirizine (XYZAL) 5 MG tablet, Take 1 tablet (5 mg total) by mouth daily., Disp: 60 tablet, Rfl: 4 .  montelukast (SINGULAIR) 10 MG tablet, Take 1 tablet (10 mg total) by mouth at bedtime., Disp: 30 tablet, Rfl: 6  Review of Systems:  Constitutional: Denies fever, chills, diaphoresis, appetite change and fatigue.  HEENT: Denies photophobia, eye pain, redness, hearing loss, ear pain, congestion, sore throat, rhinorrhea, sneezing, mouth sores, trouble swallowing, neck pain, neck stiffness and tinnitus.   Respiratory: Denies SOB, DOE, cough, chest tightness,  and wheezing.   Cardiovascular: Denies chest pain, palpitations and leg swelling.  Gastrointestinal: Denies nausea, vomiting,  abdominal pain, diarrhea, constipation, blood in stool and abdominal distention.  Genitourinary: Denies dysuria, urgency, frequency, hematuria, flank pain and difficulty urinating.  Endocrine: Denies: hot or cold intolerance, sweats, changes in hair or nails, polyuria, polydipsia. Musculoskeletal: Denies myalgias, back pain, joint swelling, arthralgias and gait problem.  Skin: Denies pallor, rash and  wound.  Neurological: Denies dizziness, seizures, syncope, weakness, light-headedness, numbness and headaches.  Hematological: Denies adenopathy. Easy bruising, personal or family bleeding history  Psychiatric/Behavioral: Denies suicidal ideation, mood changes, confusion, nervousness, sleep disturbance and agitation    Physical Exam: Vitals:   08/19/19 1136  BP: 130/80  Pulse: 80  Temp: 97.9 F (36.6 C)  TempSrc: Oral  SpO2: 97%  Weight: 197 lb 11.2 oz (89.7 kg)    Body mass index is 30.06 kg/m.   Constitutional: NAD, calm, comfortable Eyes: PERRL, lids and conjunctivae normal, wears corrective lenses ENMT: Mucous membranes are moist.  Tympanic membrane is pearly white, no erythema or bulging on the left, right has a small rupture of the tympanic membrane, no sign of infection. Neurologic: Grossly intact and nonfocal Psychiatric: Normal judgment and insight. Alert and oriented x 3. Normal mood.    Impression and Plan:  Right ear pain  Perforated tympanic membrane on examination, right  -Send referral to ENT for ruptured TM, do not believe antibiotics are necessary at this time as he does not appear to have otitis.    Lelon Frohlich, MD [facility]

## 2019-08-25 DIAGNOSIS — J302 Other seasonal allergic rhinitis: Secondary | ICD-10-CM | POA: Diagnosis not present

## 2019-08-25 DIAGNOSIS — J301 Allergic rhinitis due to pollen: Secondary | ICD-10-CM | POA: Diagnosis not present

## 2019-08-25 DIAGNOSIS — J3081 Allergic rhinitis due to animal (cat) (dog) hair and dander: Secondary | ICD-10-CM | POA: Diagnosis not present

## 2019-08-26 ENCOUNTER — Encounter: Payer: Self-pay | Admitting: Internal Medicine

## 2019-08-27 DIAGNOSIS — J3089 Other allergic rhinitis: Secondary | ICD-10-CM | POA: Diagnosis not present

## 2019-08-27 DIAGNOSIS — J301 Allergic rhinitis due to pollen: Secondary | ICD-10-CM | POA: Diagnosis not present

## 2019-08-27 DIAGNOSIS — J3081 Allergic rhinitis due to animal (cat) (dog) hair and dander: Secondary | ICD-10-CM | POA: Diagnosis not present

## 2019-09-02 DIAGNOSIS — J3081 Allergic rhinitis due to animal (cat) (dog) hair and dander: Secondary | ICD-10-CM | POA: Diagnosis not present

## 2019-09-02 DIAGNOSIS — J301 Allergic rhinitis due to pollen: Secondary | ICD-10-CM | POA: Diagnosis not present

## 2019-09-02 DIAGNOSIS — J3089 Other allergic rhinitis: Secondary | ICD-10-CM | POA: Diagnosis not present

## 2019-09-09 ENCOUNTER — Ambulatory Visit (INDEPENDENT_AMBULATORY_CARE_PROVIDER_SITE_OTHER): Payer: BC Managed Care – PPO | Admitting: Otolaryngology

## 2019-09-09 ENCOUNTER — Encounter (INDEPENDENT_AMBULATORY_CARE_PROVIDER_SITE_OTHER): Payer: Self-pay | Admitting: Otolaryngology

## 2019-09-09 ENCOUNTER — Other Ambulatory Visit: Payer: Self-pay

## 2019-09-09 VITALS — Temp 97.3°F

## 2019-09-09 DIAGNOSIS — J3081 Allergic rhinitis due to animal (cat) (dog) hair and dander: Secondary | ICD-10-CM | POA: Diagnosis not present

## 2019-09-09 DIAGNOSIS — H1045 Other chronic allergic conjunctivitis: Secondary | ICD-10-CM | POA: Diagnosis not present

## 2019-09-09 DIAGNOSIS — J301 Allergic rhinitis due to pollen: Secondary | ICD-10-CM | POA: Diagnosis not present

## 2019-09-09 DIAGNOSIS — J302 Other seasonal allergic rhinitis: Secondary | ICD-10-CM | POA: Diagnosis not present

## 2019-09-09 DIAGNOSIS — H7291 Unspecified perforation of tympanic membrane, right ear: Secondary | ICD-10-CM

## 2019-09-09 NOTE — Progress Notes (Signed)
HPI: Beckam Abdulaziz is a 44 y.o. male who presents is referred by his PCP for evaluation of ruptured right TM.  Apparently he ruptured his right eardrum in July when he was sneezing.  He had intense pain and some decreased hearing.  This is doing much better presently.  He had numerous sets of tubes when he was young and had a paper patch to fix a hole on the right side.  He is having no hearing problems today..  Past Medical History:  Diagnosis Date  . ADHD (attention deficit hyperactivity disorder)   . ALLERGIC RHINITIS 09/17/2007  . Allergy   . Hypertension   . NEPHROLITHIASIS, HX OF 10/20/2007  . STONE, URINARY CALCULUS,UNSPEC. 09/17/2007   Past Surgical History:  Procedure Laterality Date  . TONSILLECTOMY     Social History   Socioeconomic History  . Marital status: Married    Spouse name: Not on file  . Number of children: Not on file  . Years of education: Not on file  . Highest education level: Not on file  Occupational History  . Not on file  Tobacco Use  . Smoking status: Never Smoker  . Smokeless tobacco: Never Used  Substance and Sexual Activity  . Alcohol use: No  . Drug use: No  . Sexual activity: Not on file  Other Topics Concern  . Not on file  Social History Narrative  . Not on file   Social Determinants of Health   Financial Resource Strain:   . Difficulty of Paying Living Expenses: Not on file  Food Insecurity:   . Worried About Charity fundraiser in the Last Year: Not on file  . Ran Out of Food in the Last Year: Not on file  Transportation Needs:   . Lack of Transportation (Medical): Not on file  . Lack of Transportation (Non-Medical): Not on file  Physical Activity:   . Days of Exercise per Week: Not on file  . Minutes of Exercise per Session: Not on file  Stress:   . Feeling of Stress : Not on file  Social Connections:   . Frequency of Communication with Friends and Family: Not on file  . Frequency of Social Gatherings with Friends and  Family: Not on file  . Attends Religious Services: Not on file  . Active Member of Clubs or Organizations: Not on file  . Attends Archivist Meetings: Not on file  . Marital Status: Not on file   Family History  Problem Relation Age of Onset  . Urolithiasis Mother   . Leukemia Maternal Uncle   . Heart disease Maternal Grandmother    Allergies  Allergen Reactions  . Penicillins     REACTION: hives, throat closes  . Sulfamethoxazole     REACTION: hives, throat closes   Prior to Admission medications   Medication Sig Start Date End Date Taking? Authorizing Provider  albuterol (PROVENTIL HFA;VENTOLIN HFA) 108 (90 Base) MCG/ACT inhaler Inhale 2 puffs into the lungs every 6 (six) hours as needed for wheezing or shortness of breath. 10/25/16  Yes Marletta Lor, MD  amLODipine-valsartan (EXFORGE) 5-320 MG tablet Take 1 tablet by mouth daily. 06/17/19  Yes Erline Hau, MD  amphetamine-dextroamphetamine (ADDERALL XR) 20 MG 24 hr capsule Take 1 capsule (20 mg total) by mouth every morning. 06/17/19  Yes Erline Hau, MD  amphetamine-dextroamphetamine (ADDERALL XR) 20 MG 24 hr capsule Take 1 capsule (20 mg total) by mouth every morning. 06/17/19  Yes  Isaac Bliss, Rayford Halsted, MD  amphetamine-dextroamphetamine (ADDERALL XR) 20 MG 24 hr capsule Take 1 capsule (20 mg total) by mouth every morning. 06/17/19  Yes Isaac Bliss, Rayford Halsted, MD  aspirin-acetaminophen-caffeine (EXCEDRIN MIGRAINE) [phone] MG per tablet Take 1 tablet by mouth every 6 (six) hours as needed for headache.   Yes [provider]  azelastine (ASTELIN) 0.1 % nasal spray Place 2 sprays into both nostrils 2 (two) times daily. Use in each nostril as directed 03/22/15  Yes Kuneff, Renee A, DO  budesonide-formoterol (SYMBICORT) 160-4.5 MCG/ACT inhaler Inhale 2 puffs into the lungs 2 (two) times daily. 10/08/16  Yes Marletta Lor, MD  levocetirizine (XYZAL) 5 MG tablet Take 1 tablet  (5 mg total) by mouth daily. 03/13/17  Yes Marletta Lor, MD  montelukast (SINGULAIR) 10 MG tablet Take 1 tablet (10 mg total) by mouth at bedtime. 03/13/17  Yes Marletta Lor, MD     Positive ROS: Otherwise negative  All other systems have been reviewed and were otherwise negative with the exception of those mentioned in the HPI and as above.  Physical Exam: Constitutional: Alert, well-appearing, no acute distress Ears: External ears without lesions or tenderness. Ear canals are clear bilaterally.  Left TM is clear.  Right TM reveals a healing posterior right TM perforation which is 95% healed.  He has just a small pin point hole remaining with squamous debris closing this over.  Of note he has a monomeric thin membrane covering his posterior inferior TM.  On hearing screening with the [phone] he hears well in both ears and hears symmetric with AC > BC bilaterally. Nasal: External nose without lesion. Clear nasal passages Oral: Lips and gums without lesions. Tongue and palate mucosa without lesions. Posterior oropharynx clear. Neck: No palpable adenopathy or masses Respiratory: Breathing comfortably  Skin: No facial/neck lesions or rash noted.  Procedures  Assessment: Healing right tympanic membrane perforation  Plan: No further therapy is needed for this as I suspect this will totally heal within the next few weeks.  The present residual perforation is very small.   Radene Journey, MD   CC:

## 2019-09-21 DIAGNOSIS — M5412 Radiculopathy, cervical region: Secondary | ICD-10-CM | POA: Diagnosis not present

## 2019-09-24 DIAGNOSIS — J3089 Other allergic rhinitis: Secondary | ICD-10-CM | POA: Diagnosis not present

## 2019-09-24 DIAGNOSIS — J301 Allergic rhinitis due to pollen: Secondary | ICD-10-CM | POA: Diagnosis not present

## 2019-09-24 DIAGNOSIS — J3081 Allergic rhinitis due to animal (cat) (dog) hair and dander: Secondary | ICD-10-CM | POA: Diagnosis not present

## 2019-10-21 DIAGNOSIS — M4802 Spinal stenosis, cervical region: Secondary | ICD-10-CM | POA: Diagnosis not present

## 2019-10-21 DIAGNOSIS — M4712 Other spondylosis with myelopathy, cervical region: Secondary | ICD-10-CM | POA: Diagnosis not present

## 2019-10-21 DIAGNOSIS — M5412 Radiculopathy, cervical region: Secondary | ICD-10-CM | POA: Diagnosis not present

## 2019-10-27 ENCOUNTER — Other Ambulatory Visit: Payer: Self-pay | Admitting: Neurosurgery

## 2019-10-27 ENCOUNTER — Other Ambulatory Visit: Payer: Self-pay

## 2019-10-27 DIAGNOSIS — M4802 Spinal stenosis, cervical region: Secondary | ICD-10-CM

## 2019-10-28 ENCOUNTER — Encounter: Payer: Self-pay | Admitting: Internal Medicine

## 2019-10-28 ENCOUNTER — Ambulatory Visit (INDEPENDENT_AMBULATORY_CARE_PROVIDER_SITE_OTHER): Payer: BC Managed Care – PPO | Admitting: Internal Medicine

## 2019-10-28 VITALS — BP 128/80 | HR 70 | Temp 97.8°F | Ht 67.0 in | Wt 195.2 lb

## 2019-10-28 DIAGNOSIS — Z23 Encounter for immunization: Secondary | ICD-10-CM | POA: Diagnosis not present

## 2019-10-28 DIAGNOSIS — R059 Cough, unspecified: Secondary | ICD-10-CM

## 2019-10-28 DIAGNOSIS — I10 Essential (primary) hypertension: Secondary | ICD-10-CM

## 2019-10-28 DIAGNOSIS — Z1211 Encounter for screening for malignant neoplasm of colon: Secondary | ICD-10-CM

## 2019-10-28 DIAGNOSIS — F902 Attention-deficit hyperactivity disorder, combined type: Secondary | ICD-10-CM

## 2019-10-28 DIAGNOSIS — E669 Obesity, unspecified: Secondary | ICD-10-CM | POA: Diagnosis not present

## 2019-10-28 DIAGNOSIS — E559 Vitamin D deficiency, unspecified: Secondary | ICD-10-CM

## 2019-10-28 DIAGNOSIS — Z0000 Encounter for general adult medical examination without abnormal findings: Secondary | ICD-10-CM

## 2019-10-28 MED ORDER — HYDROCOD POLST-CPM POLST ER 10-8 MG/5ML PO SUER: 5.0000 mL | Freq: Two times a day (BID) | ORAL | 0 refills | Status: DC | PRN

## 2019-10-28 MED ORDER — AMPHETAMINE-DEXTROAMPHET ER 20 MG PO CP24: 20.0000 mg | ORAL_CAPSULE | ORAL | 0 refills | Status: DC

## 2019-10-28 MED ORDER — AMPHETAMINE-DEXTROAMPHET ER 20 MG PO CP24: 20.0000 mg | ORAL_CAPSULE | Freq: Every morning | ORAL | 0 refills | Status: DC

## 2019-10-28 NOTE — Progress Notes (Signed)
Established Patient Office Visit     This visit occurred during the SARS-CoV-2 public health emergency.  Safety protocols were in place, including screening questions prior to the visit, additional usage of staff PPE, and extensive cleaning of exam room while observing appropriate contact time as indicated for disinfecting solutions.    CC/Reason for Visit: Annual preventive exam, medication refills  HPI: Adam Terry is a 44 y.o. male who is coming in today for the above mentioned reasons. Past Medical History is significant for: Hypertension that has been well controlled on amlodipine and valsartan, history of asthma followed by the allergy center for which he takes Symbicort, Singulair, Xyzal and as needed Xopenex inhaler.  He has a history of ADHD on Adderall 20 mg daily for which he needs refills today, he has been on same dose for years.  He was recently seen by ENT for right tympanic membrane perforation that was allowed to heal spontaneously.  He has routine eye and dental care, is requesting flu vaccine.  Is due for his first colonoscopy.   Past Medical/Surgical History: Past Medical History:  Diagnosis Date  . ADHD (attention deficit hyperactivity disorder)   . ALLERGIC RHINITIS 09/17/2007  . Allergy   . Hypertension   . NEPHROLITHIASIS, HX OF 10/20/2007  . STONE, URINARY CALCULUS,UNSPEC. 09/17/2007    Past Surgical History:  Procedure Laterality Date  . TONSILLECTOMY      Social History:  reports that he has never smoked. He has never used smokeless tobacco. He reports that he does not drink alcohol and does not use drugs.  Allergies: Allergies  Allergen Reactions  . Penicillins     REACTION: hives, throat closes  . Sulfamethoxazole     REACTION: hives, throat closes    Family History:  Family History  Problem Relation Age of Onset  . Urolithiasis Mother   . Leukemia Maternal Uncle   . Heart disease Maternal Grandmother      Current Outpatient  Medications:  .  albuterol (PROVENTIL HFA;VENTOLIN HFA) 108 (90 Base) MCG/ACT inhaler, Inhale 2 puffs into the lungs every 6 (six) hours as needed for wheezing or shortness of breath., Disp: 1 Inhaler, Rfl: 0 .  amLODipine-valsartan (EXFORGE) 5-320 MG tablet, Take 1 tablet by mouth daily., Disp: 90 tablet, Rfl: 1 .  amphetamine-dextroamphetamine (ADDERALL XR) 20 MG 24 hr capsule, Take 1 capsule (20 mg total) by mouth every morning., Disp: 30 capsule, Rfl: 0 .  amphetamine-dextroamphetamine (ADDERALL XR) 20 MG 24 hr capsule, Take 1 capsule (20 mg total) by mouth every morning., Disp: 30 capsule, Rfl: 0 .  amphetamine-dextroamphetamine (ADDERALL XR) 20 MG 24 hr capsule, Take 1 capsule (20 mg total) by mouth every morning., Disp: 30 capsule, Rfl: 0 .  aspirin-acetaminophen-caffeine (EXCEDRIN MIGRAINE) 250-250-65 MG per tablet, Take 1 tablet by mouth every 6 (six) hours as needed for headache., Disp: , Rfl:  .  azelastine (ASTELIN) 0.1 % nasal spray, Place 2 sprays into both nostrils 2 (two) times daily. Use in each nostril as directed, Disp: 30 mL, Rfl: 12 .  budesonide-formoterol (SYMBICORT) 160-4.5 MCG/ACT inhaler, Inhale 2 puffs into the lungs 2 (two) times daily., Disp: 1 Inhaler, Rfl: 3 .  levocetirizine (XYZAL) 5 MG tablet, Take 1 tablet (5 mg total) by mouth daily., Disp: 60 tablet, Rfl: 4 .  montelukast (SINGULAIR) 10 MG tablet, Take 1 tablet (10 mg total) by mouth at bedtime., Disp: 30 tablet, Rfl: 6 .  chlorpheniramine-HYDROcodone (TUSSIONEX PENNKINETIC ER) 10-8 MG/5ML SUER, Take  5 mLs by mouth every 12 (twelve) hours as needed for cough., Disp: 140 mL, Rfl: 0  Review of Systems:  Constitutional: Denies fever, chills, diaphoresis, appetite change and fatigue.  HEENT: Denies photophobia, eye pain, redness, hearing loss, ear pain, congestion, sore throat, rhinorrhea, sneezing, mouth sores, trouble swallowing, neck pain, neck stiffness and tinnitus.   Respiratory: Denies SOB, DOE, cough, chest  tightness,  and wheezing.   Cardiovascular: Denies chest pain, palpitations and leg swelling.  Gastrointestinal: Denies nausea, vomiting, abdominal pain, diarrhea, constipation, blood in stool and abdominal distention.  Genitourinary: Denies dysuria, urgency, frequency, hematuria, flank pain and difficulty urinating.  Endocrine: Denies: hot or cold intolerance, sweats, changes in hair or nails, polyuria, polydipsia. Musculoskeletal: Denies myalgias, back pain, joint swelling, arthralgias and gait problem.  Skin: Denies pallor, rash and wound.  Neurological: Denies dizziness, seizures, syncope, weakness, light-headedness, numbness and headaches.  Hematological: Denies adenopathy. Easy bruising, personal or family bleeding history  Psychiatric/Behavioral: Denies suicidal ideation, mood changes, confusion, nervousness, sleep disturbance and agitation    Physical Exam: Vitals:   10/28/19 0704  BP: 128/80  Pulse: 70  Temp: 97.8 F (36.6 C)  TempSrc: Oral  SpO2: 99%  Weight: 195 lb 3.2 oz (88.5 kg)  Height: 5\' 7"  (1.702 m)    Body mass index is 30.57 kg/m.   Constitutional: NAD, calm, comfortable Eyes: PERRL, lids and conjunctivae normal, wears corrective lenses ENMT: Mucous membranes are moist. Posterior pharynx clear of any exudate or lesions. Normal dentition. Tympanic membrane is pearly white, no erythema or bulging. Neck: normal, supple, no masses, no thyromegaly Respiratory: clear to auscultation bilaterally, no wheezing, no crackles. Normal respiratory effort. No accessory muscle use.  Cardiovascular: Regular rate and rhythm, no murmurs / rubs / gallops. No extremity edema. 2+ pedal pulses. No carotid bruits.  Abdomen: no tenderness, no masses palpated. No hepatosplenomegaly. Bowel sounds positive.  Musculoskeletal: no clubbing / cyanosis. No joint deformity upper and lower extremities. Good ROM, no contractures. Normal muscle tone.  Skin: no rashes, lesions, ulcers. No  induration Neurologic: CN 2-12 grossly intact. Sensation intact, DTR normal. Strength 5/5 in all 4.  Psychiatric: Normal judgment and insight. Alert and oriented x 3. Normal mood.    Impression and Plan:  Encounter for preventive health examination  -He has routine eye and dental care. -Flu vaccine today, otherwise immunizations are up-to-date. -Screening labs today. -Healthy lifestyle discussed in detail. - PSA today for prostate cancer screening. -Refer to GI for first colonoscopy.  Flu vaccine need  - Plan: Flu Vaccine QUAD 6+ mos PF IM (Fluarix Quad PF)  Vitamin D deficiency  - Plan: VITAMIN D 25 Hydroxy (Vit-D Deficiency, Fractures)  Obesity (BMI 30.0-34.9) -Discussed healthy lifestyle, including increased physical activity and better food choices to promote weight loss. -He continues to lose weight through healthy lifestyle.  Essential hypertension -Blood pressures well controlled on current doses of amlodipine and valsartan.  Attention deficit hyperactivity disorder (ADHD), combined type -PDMP reviewed, no red flags. -Refill Adderall 20 mg daily, for 30 tablets a month x3 months.   Patient Instructions  -Nice seeing you today!!  -Lab work today; will notify you once results are available.  -Flu vaccine today.  -Schedule follow up in 3 months for medication refills and in 1 year for your physical.   Preventive Care 78-62 Years Old, Male Preventive care refers to lifestyle choices and visits with your health care provider that can promote health and wellness. This includes:  A yearly physical exam. This is also  called an annual well check.  Regular dental and eye exams.  Immunizations.  Screening for certain conditions.  Healthy lifestyle choices, such as eating a healthy diet, getting regular exercise, not using drugs or products that contain nicotine and tobacco, and limiting alcohol use. What can I expect for my preventive care visit? Physical  exam Your health care provider will check:  Height and weight. These may be used to calculate body mass index (BMI), which is a measurement that tells if you are at a healthy weight.  Heart rate and blood pressure.  Your skin for abnormal spots. Counseling Your health care provider may ask you questions about:  Alcohol, tobacco, and drug use.  Emotional well-being.  Home and relationship well-being.  Sexual activity.  Eating habits.  Work and work Astronomer. What immunizations do I need?  Influenza (flu) vaccine  This is recommended every year. Tetanus, diphtheria, and pertussis (Tdap) vaccine  You may need a Td booster every 10 years. Varicella (chickenpox) vaccine  You may need this vaccine if you have not already been vaccinated. Zoster (shingles) vaccine  You may need this after age 70. Measles, mumps, and rubella (MMR) vaccine  You may need at least one dose of MMR if you were born in 1957 or later. You may also need a second dose. Pneumococcal conjugate (PCV13) vaccine  You may need this if you have certain conditions and were not previously vaccinated. Pneumococcal polysaccharide (PPSV23) vaccine  You may need one or two doses if you smoke cigarettes or if you have certain conditions. Meningococcal conjugate (MenACWY) vaccine  You may need this if you have certain conditions. Hepatitis A vaccine  You may need this if you have certain conditions or if you travel or work in places where you may be exposed to hepatitis A. Hepatitis B vaccine  You may need this if you have certain conditions or if you travel or work in places where you may be exposed to hepatitis B. Haemophilus influenzae type b (Hib) vaccine  You may need this if you have certain risk factors. Human papillomavirus (HPV) vaccine  If recommended by your health care provider, you may need three doses over 6 months. You may receive vaccines as individual doses or as more than one vaccine  together in one shot (combination vaccines). Talk with your health care provider about the risks and benefits of combination vaccines. What tests do I need? Blood tests  Lipid and cholesterol levels. These may be checked every 5 years, or more frequently if you are over 40 years old.  Hepatitis C test.  Hepatitis B test. Screening  Lung cancer screening. You may have this screening every year starting at age 47 if you have a 30-pack-year history of smoking and currently smoke or have quit within the past 15 years.  Prostate cancer screening. Recommendations will vary depending on your family history and other risks.  Colorectal cancer screening. All adults should have this screening starting at age 46 and continuing until age 18. Your health care provider may recommend screening at age 110 if you are at increased risk. You will have tests every 1-10 years, depending on your results and the type of screening test.  Diabetes screening. This is done by checking your blood sugar (glucose) after you have not eaten for a while (fasting). You may have this done every 1-3 years.  Sexually transmitted disease (STD) testing. Follow these instructions at home: Eating and drinking  Eat a diet that includes fresh fruits  and vegetables, whole grains, lean protein, and low-fat dairy products.  Take vitamin and mineral supplements as recommended by your health care provider.  Do not drink alcohol if your health care provider tells you not to drink.  If you drink alcohol: ? Limit how much you have to 0-2 drinks a day. ? Be aware of how much alcohol is in your drink. In the [place] one drink equals one 12 oz bottle of beer (355 mL), one 5 oz glass of wine (148 mL), or one 1 oz glass of hard liquor (44 mL). Lifestyle  Take daily care of your teeth and gums.  Stay active. Exercise for at least 30 minutes on 5 or more days each week.  Do not use any products that contain nicotine or tobacco, such as  cigarettes, e-cigarettes, and chewing tobacco. If you need help quitting, ask your health care provider.  If you are sexually active, practice safe sex. Use a condom or other form of protection to prevent STIs (sexually transmitted infections).  Talk with your health care provider about taking a low-dose aspirin every day starting at age 38. What's next?  Go to your health care provider once a year for a well check visit.  Ask your health care provider how often you should have your eyes and teeth checked.  Stay up to date on all vaccines. This information is not intended to replace advice given to you by your health care provider. Make sure you discuss any questions you have with your health care provider. Document Revised: 12/25/2017 Document Reviewed: 12/25/2017 Elsevier Patient Education  2020 [place], MD [facility]

## 2019-10-28 NOTE — Patient Instructions (Signed)
-Nice seeing you today!!  -Lab work today; will notify you once results are available.  -Flu vaccine today.  -Schedule follow up in 3 months for medication refills and in 1 year for your physical.   Preventive Care 71-46 Years Old, Male Preventive care refers to lifestyle choices and visits with your health care provider that can promote health and wellness. This includes:  A yearly physical exam. This is also called an annual well check.  Regular dental and eye exams.  Immunizations.  Screening for certain conditions.  Healthy lifestyle choices, such as eating a healthy diet, getting regular exercise, not using drugs or products that contain nicotine and tobacco, and limiting alcohol use. What can I expect for my preventive care visit? Physical exam Your health care provider will check:  Height and weight. These may be used to calculate body mass index (BMI), which is a measurement that tells if you are at a healthy weight.  Heart rate and blood pressure.  Your skin for abnormal spots. Counseling Your health care provider may ask you questions about:  Alcohol, tobacco, and drug use.  Emotional well-being.  Home and relationship well-being.  Sexual activity.  Eating habits.  Work and work Statistician. What immunizations do I need?  Influenza (flu) vaccine  This is recommended every year. Tetanus, diphtheria, and pertussis (Tdap) vaccine  You may need a Td booster every 10 years. Varicella (chickenpox) vaccine  You may need this vaccine if you have not already been vaccinated. Zoster (shingles) vaccine  You may need this after age 22. Measles, mumps, and rubella (MMR) vaccine  You may need at least one dose of MMR if you were born in 1957 or later. You may also need a second dose. Pneumococcal conjugate (PCV13) vaccine  You may need this if you have certain conditions and were not previously vaccinated. Pneumococcal polysaccharide (PPSV23) vaccine  You  may need one or two doses if you smoke cigarettes or if you have certain conditions. Meningococcal conjugate (MenACWY) vaccine  You may need this if you have certain conditions. Hepatitis A vaccine  You may need this if you have certain conditions or if you travel or work in places where you may be exposed to hepatitis A. Hepatitis B vaccine  You may need this if you have certain conditions or if you travel or work in places where you may be exposed to hepatitis B. Haemophilus influenzae type b (Hib) vaccine  You may need this if you have certain risk factors. Human papillomavirus (HPV) vaccine  If recommended by your health care provider, you may need three doses over 6 months. You may receive vaccines as individual doses or as more than one vaccine together in one shot (combination vaccines). Talk with your health care provider about the risks and benefits of combination vaccines. What tests do I need? Blood tests  Lipid and cholesterol levels. These may be checked every 5 years, or more frequently if you are over 50 years old.  Hepatitis C test.  Hepatitis B test. Screening  Lung cancer screening. You may have this screening every year starting at age 26 if you have a 30-pack-year history of smoking and currently smoke or have quit within the past 15 years.  Prostate cancer screening. Recommendations will vary depending on your family history and other risks.  Colorectal cancer screening. All adults should have this screening starting at age 62 and continuing until age 68. Your health care provider may recommend screening at age 73 if you  are at increased risk. You will have tests every 1-10 years, depending on your results and the type of screening test.  Diabetes screening. This is done by checking your blood sugar (glucose) after you have not eaten for a while (fasting). You may have this done every 1-3 years.  Sexually transmitted disease (STD) testing. Follow these  instructions at home: Eating and drinking  Eat a diet that includes fresh fruits and vegetables, whole grains, lean protein, and low-fat dairy products.  Take vitamin and mineral supplements as recommended by your health care provider.  Do not drink alcohol if your health care provider tells you not to drink.  If you drink alcohol: ? Limit how much you have to 0-2 drinks a day. ? Be aware of how much alcohol is in your drink. In the [place] one drink equals one 12 oz bottle of beer (355 mL), one 5 oz glass of wine (148 mL), or one 1 oz glass of hard liquor (44 mL). Lifestyle  Take daily care of your teeth and gums.  Stay active. Exercise for at least 30 minutes on 5 or more days each week.  Do not use any products that contain nicotine or tobacco, such as cigarettes, e-cigarettes, and chewing tobacco. If you need help quitting, ask your health care provider.  If you are sexually active, practice safe sex. Use a condom or other form of protection to prevent STIs (sexually transmitted infections).  Talk with your health care provider about taking a low-dose aspirin every day starting at age 69. What's next?  Go to your health care provider once a year for a well check visit.  Ask your health care provider how often you should have your eyes and teeth checked.  Stay up to date on all vaccines. This information is not intended to replace advice given to you by your health care provider. Make sure you discuss any questions you have with your health care provider. Document Revised: 12/25/2017 Document Reviewed: 12/25/2017 Elsevier Patient Education  2020 [employer].

## 2019-10-28 NOTE — Addendum Note (Signed)
Addended by: Marrion Coy on: 10/28/2019 07:33 AM   Modules accepted: Orders

## 2019-10-28 NOTE — Addendum Note (Signed)
Addended by: Alverda Skeans on: 10/28/2019 08:01 AM   Modules accepted: Orders

## 2019-10-29 LAB — COMPREHENSIVE METABOLIC PANEL
AG Ratio: 1.8 (calc) (ref 1.0–2.5)
ALT: 17 U/L (ref 9–46)
AST: 18 U/L (ref 10–40)
Albumin: 4.6 g/dL (ref 3.6–5.1)
Alkaline phosphatase (APISO): 58 U/L (ref 36–130)
BUN: 13 mg/dL (ref 7–25)
CO2: 27 mmol/L (ref 20–32)
Calcium: 9.7 mg/dL (ref 8.6–10.3)
Chloride: 102 mmol/L (ref 98–110)
Creat: 1.17 mg/dL (ref 0.60–1.35)
Globulin: 2.6 g/dL (calc) (ref 1.9–3.7)
Glucose, Bld: 85 mg/dL (ref 65–99)
Potassium: 4.8 mmol/L (ref 3.5–5.3)
Sodium: 140 mmol/L (ref 135–146)
Total Bilirubin: 0.7 mg/dL (ref 0.2–1.2)
Total Protein: 7.2 g/dL (ref 6.1–8.1)

## 2019-10-29 LAB — LIPID PANEL
Cholesterol: 166 mg/dL (ref <200)
HDL: 41 mg/dL (ref >=40)
LDL Cholesterol (Calc): 106 mg/dL (calc) — ABNORMAL HIGH
Non-HDL Cholesterol (Calc): 125 mg/dL (calc) (ref <130)
Total CHOL/HDL Ratio: 4 (calc) (ref <5.0)
Triglycerides: 99 mg/dL (ref <150)

## 2019-10-29 LAB — HEMOGLOBIN A1C
Hgb A1c MFr Bld: 5.3 % of total Hgb (ref <5.7)
Mean Plasma Glucose: 105 (calc)
eAG (mmol/L): 5.8 (calc)

## 2019-10-29 LAB — CBC WITH DIFFERENTIAL/PLATELET
Absolute Monocytes: 490 cells/uL (ref 200–950)
Basophils Absolute: 31 cells/uL (ref 0–200)
Basophils Relative: 0.6 %
Eosinophils Absolute: 82 cells/uL (ref 15–500)
Eosinophils Relative: 1.6 %
HCT: 43.2 % (ref 38.5–50.0)
Hemoglobin: 14.9 g/dL (ref 13.2–17.1)
Lymphs Abs: 1112 cells/uL (ref 850–3900)
MCH: 31.3 pg (ref 27.0–33.0)
MCHC: 34.5 g/dL (ref 32.0–36.0)
MCV: 90.8 fL (ref 80.0–100.0)
MPV: 11.5 fL (ref 7.5–12.5)
Monocytes Relative: 9.6 %
Neutro Abs: 3386 cells/uL (ref 1500–7800)
Neutrophils Relative %: 66.4 %
Platelets: 227 10*3/uL (ref 140–400)
RBC: 4.76 10*6/uL (ref 4.20–5.80)
RDW: 13.3 % (ref 11.0–15.0)
Total Lymphocyte: 21.8 %
WBC: 5.1 10*3/uL (ref 3.8–10.8)

## 2019-10-29 LAB — VITAMIN B12: Vitamin B-12: 358 pg/mL (ref 200–1100)

## 2019-10-29 LAB — TSH: TSH: 1.32 mIU/L (ref 0.40–4.50)

## 2019-10-29 LAB — VITAMIN D 25 HYDROXY (VIT D DEFICIENCY, FRACTURES): Vit D, 25-Hydroxy: 26 ng/mL — ABNORMAL LOW (ref 30–100)

## 2019-10-29 LAB — PSA: PSA: 0.84 ng/mL (ref <=4.0)

## 2019-11-02 ENCOUNTER — Other Ambulatory Visit: Payer: Self-pay | Admitting: Internal Medicine

## 2019-11-02 DIAGNOSIS — H524 Presbyopia: Secondary | ICD-10-CM | POA: Diagnosis not present

## 2019-11-02 DIAGNOSIS — H5213 Myopia, bilateral: Secondary | ICD-10-CM | POA: Diagnosis not present

## 2019-11-02 DIAGNOSIS — E559 Vitamin D deficiency, unspecified: Secondary | ICD-10-CM

## 2019-11-02 DIAGNOSIS — H52223 Regular astigmatism, bilateral: Secondary | ICD-10-CM | POA: Diagnosis not present

## 2019-11-02 MED ORDER — VITAMIN D (ERGOCALCIFEROL) 1.25 MG (50000 UNIT) PO CAPS: 50000.0000 [IU] | ORAL_CAPSULE | ORAL | 0 refills | Status: AC

## 2019-11-04 DIAGNOSIS — J301 Allergic rhinitis due to pollen: Secondary | ICD-10-CM | POA: Diagnosis not present

## 2019-11-04 DIAGNOSIS — J3089 Other allergic rhinitis: Secondary | ICD-10-CM | POA: Diagnosis not present

## 2019-11-04 DIAGNOSIS — J3081 Allergic rhinitis due to animal (cat) (dog) hair and dander: Secondary | ICD-10-CM | POA: Diagnosis not present

## 2019-11-18 ENCOUNTER — Ambulatory Visit
Admission: RE | Admit: 2019-11-18 | Discharge: 2019-11-18 | Disposition: A | Discharge status: Home / Self Care | Payer: BC Managed Care – PPO | Source: Ambulatory Visit | Attending: Neurosurgery | Admitting: Neurosurgery

## 2019-11-18 DIAGNOSIS — M4802 Spinal stenosis, cervical region: Secondary | ICD-10-CM

## 2019-12-02 ENCOUNTER — Encounter: Payer: Self-pay | Admitting: Gastroenterology

## 2019-12-03 DIAGNOSIS — J3081 Allergic rhinitis due to animal (cat) (dog) hair and dander: Secondary | ICD-10-CM | POA: Diagnosis not present

## 2019-12-03 DIAGNOSIS — J301 Allergic rhinitis due to pollen: Secondary | ICD-10-CM | POA: Diagnosis not present

## 2019-12-03 DIAGNOSIS — J3089 Other allergic rhinitis: Secondary | ICD-10-CM | POA: Diagnosis not present

## 2019-12-21 DIAGNOSIS — J3081 Allergic rhinitis due to animal (cat) (dog) hair and dander: Secondary | ICD-10-CM | POA: Diagnosis not present

## 2019-12-21 DIAGNOSIS — J3089 Other allergic rhinitis: Secondary | ICD-10-CM | POA: Diagnosis not present

## 2019-12-21 DIAGNOSIS — J301 Allergic rhinitis due to pollen: Secondary | ICD-10-CM | POA: Diagnosis not present

## 2020-01-06 ENCOUNTER — Encounter: Payer: Self-pay | Admitting: *Deleted

## 2020-01-13 DIAGNOSIS — J301 Allergic rhinitis due to pollen: Secondary | ICD-10-CM | POA: Diagnosis not present

## 2020-01-13 DIAGNOSIS — J3081 Allergic rhinitis due to animal (cat) (dog) hair and dander: Secondary | ICD-10-CM | POA: Diagnosis not present

## 2020-01-13 DIAGNOSIS — J3089 Other allergic rhinitis: Secondary | ICD-10-CM | POA: Diagnosis not present

## 2020-01-21 ENCOUNTER — Other Ambulatory Visit: Payer: Self-pay

## 2020-01-21 ENCOUNTER — Ambulatory Visit (AMBULATORY_SURGERY_CENTER): Payer: Self-pay | Admitting: *Deleted

## 2020-01-21 VITALS — Ht 67.0 in | Wt 198.0 lb

## 2020-01-21 DIAGNOSIS — Z1211 Encounter for screening for malignant neoplasm of colon: Secondary | ICD-10-CM

## 2020-01-21 MED ORDER — PLENVU 140 G PO SOLR: 1.0000 | ORAL | 0 refills | Status: DC

## 2020-01-21 NOTE — Progress Notes (Signed)
No egg or soy allergy known to patient  No issues with past sedation with any surgeries or procedures No intubation problems in the past  No FH of Malignant Hyperthermia No diet pills per patient No home 02 use per patient  No blood thinners per patient  Pt denies issues with constipation  No A fib or A flutter  EMMI video to pt or via [place] 19 guidelines implemented in PV today with Pt and RN  Pt is fully vaccinated  for Covid   Plenvu  Coupon given to pt in PV today , Code to Pharmacy   Due to the COVID-19 pandemic we are asking patients to follow certain guidelines.  Pt aware of COVID protocols and [facility] guidelines   Pt given the option in PV today for Golytely prep verses  alternative prep  ( Suprep/Plenvu)-  Pt is aware the Golytely has more volume but is more cost effective and the Suprep/Plenvu is less volume but may cost $50-150.  Pt voiced understanding of this and choose Plenvy  Prep.

## 2020-01-28 ENCOUNTER — Other Ambulatory Visit: Payer: BC Managed Care – PPO

## 2020-01-28 DIAGNOSIS — Z20822 Contact with and (suspected) exposure to covid-19: Secondary | ICD-10-CM

## 2020-01-30 LAB — SARS-COV-2, NAA 2 DAY TAT

## 2020-01-30 LAB — NOVEL CORONAVIRUS, NAA: SARS-CoV-2, NAA: NOT DETECTED

## 2020-01-31 ENCOUNTER — Encounter: Payer: Self-pay | Admitting: Gastroenterology

## 2020-02-01 DIAGNOSIS — J3089 Other allergic rhinitis: Secondary | ICD-10-CM | POA: Diagnosis not present

## 2020-02-01 DIAGNOSIS — J301 Allergic rhinitis due to pollen: Secondary | ICD-10-CM | POA: Diagnosis not present

## 2020-02-01 DIAGNOSIS — J3081 Allergic rhinitis due to animal (cat) (dog) hair and dander: Secondary | ICD-10-CM | POA: Diagnosis not present

## 2020-02-03 ENCOUNTER — Other Ambulatory Visit: Payer: Self-pay

## 2020-02-03 ENCOUNTER — Ambulatory Visit (AMBULATORY_SURGERY_CENTER): Payer: BC Managed Care – PPO | Admitting: Gastroenterology

## 2020-02-03 ENCOUNTER — Encounter: Payer: Self-pay | Admitting: Gastroenterology

## 2020-02-03 ENCOUNTER — Other Ambulatory Visit: Payer: Self-pay | Admitting: Gastroenterology

## 2020-02-03 VITALS — BP 106/70 | HR 68 | Temp 96.8°F | Resp 20 | Ht 67.0 in | Wt 198.0 lb

## 2020-02-03 DIAGNOSIS — D124 Benign neoplasm of descending colon: Secondary | ICD-10-CM

## 2020-02-03 DIAGNOSIS — D128 Benign neoplasm of rectum: Secondary | ICD-10-CM | POA: Diagnosis not present

## 2020-02-03 DIAGNOSIS — Z1211 Encounter for screening for malignant neoplasm of colon: Secondary | ICD-10-CM

## 2020-02-03 MED ORDER — SODIUM CHLORIDE 0.9 % IV SOLN: 500.0000 mL | Freq: Once | INTRAVENOUS | Status: DC

## 2020-02-03 NOTE — Op Note (Signed)
[facility] Patient Name: Jyden Kromer Procedure Date: 02/03/2020 1:50 PM MRN: [id number] Endoscopist: Meryl Dare , MD Age: 45 Referring MD:  Date of Birth: 01-16-1975 Gender: Male Account #: [id number] Procedure:                Colonoscopy Indications:              Screening for colorectal malignant neoplasm Medicines:                Monitored Anesthesia Care Procedure:                Pre-Anesthesia Assessment:                           - Prior to the procedure, a History and Physical                            was performed, and patient medications and                            allergies were reviewed. The patient's tolerance of                            previous anesthesia was also reviewed. The risks                            and benefits of the procedure and the sedation                            options and risks were discussed with the patient.                            All questions were answered, and informed consent                            was obtained. Prior Anticoagulants: The patient has                            taken no previous anticoagulant or antiplatelet                            agents. ASA Grade Assessment: II - A patient with                            mild systemic disease. After reviewing the risks                            and benefits, the patient was deemed in                            satisfactory condition to undergo the procedure.                           After obtaining informed consent, the colonoscope  was passed under direct vision. Throughout the                            procedure, the patient's blood pressure, pulse, and                            oxygen saturations were monitored continuously. The                            Olympus CF-HQ190L ([id number]) Colonoscope was                            introduced through the anus and advanced to the the                            cecum, identified by  appendiceal orifice and                            ileocecal valve. The ileocecal valve, appendiceal                            orifice, and rectum were photographed. The quality                            of the bowel preparation was good. The colonoscopy                            was performed without difficulty. The patient                            tolerated the procedure well. Scope In: 1:57:16 PM Scope Out: 2:19:42 PM Scope Withdrawal Time: 0 hours 19 minutes 7 seconds  Total Procedure Duration: 0 hours 22 minutes 26 seconds  Findings:                 The perianal and digital rectal examinations were                            normal.                           Two sessile polyps were found in the rectum and                            descending colon. The polyps were 6 to 7 mm in                            size. These polyps were removed with a cold snare.                            Resection and retrieval were complete.                           Multiple small-mouthed diverticula were found in  the left colon. There was narrowing of the colon in                            association with the diverticular opening.                            Peri-diverticular erythema was seen. There was no                            evidence of diverticular bleeding.                           Internal hemorrhoids were found during                            retroflexion. The hemorrhoids were small and Grade                            I (internal hemorrhoids that do not prolapse).                           The exam was otherwise without abnormality on                            direct and retroflexion views. Complications:            No immediate complications. Estimated blood loss:                            None. Estimated Blood Loss:     Estimated blood loss: none. Impression:               - Two 6 to 7 mm polyps in the rectum and in the                             descending colon, removed with a cold snare.                            Resected and retrieved.                           - Moderate diverticulosis in the left colon.                           - Internal hemorrhoids.                           - The examination was otherwise normal on direct                            and retroflexion views. Recommendation:           - Repeat colonoscopy after studies are complete for                            surveillance based on pathology results.                           -  Patient has a contact number available for                            emergencies. The signs and symptoms of potential                            delayed complications were discussed with the                            patient. Return to normal activities tomorrow.                            Written discharge instructions were provided to the                            patient.                           - High fiber diet long term.                           - Continue present medications.                           - Await pathology results. Ladene Artist, MD 02/03/2020 2:23:38 PM This report has been signed electronically.

## 2020-02-03 NOTE — Progress Notes (Signed)
Called to room to assist during endoscopic procedure.  Patient ID and intended procedure confirmed with present staff. Received instructions for my participation in the procedure from the performing physician.  

## 2020-02-03 NOTE — Progress Notes (Signed)
Vitals by NS. 

## 2020-02-03 NOTE — Progress Notes (Signed)
Report to PACU, RN, vss, BBS= Clear.  

## 2020-02-03 NOTE — Patient Instructions (Signed)

## 2020-02-04 ENCOUNTER — Encounter: Payer: BC Managed Care – PPO | Admitting: Gastroenterology

## 2020-02-07 ENCOUNTER — Telehealth: Payer: Self-pay | Admitting: *Deleted

## 2020-02-07 NOTE — Telephone Encounter (Signed)
  Follow up Call-  Call back number 02/03/2020  Post procedure Call Back phone  # [phone]  Permission to leave phone message Yes  Some recent data might be hidden     Patient questions:  Do you have a fever, pain , or abdominal swelling? No. Pain Score  0 *  Have you tolerated food without any problems? Yes.    Have you been able to return to your normal activities? Yes.    Do you have any questions about your discharge instructions: Diet   No. Medications  No. Follow up visit  No.  Do you have questions or concerns about your Care? No.  Actions: * If pain score is 4 or above: No action needed, pain <4.  1. Have you developed a fever since your procedure? no  2.   Have you had an respiratory symptoms (SOB or cough) since your procedure? no  3.   Have you tested positive for COVID 19 since your procedure no  4.   Have you had any family members/close contacts diagnosed with the COVID 19 since your procedure?  no   If yes to any of these questions please route to Joylene John, RN and Joella Prince, RN

## 2020-02-07 NOTE — Telephone Encounter (Signed)
Attempted f/u phone call. No answer. Left message. °

## 2020-02-11 ENCOUNTER — Other Ambulatory Visit: Payer: Self-pay | Admitting: Internal Medicine

## 2020-02-11 DIAGNOSIS — F902 Attention-deficit hyperactivity disorder, combined type: Secondary | ICD-10-CM

## 2020-02-14 ENCOUNTER — Other Ambulatory Visit: Payer: Self-pay | Admitting: Internal Medicine

## 2020-02-14 DIAGNOSIS — F902 Attention-deficit hyperactivity disorder, combined type: Secondary | ICD-10-CM

## 2020-02-15 ENCOUNTER — Encounter: Payer: Self-pay | Admitting: Internal Medicine

## 2020-02-15 NOTE — Telephone Encounter (Signed)
amphetamine-dextroamphetamine (ADDERALL XR) 20 MG 24 hr capsule  Please deny Last refill was given at office visit 10/28/19 Patient needs an office visit/ virtual visit

## 2020-02-16 ENCOUNTER — Encounter: Payer: Self-pay | Admitting: Gastroenterology

## 2020-02-18 DIAGNOSIS — J3081 Allergic rhinitis due to animal (cat) (dog) hair and dander: Secondary | ICD-10-CM | POA: Diagnosis not present

## 2020-02-18 DIAGNOSIS — J301 Allergic rhinitis due to pollen: Secondary | ICD-10-CM | POA: Diagnosis not present

## 2020-02-18 DIAGNOSIS — J3089 Other allergic rhinitis: Secondary | ICD-10-CM | POA: Diagnosis not present

## 2020-02-22 ENCOUNTER — Telehealth (INDEPENDENT_AMBULATORY_CARE_PROVIDER_SITE_OTHER): Payer: BC Managed Care – PPO | Admitting: Internal Medicine

## 2020-02-22 ENCOUNTER — Encounter: Payer: Self-pay | Admitting: Internal Medicine

## 2020-02-22 VITALS — Wt 195.0 lb

## 2020-02-22 DIAGNOSIS — I10 Essential (primary) hypertension: Secondary | ICD-10-CM | POA: Diagnosis not present

## 2020-02-22 DIAGNOSIS — F902 Attention-deficit hyperactivity disorder, combined type: Secondary | ICD-10-CM | POA: Diagnosis not present

## 2020-02-22 MED ORDER — AMPHETAMINE-DEXTROAMPHET ER 20 MG PO CP24: 20.0000 mg | ORAL_CAPSULE | Freq: Every day | ORAL | 0 refills | Status: DC

## 2020-02-22 MED ORDER — AMPHETAMINE-DEXTROAMPHET ER 20 MG PO CP24: 20.0000 mg | ORAL_CAPSULE | Freq: Every morning | ORAL | 0 refills | Status: DC

## 2020-02-22 MED ORDER — AMLODIPINE BESYLATE-VALSARTAN 5-320 MG PO TABS: 1.0000 | ORAL_TABLET | Freq: Every day | ORAL | 1 refills | Status: DC

## 2020-02-22 NOTE — Progress Notes (Signed)
Virtual Visit via Video Note  I connected with Adam Terry on 02/22/20 at  4:00 PM EST by a video enabled telemedicine application and verified that I am speaking with the correct person using two identifiers.  Location patient: home Location provider: work office Persons participating in the virtual visit: patient, provider  I discussed the limitations of evaluation and management by telemedicine and the availability of in person appointments. The patient expressed understanding and agreed to proceed.   HPI: He has scheduled this visit for the purpose of medication refills, mainly of Adderall per contract.  He is tolerating his XR 20 mg daily well.  He has no acute complaints today.  He is also requesting a refill of his Exforge.  He just had his colonoscopy in January he was asked to follow-up in 7 years.   ROS: Constitutional: Denies fever, chills, diaphoresis, appetite change and fatigue.  HEENT: Denies photophobia, eye pain, redness, hearing loss, ear pain, congestion, sore throat, rhinorrhea, sneezing, mouth sores, trouble swallowing, neck pain, neck stiffness and tinnitus.   Respiratory: Denies SOB, DOE, cough, chest tightness,  and wheezing.   Cardiovascular: Denies chest pain, palpitations and leg swelling.  Gastrointestinal: Denies nausea, vomiting, abdominal pain, diarrhea, constipation, blood in stool and abdominal distention.  Genitourinary: Denies dysuria, urgency, frequency, hematuria, flank pain and difficulty urinating.  Endocrine: Denies: hot or cold intolerance, sweats, changes in hair or nails, polyuria, polydipsia. Musculoskeletal: Denies myalgias, back pain, joint swelling, arthralgias and gait problem.  Skin: Denies pallor, rash and wound.  Neurological: Denies dizziness, seizures, syncope, weakness, light-headedness, numbness and headaches.  Hematological: Denies adenopathy. Easy bruising, personal or family bleeding history  Psychiatric/Behavioral:  Denies suicidal ideation, mood changes, confusion, nervousness, sleep disturbance and agitation   Past Medical History:  Diagnosis Date  . ADHD (attention deficit hyperactivity disorder)   . ALLERGIC RHINITIS 09/17/2007  . Allergy   . Asthma   . Cataract    forming   . Chronic kidney disease    kidney stones   . Hypertension   . NEPHROLITHIASIS, HX OF 10/20/2007  . PONV (postoperative nausea and vomiting)    with anesthesia gas   . STONE, URINARY CALCULUS,UNSPEC. 09/17/2007    Past Surgical History:  Procedure Laterality Date  . patch to ear drum    . TONSILLECTOMY    . TYMPANOSTOMY TUBE PLACEMENT     x 13     Family History  Problem Relation Age of Onset  . Urolithiasis Mother   . Leukemia Maternal Uncle   . Heart disease Maternal Grandmother   . Breast cancer Maternal Grandmother   . Lung cancer Maternal Grandmother   . Colon cancer Neg Hx   . Colon polyps Neg Hx   . Esophageal cancer Neg Hx   . Stomach cancer Neg Hx   . Rectal cancer Neg Hx     SOCIAL HX:   reports that he has never smoked. He has never used smokeless tobacco. He reports current alcohol use. He reports that he does not use drugs.   Current Outpatient Medications:  .  albuterol (PROVENTIL HFA;VENTOLIN HFA) 108 (90 Base) MCG/ACT inhaler, Inhale 2 puffs into the lungs every 6 (six) hours as needed for wheezing or shortness of breath., Disp: 1 Inhaler, Rfl: 0 .  amphetamine-dextroamphetamine (ADDERALL XR) 20 MG 24 hr capsule, Take 1 capsule (20 mg total) by mouth daily., Disp: 30 capsule, Rfl: 0 .  amphetamine-dextroamphetamine (ADDERALL XR) 20 MG 24 hr capsule, Take 1 capsule (  20 mg total) by mouth every morning., Disp: 30 capsule, Rfl: 0 .  amphetamine-dextroamphetamine (ADDERALL XR) 20 MG 24 hr capsule, Take 1 capsule (20 mg total) by mouth daily., Disp: 30 capsule, Rfl: 0 .  aspirin-acetaminophen-caffeine (EXCEDRIN MIGRAINE) 250-250-65 MG per tablet, Take 1 tablet by mouth every 6 (six) hours as  needed for headache., Disp: , Rfl:  .  azelastine (ASTELIN) 0.1 % nasal spray, Place 2 sprays into both nostrils 2 (two) times daily. Use in each nostril as directed, Disp: 30 mL, Rfl: 12 .  budesonide-formoterol (SYMBICORT) 160-4.5 MCG/ACT inhaler, Inhale 2 puffs into the lungs 2 (two) times daily., Disp: 1 Inhaler, Rfl: 3 .  chlorpheniramine-HYDROcodone (TUSSIONEX PENNKINETIC ER) 10-8 MG/5ML SUER, Take 5 mLs by mouth every 12 (twelve) hours as needed for cough., Disp: 140 mL, Rfl: 0 .  EPINEPHrine 0.3 mg/0.3 mL IJ SOAJ injection, See admin instructions., Disp: , Rfl:  .  fluticasone (FLONASE SENSIMIST) 27.5 MCG/SPRAY nasal spray, 2 sprays each nostril, Disp: , Rfl:  .  ketotifen (ZADITOR) 0.025 % ophthalmic solution, 1 drop into affected eye, Disp: , Rfl:  .  levocetirizine (XYZAL) 5 MG tablet, Take 1 tablet (5 mg total) by mouth daily., Disp: 60 tablet, Rfl: 4 .  montelukast (SINGULAIR) 10 MG tablet, Take 1 tablet (10 mg total) by mouth at bedtime., Disp: 30 tablet, Rfl: 6 .  Spacer/Aero-Holding Chambers (AEROCHAMBER MAX W/FLOW-VU) MISC, See admin instructions., Disp: , Rfl:  .  amLODipine-valsartan (EXFORGE) 5-320 MG tablet, Take 1 tablet by mouth daily., Disp: 90 tablet, Rfl: 1  EXAM:   VITALS per patient if applicable: none reported  GENERAL: alert, oriented, appears well and in no acute distress  HEENT: atraumatic, conjunttiva clear, no obvious abnormalities on inspection of external nose and ears  NECK: normal movements of the head and neck  LUNGS: on inspection no signs of respiratory distress, breathing rate appears normal, no obvious gross increased work of breathing, gasping or wheezing  CV: no obvious cyanosis  MS: moves all visible extremities without noticeable abnormality  PSYCH/NEURO: pleasant and cooperative, no obvious depression or anxiety, speech and thought processing grossly intact  ASSESSMENT AND PLAN:   Essential hypertension  - Plan: amLODipine-valsartan  (EXFORGE) 5-320 MG tablet -BP has been well controlled at home.  Attention deficit hyperactivity disorder (ADHD), combined type  -PDMP reviewed, no red flags, overdose or score 190. -Refill Adderall XR 20 mg to take 1 tablet daily for 30 tablets a month x3 months.     I discussed the assessment and treatment plan with the patient. The patient was provided an opportunity to ask questions and all were answered. The patient agreed with the plan and demonstrated an understanding of the instructions.   The patient was advised to call back or seek an in-person evaluation if the symptoms worsen or if the condition fails to improve as anticipated.    Lelon Frohlich, MD  [facility]

## 2020-02-24 ENCOUNTER — Telehealth: Payer: Self-pay | Admitting: *Deleted

## 2020-02-24 NOTE — Telephone Encounter (Signed)
Prior auth started for  Adderall xr 20 Key: [facility]

## 2020-03-17 DIAGNOSIS — J3081 Allergic rhinitis due to animal (cat) (dog) hair and dander: Secondary | ICD-10-CM | POA: Diagnosis not present

## 2020-03-17 DIAGNOSIS — J3089 Other allergic rhinitis: Secondary | ICD-10-CM | POA: Diagnosis not present

## 2020-03-17 DIAGNOSIS — J301 Allergic rhinitis due to pollen: Secondary | ICD-10-CM | POA: Diagnosis not present

## 2020-04-06 DIAGNOSIS — J3081 Allergic rhinitis due to animal (cat) (dog) hair and dander: Secondary | ICD-10-CM | POA: Diagnosis not present

## 2020-04-06 DIAGNOSIS — J302 Other seasonal allergic rhinitis: Secondary | ICD-10-CM | POA: Diagnosis not present

## 2020-04-06 DIAGNOSIS — J301 Allergic rhinitis due to pollen: Secondary | ICD-10-CM | POA: Diagnosis not present

## 2020-04-06 DIAGNOSIS — J3089 Other allergic rhinitis: Secondary | ICD-10-CM | POA: Diagnosis not present

## 2020-04-06 DIAGNOSIS — H1045 Other chronic allergic conjunctivitis: Secondary | ICD-10-CM | POA: Diagnosis not present

## 2020-04-27 DIAGNOSIS — J3081 Allergic rhinitis due to animal (cat) (dog) hair and dander: Secondary | ICD-10-CM | POA: Diagnosis not present

## 2020-04-27 DIAGNOSIS — J301 Allergic rhinitis due to pollen: Secondary | ICD-10-CM | POA: Diagnosis not present

## 2020-04-27 DIAGNOSIS — J3089 Other allergic rhinitis: Secondary | ICD-10-CM | POA: Diagnosis not present

## 2020-05-07 DIAGNOSIS — J3081 Allergic rhinitis due to animal (cat) (dog) hair and dander: Secondary | ICD-10-CM | POA: Diagnosis not present

## 2020-05-07 DIAGNOSIS — J301 Allergic rhinitis due to pollen: Secondary | ICD-10-CM | POA: Diagnosis not present

## 2020-05-08 DIAGNOSIS — J301 Allergic rhinitis due to pollen: Secondary | ICD-10-CM | POA: Diagnosis not present

## 2020-05-08 DIAGNOSIS — J302 Other seasonal allergic rhinitis: Secondary | ICD-10-CM | POA: Diagnosis not present

## 2020-05-08 DIAGNOSIS — J3081 Allergic rhinitis due to animal (cat) (dog) hair and dander: Secondary | ICD-10-CM | POA: Diagnosis not present

## 2020-05-12 DIAGNOSIS — J3081 Allergic rhinitis due to animal (cat) (dog) hair and dander: Secondary | ICD-10-CM | POA: Diagnosis not present

## 2020-05-12 DIAGNOSIS — J3089 Other allergic rhinitis: Secondary | ICD-10-CM | POA: Diagnosis not present

## 2020-05-12 DIAGNOSIS — J301 Allergic rhinitis due to pollen: Secondary | ICD-10-CM | POA: Diagnosis not present

## 2020-05-31 DIAGNOSIS — M5412 Radiculopathy, cervical region: Secondary | ICD-10-CM | POA: Diagnosis not present

## 2020-05-31 DIAGNOSIS — M4802 Spinal stenosis, cervical region: Secondary | ICD-10-CM | POA: Diagnosis not present

## 2020-06-05 DIAGNOSIS — J3089 Other allergic rhinitis: Secondary | ICD-10-CM | POA: Diagnosis not present

## 2020-06-05 DIAGNOSIS — J3081 Allergic rhinitis due to animal (cat) (dog) hair and dander: Secondary | ICD-10-CM | POA: Diagnosis not present

## 2020-06-05 DIAGNOSIS — J301 Allergic rhinitis due to pollen: Secondary | ICD-10-CM | POA: Diagnosis not present

## 2020-06-23 DIAGNOSIS — J3081 Allergic rhinitis due to animal (cat) (dog) hair and dander: Secondary | ICD-10-CM | POA: Diagnosis not present

## 2020-06-23 DIAGNOSIS — J3089 Other allergic rhinitis: Secondary | ICD-10-CM | POA: Diagnosis not present

## 2020-06-23 DIAGNOSIS — J301 Allergic rhinitis due to pollen: Secondary | ICD-10-CM | POA: Diagnosis not present

## 2020-07-06 DIAGNOSIS — M5412 Radiculopathy, cervical region: Secondary | ICD-10-CM | POA: Diagnosis not present

## 2020-07-25 DIAGNOSIS — J301 Allergic rhinitis due to pollen: Secondary | ICD-10-CM | POA: Diagnosis not present

## 2020-07-25 DIAGNOSIS — J3081 Allergic rhinitis due to animal (cat) (dog) hair and dander: Secondary | ICD-10-CM | POA: Diagnosis not present

## 2020-07-25 DIAGNOSIS — J3089 Other allergic rhinitis: Secondary | ICD-10-CM | POA: Diagnosis not present

## 2020-07-27 DIAGNOSIS — J301 Allergic rhinitis due to pollen: Secondary | ICD-10-CM | POA: Diagnosis not present

## 2020-07-27 DIAGNOSIS — J3081 Allergic rhinitis due to animal (cat) (dog) hair and dander: Secondary | ICD-10-CM | POA: Diagnosis not present

## 2020-07-27 DIAGNOSIS — J3089 Other allergic rhinitis: Secondary | ICD-10-CM | POA: Diagnosis not present

## 2020-08-01 DIAGNOSIS — J3081 Allergic rhinitis due to animal (cat) (dog) hair and dander: Secondary | ICD-10-CM | POA: Diagnosis not present

## 2020-08-01 DIAGNOSIS — J3089 Other allergic rhinitis: Secondary | ICD-10-CM | POA: Diagnosis not present

## 2020-08-01 DIAGNOSIS — J301 Allergic rhinitis due to pollen: Secondary | ICD-10-CM | POA: Diagnosis not present

## 2020-08-03 DIAGNOSIS — M4802 Spinal stenosis, cervical region: Secondary | ICD-10-CM | POA: Diagnosis not present

## 2020-08-03 DIAGNOSIS — J3089 Other allergic rhinitis: Secondary | ICD-10-CM | POA: Diagnosis not present

## 2020-08-03 DIAGNOSIS — J301 Allergic rhinitis due to pollen: Secondary | ICD-10-CM | POA: Diagnosis not present

## 2020-08-03 DIAGNOSIS — M5412 Radiculopathy, cervical region: Secondary | ICD-10-CM | POA: Diagnosis not present

## 2020-08-03 DIAGNOSIS — M4722 Other spondylosis with radiculopathy, cervical region: Secondary | ICD-10-CM | POA: Diagnosis not present

## 2020-08-03 DIAGNOSIS — J3081 Allergic rhinitis due to animal (cat) (dog) hair and dander: Secondary | ICD-10-CM | POA: Diagnosis not present

## 2020-08-07 DIAGNOSIS — I10 Essential (primary) hypertension: Secondary | ICD-10-CM | POA: Diagnosis not present

## 2020-08-07 DIAGNOSIS — Z6828 Body mass index (BMI) 28.0-28.9, adult: Secondary | ICD-10-CM | POA: Diagnosis not present

## 2020-08-07 DIAGNOSIS — M4712 Other spondylosis with myelopathy, cervical region: Secondary | ICD-10-CM | POA: Diagnosis not present

## 2020-08-10 DIAGNOSIS — J3081 Allergic rhinitis due to animal (cat) (dog) hair and dander: Secondary | ICD-10-CM | POA: Diagnosis not present

## 2020-08-10 DIAGNOSIS — J3089 Other allergic rhinitis: Secondary | ICD-10-CM | POA: Diagnosis not present

## 2020-08-10 DIAGNOSIS — J301 Allergic rhinitis due to pollen: Secondary | ICD-10-CM | POA: Diagnosis not present

## 2020-08-16 ENCOUNTER — Other Ambulatory Visit: Payer: Self-pay | Admitting: Internal Medicine

## 2020-08-16 ENCOUNTER — Encounter: Payer: Self-pay | Admitting: Internal Medicine

## 2020-08-16 DIAGNOSIS — F902 Attention-deficit hyperactivity disorder, combined type: Secondary | ICD-10-CM

## 2020-08-16 NOTE — Telephone Encounter (Signed)
Last refill was given at office visit 02/22/20.  Patient will need an office visit.

## 2020-08-17 DIAGNOSIS — M4712 Other spondylosis with myelopathy, cervical region: Secondary | ICD-10-CM | POA: Diagnosis not present

## 2020-08-17 DIAGNOSIS — M542 Cervicalgia: Secondary | ICD-10-CM | POA: Diagnosis not present

## 2020-08-17 DIAGNOSIS — Z6828 Body mass index (BMI) 28.0-28.9, adult: Secondary | ICD-10-CM | POA: Diagnosis not present

## 2020-08-17 DIAGNOSIS — G992 Myelopathy in diseases classified elsewhere: Secondary | ICD-10-CM | POA: Diagnosis not present

## 2020-08-17 DIAGNOSIS — M4802 Spinal stenosis, cervical region: Secondary | ICD-10-CM | POA: Diagnosis not present

## 2020-08-18 ENCOUNTER — Telehealth (INDEPENDENT_AMBULATORY_CARE_PROVIDER_SITE_OTHER): Payer: BC Managed Care – PPO | Admitting: Internal Medicine

## 2020-08-18 DIAGNOSIS — F902 Attention-deficit hyperactivity disorder, combined type: Secondary | ICD-10-CM

## 2020-08-18 MED ORDER — AMPHETAMINE-DEXTROAMPHET ER 20 MG PO CP24: 20.0000 mg | ORAL_CAPSULE | Freq: Every day | ORAL | 0 refills | Status: DC

## 2020-08-18 MED ORDER — AMPHETAMINE-DEXTROAMPHET ER 20 MG PO CP24: 20.0000 mg | ORAL_CAPSULE | Freq: Every morning | ORAL | 0 refills | Status: DC

## 2020-08-18 NOTE — Progress Notes (Signed)
Virtual Visit via Video Note  I connected with Adam Terry on 08/18/20 at  4:00 PM EDT by a video enabled telemedicine application and verified that I am speaking with the correct person using two identifiers.  Location patient: home Location provider: work office Persons participating in the virtual visit: patient, provider  I discussed the limitations of evaluation and management by telemedicine and the availability of in person appointments. The patient expressed understanding and agreed to proceed.   HPI: This visit has been scheduled for the purpose of medication refills per controlled substance contract.  He has a history of ADHD and is on Adderall XR 20 mg daily.  He is tolerating medication well, he has no acute concerns today.   ROS: Constitutional: Denies fever, chills, diaphoresis, appetite change and fatigue.  HEENT: Denies photophobia, eye pain, redness, hearing loss, ear pain, congestion, sore throat, rhinorrhea, sneezing, mouth sores, trouble swallowing, neck pain, neck stiffness and tinnitus.   Respiratory: Denies SOB, DOE, cough, chest tightness,  and wheezing.   Cardiovascular: Denies chest pain, palpitations and leg swelling.  Gastrointestinal: Denies nausea, vomiting, abdominal pain, diarrhea, constipation, blood in stool and abdominal distention.  Genitourinary: Denies dysuria, urgency, frequency, hematuria, flank pain and difficulty urinating.  Endocrine: Denies: hot or cold intolerance, sweats, changes in hair or nails, polyuria, polydipsia. Musculoskeletal: Denies myalgias, back pain, joint swelling, arthralgias and gait problem.  Skin: Denies pallor, rash and wound.  Neurological: Denies dizziness, seizures, syncope, weakness, light-headedness, numbness and headaches.  Hematological: Denies adenopathy. Easy bruising, personal or family bleeding history  Psychiatric/Behavioral: Denies suicidal ideation, mood changes, confusion, nervousness, sleep  disturbance and agitation   Past Medical History:  Diagnosis Date   ADHD (attention deficit hyperactivity disorder)    ALLERGIC RHINITIS 09/17/2007   Allergy    Asthma    Cataract    forming    Chronic kidney disease    kidney stones    Hypertension    NEPHROLITHIASIS, HX OF 10/20/2007   PONV (postoperative nausea and vomiting)    with anesthesia gas    STONE, URINARY CALCULUS,UNSPEC. 09/17/2007    Past Surgical History:  Procedure Laterality Date   patch to ear drum     TONSILLECTOMY     TYMPANOSTOMY TUBE PLACEMENT     x 13     Family History  Problem Relation Age of Onset   Urolithiasis Mother    Leukemia Maternal Uncle    Heart disease Maternal Grandmother    Breast cancer Maternal Grandmother    Lung cancer Maternal Grandmother    Colon cancer Neg Hx    Colon polyps Neg Hx    Esophageal cancer Neg Hx    Stomach cancer Neg Hx    Rectal cancer Neg Hx     SOCIAL HX:   reports that he has never smoked. He has never used smokeless tobacco. He reports current alcohol use. He reports that he does not use drugs.   Current Outpatient Medications:    albuterol (PROVENTIL HFA;VENTOLIN HFA) 108 (90 Base) MCG/ACT inhaler, Inhale 2 puffs into the lungs every 6 (six) hours as needed for wheezing or shortness of breath., Disp: 1 Inhaler, Rfl: 0   amLODipine-valsartan (EXFORGE) 5-320 MG tablet, Take 1 tablet by mouth daily., Disp: 90 tablet, Rfl: 1   aspirin-acetaminophen-caffeine (EXCEDRIN MIGRAINE) 250-250-65 MG per tablet, Take 1 tablet by mouth every 6 (six) hours as needed for headache., Disp: , Rfl:    azelastine (ASTELIN) 0.1 % nasal spray, Place 2  sprays into both nostrils 2 (two) times daily. Use in each nostril as directed, Disp: 30 mL, Rfl: 12   budesonide-formoterol (SYMBICORT) 160-4.5 MCG/ACT inhaler, Inhale 2 puffs into the lungs 2 (two) times daily., Disp: 1 Inhaler, Rfl: 3   chlorpheniramine-HYDROcodone (TUSSIONEX PENNKINETIC ER) 10-8 MG/5ML SUER, Take 5 mLs by mouth  every 12 (twelve) hours as needed for cough., Disp: 140 mL, Rfl: 0   EPINEPHrine 0.3 mg/0.3 mL IJ SOAJ injection, See admin instructions., Disp: , Rfl:    fluticasone (FLONASE SENSIMIST) 27.5 MCG/SPRAY nasal spray, 2 sprays each nostril, Disp: , Rfl:    ketotifen (ZADITOR) 0.025 % ophthalmic solution, 1 drop into affected eye, Disp: , Rfl:    levocetirizine (XYZAL) 5 MG tablet, Take 1 tablet (5 mg total) by mouth daily., Disp: 60 tablet, Rfl: 4   montelukast (SINGULAIR) 10 MG tablet, Take 1 tablet (10 mg total) by mouth at bedtime., Disp: 30 tablet, Rfl: 6   Spacer/Aero-Holding Chambers (AEROCHAMBER MAX W/FLOW-VU) MISC, See admin instructions., Disp: , Rfl:    amphetamine-dextroamphetamine (ADDERALL XR) 20 MG 24 hr capsule, Take 1 capsule (20 mg total) by mouth daily., Disp: 30 capsule, Rfl: 0   amphetamine-dextroamphetamine (ADDERALL XR) 20 MG 24 hr capsule, Take 1 capsule (20 mg total) by mouth every morning., Disp: 30 capsule, Rfl: 0   amphetamine-dextroamphetamine (ADDERALL XR) 20 MG 24 hr capsule, Take 1 capsule (20 mg total) by mouth daily., Disp: 30 capsule, Rfl: 0  EXAM:   VITALS per patient if applicable: None reported  GENERAL: alert, oriented, appears well and in no acute distress  HEENT: atraumatic, conjunttiva clear, no obvious abnormalities on inspection of external nose and ears, wears corrective lenses  NECK: normal movements of the head and neck  LUNGS: on inspection no signs of respiratory distress, breathing rate appears normal, no obvious gross increased work of breathing, gasping or wheezing  CV: no obvious cyanosis  MS: moves all visible extremities without noticeable abnormality  PSYCH/NEURO: pleasant and cooperative, no obvious depression or anxiety, speech and thought processing grossly intact  ASSESSMENT AND PLAN:   Attention deficit hyperactivity disorder (ADHD), combined type  -PDMP reviewed, no red flags, overdose risk score is 190. -Refill Adderall XR  20 mg to take 1 tablet daily for 30 tablets a month x3 months.    I discussed the assessment and treatment plan with the patient. The patient was provided an opportunity to ask questions and all were answered. The patient agreed with the plan and demonstrated an understanding of the instructions.   The patient was advised to call back or seek an in-person evaluation if the symptoms worsen or if the condition fails to improve as anticipated.    Lelon Frohlich, MD  [facility]

## 2020-08-23 DIAGNOSIS — J3081 Allergic rhinitis due to animal (cat) (dog) hair and dander: Secondary | ICD-10-CM | POA: Diagnosis not present

## 2020-08-23 DIAGNOSIS — J3089 Other allergic rhinitis: Secondary | ICD-10-CM | POA: Diagnosis not present

## 2020-08-23 DIAGNOSIS — J301 Allergic rhinitis due to pollen: Secondary | ICD-10-CM | POA: Diagnosis not present

## 2020-09-08 DIAGNOSIS — J301 Allergic rhinitis due to pollen: Secondary | ICD-10-CM | POA: Diagnosis not present

## 2020-09-08 DIAGNOSIS — J3081 Allergic rhinitis due to animal (cat) (dog) hair and dander: Secondary | ICD-10-CM | POA: Diagnosis not present

## 2020-09-08 DIAGNOSIS — J3089 Other allergic rhinitis: Secondary | ICD-10-CM | POA: Diagnosis not present

## 2020-09-22 DIAGNOSIS — J3081 Allergic rhinitis due to animal (cat) (dog) hair and dander: Secondary | ICD-10-CM | POA: Diagnosis not present

## 2020-09-22 DIAGNOSIS — J301 Allergic rhinitis due to pollen: Secondary | ICD-10-CM | POA: Diagnosis not present

## 2020-09-22 DIAGNOSIS — J3089 Other allergic rhinitis: Secondary | ICD-10-CM | POA: Diagnosis not present

## 2020-10-06 DIAGNOSIS — J3089 Other allergic rhinitis: Secondary | ICD-10-CM | POA: Diagnosis not present

## 2020-10-06 DIAGNOSIS — J3081 Allergic rhinitis due to animal (cat) (dog) hair and dander: Secondary | ICD-10-CM | POA: Diagnosis not present

## 2020-10-06 DIAGNOSIS — J301 Allergic rhinitis due to pollen: Secondary | ICD-10-CM | POA: Diagnosis not present

## 2020-10-09 ENCOUNTER — Encounter: Payer: Self-pay | Admitting: Internal Medicine

## 2020-10-09 NOTE — Telephone Encounter (Signed)
PT has a apt schedule for Thursday and wants to know if he can get some prednisone till hes seen Thursday. Please advise.

## 2020-10-10 NOTE — Telephone Encounter (Signed)
Please advise 

## 2020-10-12 ENCOUNTER — Telehealth (INDEPENDENT_AMBULATORY_CARE_PROVIDER_SITE_OTHER): Payer: BC Managed Care – PPO | Admitting: Internal Medicine

## 2020-10-12 DIAGNOSIS — J069 Acute upper respiratory infection, unspecified: Secondary | ICD-10-CM

## 2020-10-12 DIAGNOSIS — J4541 Moderate persistent asthma with (acute) exacerbation: Secondary | ICD-10-CM

## 2020-10-12 MED ORDER — PREDNISONE 10 MG (21) PO TBPK
ORAL_TABLET | ORAL | 0 refills | Status: DC
Start: 2020-10-12 — End: 2020-12-01

## 2020-10-12 NOTE — Progress Notes (Signed)
Virtual Visit via Video Note  I connected with [name] on 10/12/20 at  7:30 AM EDT by a video enabled telemedicine application and verified that I am speaking with the correct person using two identifiers.  Location patient: home Location provider: work office Persons participating in the virtual visit: patient, provider  I discussed the limitations of evaluation and management by telemedicine and the availability of in person appointments. The patient expressed understanding and agreed to proceed.   HPI: His daughter was sick last week with URI symptoms.  She was tested for COVID, flu and strep throat and was negative.  She is improving, however now he is sick with some of the same symptoms, mainly postnasal drip, nasal congestion and coughing which is flaring up his asthma.  He has been using his albuterol inhaler much more than usual.  Feels like his chest is tight.  He is requesting a prednisone prescription.   ROS: Constitutional: Denies fever, chills, diaphoresis, appetite change and fatigue.  HEENT: Denies photophobia, eye pain, redness, hearing loss,, mouth sores, trouble swallowing, neck pain, neck stiffness and tinnitus.   Respiratory: Denies  chest tightness,  and wheezing.   Cardiovascular: Denies chest pain, palpitations and leg swelling.  Gastrointestinal: Denies nausea, vomiting, abdominal pain, diarrhea, constipation, blood in stool and abdominal distention.  Genitourinary: Denies dysuria, urgency, frequency, hematuria, flank pain and difficulty urinating.  Endocrine: Denies: hot or cold intolerance, sweats, changes in hair or nails, polyuria, polydipsia. Musculoskeletal: Denies myalgias, back pain, joint swelling, arthralgias and gait problem.  Skin: Denies pallor, rash and wound.  Neurological: Denies dizziness, seizures, syncope, weakness, light-headedness, numbness and headaches.  Hematological: Denies adenopathy. Easy bruising, personal or family bleeding  history  Psychiatric/Behavioral: Denies suicidal ideation, mood changes, confusion, nervousness, sleep disturbance and agitation   Past Medical History:  Diagnosis Date   ADHD (attention deficit hyperactivity disorder)    ALLERGIC RHINITIS 09/17/2007   Allergy    Asthma    Cataract    forming    Chronic kidney disease    kidney stones    Hypertension    NEPHROLITHIASIS, HX OF 10/20/2007   PONV (postoperative nausea and vomiting)    with anesthesia gas    STONE, URINARY CALCULUS,UNSPEC. 09/17/2007    Past Surgical History:  Procedure Laterality Date   patch to ear drum     TONSILLECTOMY     TYMPANOSTOMY TUBE PLACEMENT     x 13     Family History  Problem Relation Age of Onset   Urolithiasis Mother    Leukemia Maternal Uncle    Heart disease Maternal Grandmother    Breast cancer Maternal Grandmother    Lung cancer Maternal Grandmother    Colon cancer Neg Hx    Colon polyps Neg Hx    Esophageal cancer Neg Hx    Stomach cancer Neg Hx    Rectal cancer Neg Hx     SOCIAL HX:   reports that he has never smoked. He has never used smokeless tobacco. He reports current alcohol use. He reports that he does not use drugs.   Current Outpatient Medications:    predniSONE (STERAPRED UNI-PAK 21 TAB) 10 MG (21) TBPK tablet, Take as directed, Disp: 21 tablet, Rfl: 0   albuterol (PROVENTIL HFA;VENTOLIN HFA) 108 (90 Base) MCG/ACT inhaler, Inhale 2 puffs into the lungs every 6 (six) hours as needed for wheezing or shortness of breath., Disp: 1 Inhaler, Rfl: 0   amLODipine-valsartan (EXFORGE) 5-320 MG tablet, Take 1  tablet by mouth daily., Disp: 90 tablet, Rfl: 1   amphetamine-dextroamphetamine (ADDERALL XR) 20 MG 24 hr capsule, Take 1 capsule (20 mg total) by mouth daily., Disp: 30 capsule, Rfl: 0   amphetamine-dextroamphetamine (ADDERALL XR) 20 MG 24 hr capsule, Take 1 capsule (20 mg total) by mouth every morning., Disp: 30 capsule, Rfl: 0   amphetamine-dextroamphetamine (ADDERALL XR) 20  MG 24 hr capsule, Take 1 capsule (20 mg total) by mouth daily., Disp: 30 capsule, Rfl: 0   aspirin-acetaminophen-caffeine (EXCEDRIN MIGRAINE) 250-250-65 MG per tablet, Take 1 tablet by mouth every 6 (six) hours as needed for headache., Disp: , Rfl:    azelastine (ASTELIN) 0.1 % nasal spray, Place 2 sprays into both nostrils 2 (two) times daily. Use in each nostril as directed, Disp: 30 mL, Rfl: 12   budesonide-formoterol (SYMBICORT) 160-4.5 MCG/ACT inhaler, Inhale 2 puffs into the lungs 2 (two) times daily., Disp: 1 Inhaler, Rfl: 3   chlorpheniramine-HYDROcodone (TUSSIONEX PENNKINETIC ER) 10-8 MG/5ML SUER, Take 5 mLs by mouth every 12 (twelve) hours as needed for cough., Disp: 140 mL, Rfl: 0   EPINEPHrine 0.3 mg/0.3 mL IJ SOAJ injection, See admin instructions., Disp: , Rfl:    fluticasone (FLONASE SENSIMIST) 27.5 MCG/SPRAY nasal spray, 2 sprays each nostril, Disp: , Rfl:    ketotifen (ZADITOR) 0.025 % ophthalmic solution, 1 drop into affected eye, Disp: , Rfl:    levocetirizine (XYZAL) 5 MG tablet, Take 1 tablet (5 mg total) by mouth daily., Disp: 60 tablet, Rfl: 4   montelukast (SINGULAIR) 10 MG tablet, Take 1 tablet (10 mg total) by mouth at bedtime., Disp: 30 tablet, Rfl: 6   Spacer/Aero-Holding Chambers (AEROCHAMBER MAX W/FLOW-VU) MISC, See admin instructions., Disp: , Rfl:   EXAM:   VITALS per patient if applicable: None reported  GENERAL: alert, oriented, appears well and in no acute distress  HEENT: atraumatic, conjunttiva clear, no obvious abnormalities on inspection of external nose and ears, wears corrective lenses  NECK: normal movements of the head and neck  LUNGS: on inspection no signs of respiratory distress, breathing rate appears normal, no obvious gross increased work of breathing, gasping or wheezing  CV: no obvious cyanosis  MS: moves all visible extremities without noticeable abnormality  PSYCH/NEURO: pleasant and cooperative, no obvious depression or anxiety, speech  and thought processing grossly intact  ASSESSMENT AND PLAN:   URI with cough and congestion Moderate persistent asthma with acute exacerbation  - Plan: predniSONE (STERAPRED UNI-PAK 21 TAB) 10 MG (21) TBPK tablet -He has been using over-the-counter flu and cough remedies with relief. -However, he remains with chest tightness due to his asthma from the persistent coughing and is requesting prednisone prescription. -I think this is appropriate and we will send in a taper today. -He knows to reach out to [place] if symptoms fail to improve for in person evaluation.     I discussed the assessment and treatment plan with the patient. The patient was provided an opportunity to ask questions and all were answered. The patient agreed with the plan and demonstrated an understanding of the instructions.   The patient was advised to call back or seek an in-person evaluation if the symptoms worsen or if the condition fails to improve as anticipated.    Lelon Frohlich, MD  [facility]

## 2020-10-27 DIAGNOSIS — M4802 Spinal stenosis, cervical region: Secondary | ICD-10-CM | POA: Diagnosis not present

## 2020-10-27 DIAGNOSIS — M2578 Osteophyte, vertebrae: Secondary | ICD-10-CM | POA: Diagnosis not present

## 2020-10-31 DIAGNOSIS — J3081 Allergic rhinitis due to animal (cat) (dog) hair and dander: Secondary | ICD-10-CM | POA: Diagnosis not present

## 2020-10-31 DIAGNOSIS — J301 Allergic rhinitis due to pollen: Secondary | ICD-10-CM | POA: Diagnosis not present

## 2020-10-31 DIAGNOSIS — J3089 Other allergic rhinitis: Secondary | ICD-10-CM | POA: Diagnosis not present

## 2020-11-16 DIAGNOSIS — M4712 Other spondylosis with myelopathy, cervical region: Secondary | ICD-10-CM | POA: Diagnosis not present

## 2020-11-23 DIAGNOSIS — J302 Other seasonal allergic rhinitis: Secondary | ICD-10-CM | POA: Diagnosis not present

## 2020-11-23 DIAGNOSIS — H1045 Other chronic allergic conjunctivitis: Secondary | ICD-10-CM | POA: Diagnosis not present

## 2020-11-23 DIAGNOSIS — J3081 Allergic rhinitis due to animal (cat) (dog) hair and dander: Secondary | ICD-10-CM | POA: Diagnosis not present

## 2020-11-23 DIAGNOSIS — J3089 Other allergic rhinitis: Secondary | ICD-10-CM | POA: Diagnosis not present

## 2020-11-23 DIAGNOSIS — J301 Allergic rhinitis due to pollen: Secondary | ICD-10-CM | POA: Diagnosis not present

## 2020-12-01 ENCOUNTER — Ambulatory Visit (INDEPENDENT_AMBULATORY_CARE_PROVIDER_SITE_OTHER): Payer: BC Managed Care – PPO | Admitting: Internal Medicine

## 2020-12-01 ENCOUNTER — Encounter: Payer: Self-pay | Admitting: Internal Medicine

## 2020-12-01 ENCOUNTER — Other Ambulatory Visit: Payer: Self-pay | Admitting: Internal Medicine

## 2020-12-01 VITALS — BP 120/80 | HR 82 | Temp 98.1°F | Ht 68.0 in | Wt 186.6 lb

## 2020-12-01 DIAGNOSIS — Z0000 Encounter for general adult medical examination without abnormal findings: Secondary | ICD-10-CM | POA: Diagnosis not present

## 2020-12-01 DIAGNOSIS — F902 Attention-deficit hyperactivity disorder, combined type: Secondary | ICD-10-CM

## 2020-12-01 DIAGNOSIS — Z23 Encounter for immunization: Secondary | ICD-10-CM

## 2020-12-01 DIAGNOSIS — E559 Vitamin D deficiency, unspecified: Secondary | ICD-10-CM | POA: Diagnosis not present

## 2020-12-01 DIAGNOSIS — J4540 Moderate persistent asthma, uncomplicated: Secondary | ICD-10-CM | POA: Diagnosis not present

## 2020-12-01 DIAGNOSIS — I10 Essential (primary) hypertension: Secondary | ICD-10-CM | POA: Diagnosis not present

## 2020-12-01 LAB — CBC WITH DIFFERENTIAL/PLATELET
Basophils Absolute: 0 10*3/uL (ref 0.0–0.1)
Basophils Relative: 0.8 % (ref 0.0–3.0)
Eosinophils Absolute: 0.1 10*3/uL (ref 0.0–0.7)
Eosinophils Relative: 2.2 % (ref 0.0–5.0)
HCT: 44.6 % (ref 39.0–52.0)
Hemoglobin: 15.1 g/dL (ref 13.0–17.0)
Lymphocytes Relative: 25 % (ref 12.0–46.0)
Lymphs Abs: 1.3 10*3/uL (ref 0.7–4.0)
MCHC: 33.9 g/dL (ref 30.0–36.0)
MCV: 91.9 fl (ref 78.0–100.0)
Monocytes Absolute: 0.5 10*3/uL (ref 0.1–1.0)
Monocytes Relative: 8.9 % (ref 3.0–12.0)
Neutro Abs: 3.4 10*3/uL (ref 1.4–7.7)
Neutrophils Relative %: 63.1 % (ref 43.0–77.0)
Platelets: 214 10*3/uL (ref 150.0–400.0)
RBC: 4.86 Mil/uL (ref 4.22–5.81)
RDW: 13.2 % (ref 11.5–15.5)
WBC: 5.4 10*3/uL (ref 4.0–10.5)

## 2020-12-01 LAB — COMPREHENSIVE METABOLIC PANEL
ALT: 17 U/L (ref 0–53)
AST: 19 U/L (ref 0–37)
Albumin: 4.9 g/dL (ref 3.5–5.2)
Alkaline Phosphatase: 66 U/L (ref 39–117)
BUN: 15 mg/dL (ref 6–23)
CO2: 30 mEq/L (ref 19–32)
Calcium: 9.7 mg/dL (ref 8.4–10.5)
Chloride: 100 mEq/L (ref 96–112)
Creatinine, Ser: 1.07 mg/dL (ref 0.40–1.50)
GFR: 82.76 mL/min (ref >60.00)
Glucose, Bld: 93 mg/dL (ref 70–99)
Potassium: 4.6 mEq/L (ref 3.5–5.1)
Sodium: 137 mEq/L (ref 135–145)
Total Bilirubin: 0.8 mg/dL (ref 0.2–1.2)
Total Protein: 7.6 g/dL (ref 6.0–8.3)

## 2020-12-01 LAB — LIPID PANEL
Cholesterol: 156 mg/dL (ref 0–200)
HDL: 42.5 mg/dL (ref >39.00)
LDL Cholesterol: 100 mg/dL — ABNORMAL HIGH (ref 0–99)
NonHDL: 113.42
Total CHOL/HDL Ratio: 4
Triglycerides: 68 mg/dL (ref 0.0–149.0)
VLDL: 13.6 mg/dL (ref 0.0–40.0)

## 2020-12-01 LAB — VITAMIN B12: Vitamin B-12: 286 pg/mL (ref 211–911)

## 2020-12-01 LAB — HEMOGLOBIN A1C: Hgb A1c MFr Bld: 5.2 % (ref 4.6–6.5)

## 2020-12-01 LAB — TSH: TSH: 1.09 u[IU]/mL (ref 0.35–5.50)

## 2020-12-01 LAB — VITAMIN D 25 HYDROXY (VIT D DEFICIENCY, FRACTURES): VITD: 29.87 ng/mL — ABNORMAL LOW (ref 30.00–100.00)

## 2020-12-01 MED ORDER — PREDNISONE 10 MG (21) PO TBPK: ORAL_TABLET | ORAL | 0 refills | Status: DC

## 2020-12-01 MED ORDER — VITAMIN D (ERGOCALCIFEROL) 1.25 MG (50000 UNIT) PO CAPS: 50000.0000 [IU] | ORAL_CAPSULE | ORAL | 0 refills | Status: AC

## 2020-12-01 NOTE — Progress Notes (Signed)
Established Patient Office Visit     This visit occurred during the SARS-CoV-2 public health emergency.  Safety protocols were in place, including screening questions prior to the visit, additional usage of staff PPE, and extensive cleaning of exam room while observing appropriate contact time as indicated for disinfecting solutions.    CC/Reason for Visit: Annual preventive exam  HPI: Adam Terry is a 45 y.o. male who is coming in today for the above mentioned reasons. Past Medical History is significant for: Asthma, hypertension, ADHD and vitamin D deficiency.  He has routine eye and dental care.  He exercises by walking.  He is overdue for his flu vaccine and his COVID booster.  He had his colonoscopy earlier this year.  He recently had C-spine surgery to repair 2 ruptured disks and has done quite well.  He is requesting a prednisone refill to have on standby for when he has an asthma flareup as last time he needed to make a visit for this.   Past Medical/Surgical History: Past Medical History:  Diagnosis Date   ADHD (attention deficit hyperactivity disorder)    ALLERGIC RHINITIS 09/17/2007   Allergy    Asthma    Cataract    forming    Chronic kidney disease    kidney stones    Hypertension    NEPHROLITHIASIS, HX OF 10/20/2007   PONV (postoperative nausea and vomiting)    with anesthesia gas    STONE, URINARY CALCULUS,UNSPEC. 09/17/2007    Past Surgical History:  Procedure Laterality Date   patch to ear drum     TONSILLECTOMY     TYMPANOSTOMY TUBE PLACEMENT     x 13     Social History:  reports that he has never smoked. He has never used smokeless tobacco. He reports current alcohol use. He reports that he does not use drugs.  Allergies: Allergies  Allergen Reactions   Penicillins     REACTION: hives, throat closes   Sulfamethoxazole     REACTION: hives, throat closes    Family History:  Family History  Problem Relation Age of Onset   Urolithiasis  Mother    Leukemia Maternal Uncle    Heart disease Maternal Grandmother    Breast cancer Maternal Grandmother    Lung cancer Maternal Grandmother    Colon cancer Neg Hx    Colon polyps Neg Hx    Esophageal cancer Neg Hx    Stomach cancer Neg Hx    Rectal cancer Neg Hx      Current Outpatient Medications:    albuterol (PROVENTIL HFA;VENTOLIN HFA) 108 (90 Base) MCG/ACT inhaler, Inhale 2 puffs into the lungs every 6 (six) hours as needed for wheezing or shortness of breath., Disp: 1 Inhaler, Rfl: 0   amLODipine-valsartan (EXFORGE) 5-320 MG tablet, Take 1 tablet by mouth daily., Disp: 90 tablet, Rfl: 1   amphetamine-dextroamphetamine (ADDERALL XR) 20 MG 24 hr capsule, Take 1 capsule (20 mg total) by mouth daily., Disp: 30 capsule, Rfl: 0   amphetamine-dextroamphetamine (ADDERALL XR) 20 MG 24 hr capsule, Take 1 capsule (20 mg total) by mouth every morning., Disp: 30 capsule, Rfl: 0   amphetamine-dextroamphetamine (ADDERALL XR) 20 MG 24 hr capsule, Take 1 capsule (20 mg total) by mouth daily., Disp: 30 capsule, Rfl: 0   aspirin-acetaminophen-caffeine (EXCEDRIN MIGRAINE) 250-250-65 MG per tablet, Take 1 tablet by mouth every 6 (six) hours as needed for headache., Disp: , Rfl:    azelastine (ASTELIN) 0.1 % nasal spray, Place 2  sprays into both nostrils 2 (two) times daily. Use in each nostril as directed, Disp: 30 mL, Rfl: 12   budesonide-formoterol (SYMBICORT) 160-4.5 MCG/ACT inhaler, Inhale 2 puffs into the lungs 2 (two) times daily., Disp: 1 Inhaler, Rfl: 3   EPINEPHrine 0.3 mg/0.3 mL IJ SOAJ injection, See admin instructions., Disp: , Rfl:    fluticasone (FLONASE SENSIMIST) 27.5 MCG/SPRAY nasal spray, 2 sprays each nostril, Disp: , Rfl:    ketotifen (ZADITOR) 0.025 % ophthalmic solution, 1 drop into affected eye, Disp: , Rfl:    levocetirizine (XYZAL) 5 MG tablet, Take 1 tablet (5 mg total) by mouth daily., Disp: 60 tablet, Rfl: 4   montelukast (SINGULAIR) 10 MG tablet, Take 1 tablet (10 mg  total) by mouth at bedtime., Disp: 30 tablet, Rfl: 6   predniSONE (STERAPRED UNI-PAK 21 TAB) 10 MG (21) TBPK tablet, Take as directed, Disp: 21 tablet, Rfl: 0   Spacer/Aero-Holding Chambers (AEROCHAMBER MAX W/FLOW-VU) MISC, See admin instructions., Disp: , Rfl:   Review of Systems:  Constitutional: Denies fever, chills, diaphoresis, appetite change and fatigue.  HEENT: Denies photophobia, eye pain, redness, hearing loss, ear pain, congestion, sore throat, rhinorrhea, sneezing, mouth sores, trouble swallowing, neck pain, neck stiffness and tinnitus.   Respiratory: Denies SOB, DOE, cough, chest tightness,  and wheezing.   Cardiovascular: Denies chest pain, palpitations and leg swelling.  Gastrointestinal: Denies nausea, vomiting, abdominal pain, diarrhea, constipation, blood in stool and abdominal distention.  Genitourinary: Denies dysuria, urgency, frequency, hematuria, flank pain and difficulty urinating.  Endocrine: Denies: hot or cold intolerance, sweats, changes in hair or nails, polyuria, polydipsia. Musculoskeletal: Denies myalgias, back pain, joint swelling, arthralgias and gait problem.  Skin: Denies pallor, rash and wound.  Neurological: Denies dizziness, seizures, syncope, weakness, light-headedness, numbness and headaches.  Hematological: Denies adenopathy. Easy bruising, personal or family bleeding history  Psychiatric/Behavioral: Denies suicidal ideation, mood changes, confusion, nervousness, sleep disturbance and agitation    Physical Exam: Vitals:   12/01/20 1032  BP: 120/80  Pulse: 82  Temp: 98.1 F (36.7 C)  TempSrc: Oral  SpO2: 100%  Weight: 186 lb 9.6 oz (84.6 kg)  Height: 5\' 8"  (1.727 m)    Body mass index is 28.37 kg/m.   Constitutional: NAD, calm, comfortable Eyes: PERRL, lids and conjunctivae normal, wears corrective lenses ENMT: Mucous membranes are moist. Posterior pharynx clear of any exudate or lesions. Normal dentition. Tympanic membrane is pearly  white, no erythema or bulging. Neck: normal, supple, no masses, no thyromegaly, well-healed incision from recent C-spine surgery noted to right anterior neck. Respiratory: clear to auscultation bilaterally, no wheezing, no crackles. Normal respiratory effort. No accessory muscle use.  Cardiovascular: Regular rate and rhythm, no murmurs / rubs / gallops. No extremity edema. 2+ pedal pulses. No carotid bruits.  Abdomen: no tenderness, no masses palpated. No hepatosplenomegaly. Bowel sounds positive.  Musculoskeletal: no clubbing / cyanosis. No joint deformity upper and lower extremities. Good ROM, no contractures. Normal muscle tone.  Skin: no rashes, lesions, ulcers. No induration Neurologic: CN 2-12 grossly intact. Sensation intact, DTR normal. Strength 5/5 in all 4.  Psychiatric: Normal judgment and insight. Alert and oriented x 3. Normal mood.    Impression and Plan:  Encounter for preventive health examination -Recommend routine eye and dental care. -Immunizations: Flu vaccine today, advised COVID booster at pharmacy, Tdap up-to-date. -Healthy lifestyle discussed in detail. -Labs to be updated today. -Colon cancer screening: January 2022 -Breast cancer screening: Not applicable -Cervical cancer screening: Not applicable -Lung cancer screening: Not applicable -Prostate  cancer screening: PSA today -DEXA: Not applicable  Vitamin D deficiency  - Plan: VITAMIN D 25 Hydroxy (Vit-D Deficiency, Fractures)  Moderate persistent asthma without complication  - Plan: predniSONE (STERAPRED UNI-PAK 21 TAB) 10 MG (21) TBPK tablet  Attention deficit hyperactivity disorder (ADHD), combined type -Not due for Adderall refills today.  Essential hypertension  -Well-controlled.  Need for influenza vaccination -Flu vaccine administered today.     Lelon Frohlich, MD [facility] at [facility]

## 2020-12-01 NOTE — Addendum Note (Signed)
Addended by: Westley Hummer B on: 12/01/2020 04:41 PM   Modules accepted: Orders

## 2020-12-05 ENCOUNTER — Other Ambulatory Visit: Payer: Self-pay | Admitting: Internal Medicine

## 2020-12-05 DIAGNOSIS — E559 Vitamin D deficiency, unspecified: Secondary | ICD-10-CM

## 2020-12-11 DIAGNOSIS — J3089 Other allergic rhinitis: Secondary | ICD-10-CM | POA: Diagnosis not present

## 2020-12-11 DIAGNOSIS — J301 Allergic rhinitis due to pollen: Secondary | ICD-10-CM | POA: Diagnosis not present

## 2020-12-11 DIAGNOSIS — J3081 Allergic rhinitis due to animal (cat) (dog) hair and dander: Secondary | ICD-10-CM | POA: Diagnosis not present

## 2020-12-15 ENCOUNTER — Telehealth: Payer: BC Managed Care – PPO | Admitting: Physician Assistant

## 2020-12-15 DIAGNOSIS — Z20828 Contact with and (suspected) exposure to other viral communicable diseases: Secondary | ICD-10-CM | POA: Diagnosis not present

## 2020-12-15 MED ORDER — OSELTAMIVIR PHOSPHATE 75 MG PO CAPS: 75.0000 mg | ORAL_CAPSULE | Freq: Every day | ORAL | 0 refills | Status: DC

## 2020-12-15 NOTE — Patient Instructions (Signed)
Adam Terry, thank you for joining Mar Daring, PA-C for today's virtual visit.  While this provider is not your primary care provider (PCP), if your PCP is located in our provider database this encounter information will be shared with them immediately following your visit.  Consent: (Patient) Adam Terry provided verbal consent for this virtual visit at the beginning of the encounter.  Current Medications:  Current Outpatient Medications:    oseltamivir (TAMIFLU) 75 MG capsule, Take 1 capsule (75 mg total) by mouth daily., Disp: 10 capsule, Rfl: 0   albuterol (PROVENTIL HFA;VENTOLIN HFA) 108 (90 Base) MCG/ACT inhaler, Inhale 2 puffs into the lungs every 6 (six) hours as needed for wheezing or shortness of breath., Disp: 1 Inhaler, Rfl: 0   amLODipine-valsartan (EXFORGE) 5-320 MG tablet, Take 1 tablet by mouth daily., Disp: 90 tablet, Rfl: 1   amphetamine-dextroamphetamine (ADDERALL XR) 20 MG 24 hr capsule, Take 1 capsule (20 mg total) by mouth daily., Disp: 30 capsule, Rfl: 0   amphetamine-dextroamphetamine (ADDERALL XR) 20 MG 24 hr capsule, Take 1 capsule (20 mg total) by mouth every morning., Disp: 30 capsule, Rfl: 0   amphetamine-dextroamphetamine (ADDERALL XR) 20 MG 24 hr capsule, Take 1 capsule (20 mg total) by mouth daily., Disp: 30 capsule, Rfl: 0   aspirin-acetaminophen-caffeine (EXCEDRIN MIGRAINE) 250-250-65 MG per tablet, Take 1 tablet by mouth every 6 (six) hours as needed for headache., Disp: , Rfl:    azelastine (ASTELIN) 0.1 % nasal spray, Place 2 sprays into both nostrils 2 (two) times daily. Use in each nostril as directed, Disp: 30 mL, Rfl: 12   budesonide-formoterol (SYMBICORT) 160-4.5 MCG/ACT inhaler, Inhale 2 puffs into the lungs 2 (two) times daily., Disp: 1 Inhaler, Rfl: 3   EPINEPHrine 0.3 mg/0.3 mL IJ SOAJ injection, See admin instructions., Disp: , Rfl:    fluticasone (FLONASE SENSIMIST) 27.5 MCG/SPRAY nasal spray, 2 sprays each nostril, Disp: , Rfl:     ketotifen (ZADITOR) 0.025 % ophthalmic solution, 1 drop into affected eye, Disp: , Rfl:    levocetirizine (XYZAL) 5 MG tablet, Take 1 tablet (5 mg total) by mouth daily., Disp: 60 tablet, Rfl: 4   montelukast (SINGULAIR) 10 MG tablet, Take 1 tablet (10 mg total) by mouth at bedtime., Disp: 30 tablet, Rfl: 6   predniSONE (STERAPRED UNI-PAK 21 TAB) 10 MG (21) TBPK tablet, Take as directed, Disp: 21 tablet, Rfl: 0   Spacer/Aero-Holding Chambers (AEROCHAMBER MAX W/FLOW-VU) MISC, See admin instructions., Disp: , Rfl:    Vitamin D, Ergocalciferol, (DRISDOL) 1.25 MG (50000 UNIT) CAPS capsule, Take 1 capsule (50,000 Units total) by mouth every 7 (seven) days for 12 doses., Disp: 12 capsule, Rfl: 0   Medications ordered in this encounter:  Meds ordered this encounter  Medications   oseltamivir (TAMIFLU) 75 MG capsule    Sig: Take 1 capsule (75 mg total) by mouth daily.    Dispense:  10 capsule    Refill:  0    Order Specific Question:   Supervising Provider    Answer:   Sabra Heck, [place]     *If you need refills on other medications prior to your next appointment, please contact your pharmacy*  Follow-Up: Call back or seek an in-person evaluation if the symptoms worsen or if the condition fails to improve as anticipated.  Other Instructions Preventing Influenza, Adult Influenza, also known as the flu, is an infection caused by a virus. The flu mainly affects the nose, throat, and lungs (respiratory system). This infection causes common  cold symptoms, as well as a high fever and body aches. The flu spreads easily from person to person (is contagious). The flu is most common from December through March. This period of time is called flu season. You can catch the flu virus by: Breathing in droplets from an infected person's cough or sneeze. Touching something that recently got the virus on it (is contaminated) and then touching your mouth, nose, or eyes. How can this condition affect me? If  you get the flu, your friends, family, and co-workers are also at risk of getting it because the flu spreads easily to others. Having the flu can lead to complications, such as pneumonia, ear infection, and sinus infection. The flu also can be life-threatening, especially for babies, people older than age 23, and people who have serious long-term diseases. You can protect yourself and other people by: Keeping your body's defense system (immune system) strong. Getting a flu shot, or influenza vaccination, each year. Practicing good health habits. What can increase my risk? The following factors may make you more likely to get the flu: Not washing or sanitizing your hands often. Having close contact with many people during cold and flu season. Touching your mouth, eyes, or nose without first washing or sanitizing your hands. Not getting a flu shot each year. Working in health care. You may be at risk for more serious flu symptoms if: You are older than age 41. You are pregnant. Your immune system is weak. You have a condition that makes the flu worse or life-threatening. You have severe obesity. What actions can I take to prevent this? Lifestyle You can lower your risk of getting the flu by keeping your immune system in good shape. Do this by: Eating a healthy diet. Drinking plenty of fluids. Drink enough fluid to keep your urine pale yellow. Getting enough sleep. Exercising regularly. Do not use any products that contain nicotine or tobacco. These products include cigarettes, chewing tobacco, and vaping devices, such as e-cigarettes. If you need help quitting, ask your health care provider. Medicines Getting a flu shot every year can lower your risk of getting the flu. This is the best way to prevent the flu. A flu shot is recommended for everyone age 46 months or older. It is best to get a flu shot in the fall, as soon as it is available. But getting a flu shot during winter or spring  instead is still a good idea. Flu season can last into early spring. Preventing the flu through vaccination means getting a new flu shot every year. This is because the flu virus changes slightly (mutates) from one year to the next. The flu shot does not completely protect you from all flu virus mutations. If you get the flu after you get the shot, the shot can make your illness milder and go away sooner. It also can prevent dangerous complications of the flu. If you are pregnant, you can and should get a flu shot. Check with your health care provider before getting a flu shot if you have had a reaction to the shot in the past or if you are allergic to eggs. Sometimes the vaccine is available as a nasal spray. In some years, the nasal spray has not been as effective against the flu virus. Check with your health care provider if you have questions about this. Most people recover from the flu by resting at home and drinking plenty of fluids. However, a prescription antiviral medicine may reduce your  flu symptoms and may make your flu go away sooner. This medicine must be started within a few days of getting flu symptoms. Antiviral medicine may be prescribed for people who are at risk for more serious flu symptoms. Talk with your health care provider about whether you need an antiviral medicine.  General information   You can also lower your risk of getting the flu by practicing good health habits. This is especially important during flu season. Avoid contact with people who are sick with flu or cold symptoms. Wash your hands with soap and water often and for at least 20 seconds. If soap and water are not available, use alcohol-based hand sanitizer. Avoid touching your hands to your face, especially when you have not washed your hands recently. Use a cleanser that kills germs (a disinfectant) to clean surfaces at home and at work that may have the flu virus on them. If you do get the flu, avoid  spreading it to others by: Staying home until your symptoms, including your fever, have been gone for at least 24 hours. Covering your mouth and nose when you cough or sneeze. Avoiding close contact with others, especially babies and older people.  Where to find more information Centers for Disease Control and Prevention: http://www.wolf.info/ American Academy of Family Physicians: www.familydoctor.org Contact a health care provider if: You have the flu and you develop new symptoms. You have diarrhea. You have a fever. Your cough gets worse, or you produce more mucus. Get help right away if you: Have trouble breathing. Have chest pain. These symptoms may represent a serious problem that is an emergency. Do not wait to see if the symptoms will go away. Get medical help right away. Call your local emergency services (911 in the [place] Do not drive yourself to the hospital. Summary The best way to prevent the flu is to get a flu shot (influenza vaccination) every year in the fall. Even if you get the flu after you have received the yearly flu shot, your flu may be milder and go away sooner because of your flu shot. If you get the flu, antiviral medicines that are started within a few days of flu symptoms may reduce your symptoms and reduce how long the flu lasts. You can also help prevent the flu by practicing good health habits. This information is not intended to replace advice given to you by your health care provider. Make sure you discuss any questions you have with your health care provider. Document Revised: 08/20/2019 Document Reviewed: 08/20/2019 Elsevier Patient Education  2022 [employer].    If you have been instructed to have an in-person evaluation today at a local Urgent Care facility, please use the link below. It will take you to a list of all of our available [facility], including address, phone number and hours of operation. Please do not delay care.  [facility]  If you or a family member do not have a primary care provider, use the link below to schedule a visit and establish care. When you choose a [facility] primary care physician or advanced practice provider, you gain a long-term partner in health. Find a Primary Care Provider  Learn more about [facility] in-office and virtual care options: [facility]

## 2020-12-15 NOTE — Progress Notes (Signed)
Virtual Visit Consent   Adam Terry, you are scheduled for a virtual visit with a [facility] provider today.     Just as with appointments in the office, your consent must be obtained to participate.  Your consent will be active for this visit and any virtual visit you may have with one of our providers in the next 365 days.     If you have a MyChart account, a copy of this consent can be sent to you electronically.  All virtual visits are billed to your insurance company just like a traditional visit in the office.    As this is a virtual visit, video technology does not allow for your provider to perform a traditional examination.  This may limit your provider's ability to fully assess your condition.  If your provider identifies any concerns that need to be evaluated in person or the need to arrange testing (such as labs, EKG, etc.), we will make arrangements to do so.     Although advances in technology are sophisticated, we cannot ensure that it will always work on either your end or our end.  If the connection with a video visit is poor, the visit may have to be switched to a telephone visit.  With either a video or telephone visit, we are not always able to ensure that we have a secure connection.     I need to obtain your verbal consent now.   Are you willing to proceed with your visit today?    Adam Terry has provided verbal consent on 12/15/2020 for a virtual visit (video or telephone).   Adam Daring, PA-C   Date: 12/15/2020 6:35 PM   Virtual Visit via Video Note   I, Adam Terry, connected with  Adam Terry  ([id number], September 08, 1975) on 12/15/20 at  6:30 PM EST by a video-enabled telemedicine application and verified that I am speaking with the correct person using two identifiers.  Location: Patient: Virtual Visit Location Patient: Home Provider: Virtual Visit Location Provider: Home Office   I discussed the limitations of evaluation and  management by telemedicine and the availability of in person appointments. The patient expressed understanding and agreed to proceed.    History of Present Illness: Adam Terry is a 45 y.o. who identifies as a male who was assigned male at birth, and is being seen today for flu exposure. Daughter diagnosed with flu today. Unsure what type. He recently had a disc fusion in the cervical spine and would like to try to avoid illness if possible.   Problems:  Patient Active Problem List   Diagnosis Date Noted   Vitamin D deficiency 11/06/2018   Obesity (BMI 30.0-34.9) 07/31/2018   Essential hypertension 10/08/2016   Asthma with acute exacerbation 03/22/2015   ADHD (attention deficit hyperactivity disorder) 09/13/2010   NEPHROLITHIASIS, HX OF 10/20/2007   Allergic rhinitis 09/17/2007   STONE, URINARY CALCULUS,UNSPEC. 09/17/2007    Allergies:  Allergies  Allergen Reactions   Penicillins     REACTION: hives, throat closes   Sulfamethoxazole     REACTION: hives, throat closes   Medications:  Current Outpatient Medications:    oseltamivir (TAMIFLU) 75 MG capsule, Take 1 capsule (75 mg total) by mouth daily., Disp: 10 capsule, Rfl: 0   albuterol (PROVENTIL HFA;VENTOLIN HFA) 108 (90 Base) MCG/ACT inhaler, Inhale 2 puffs into the lungs every 6 (six) hours as needed for wheezing or shortness of breath., Disp: 1 Inhaler, Rfl: 0  amLODipine-valsartan (EXFORGE) 5-320 MG tablet, Take 1 tablet by mouth daily., Disp: 90 tablet, Rfl: 1   amphetamine-dextroamphetamine (ADDERALL XR) 20 MG 24 hr capsule, Take 1 capsule (20 mg total) by mouth daily., Disp: 30 capsule, Rfl: 0   amphetamine-dextroamphetamine (ADDERALL XR) 20 MG 24 hr capsule, Take 1 capsule (20 mg total) by mouth every morning., Disp: 30 capsule, Rfl: 0   amphetamine-dextroamphetamine (ADDERALL XR) 20 MG 24 hr capsule, Take 1 capsule (20 mg total) by mouth daily., Disp: 30 capsule, Rfl: 0   aspirin-acetaminophen-caffeine (EXCEDRIN  MIGRAINE) 250-250-65 MG per tablet, Take 1 tablet by mouth every 6 (six) hours as needed for headache., Disp: , Rfl:    azelastine (ASTELIN) 0.1 % nasal spray, Place 2 sprays into both nostrils 2 (two) times daily. Use in each nostril as directed, Disp: 30 mL, Rfl: 12   budesonide-formoterol (SYMBICORT) 160-4.5 MCG/ACT inhaler, Inhale 2 puffs into the lungs 2 (two) times daily., Disp: 1 Inhaler, Rfl: 3   EPINEPHrine 0.3 mg/0.3 mL IJ SOAJ injection, See admin instructions., Disp: , Rfl:    fluticasone (FLONASE SENSIMIST) 27.5 MCG/SPRAY nasal spray, 2 sprays each nostril, Disp: , Rfl:    ketotifen (ZADITOR) 0.025 % ophthalmic solution, 1 drop into affected eye, Disp: , Rfl:    levocetirizine (XYZAL) 5 MG tablet, Take 1 tablet (5 mg total) by mouth daily., Disp: 60 tablet, Rfl: 4   montelukast (SINGULAIR) 10 MG tablet, Take 1 tablet (10 mg total) by mouth at bedtime., Disp: 30 tablet, Rfl: 6   predniSONE (STERAPRED UNI-PAK 21 TAB) 10 MG (21) TBPK tablet, Take as directed, Disp: 21 tablet, Rfl: 0   Spacer/Aero-Holding Chambers (AEROCHAMBER MAX W/FLOW-VU) MISC, See admin instructions., Disp: , Rfl:    Vitamin D, Ergocalciferol, (DRISDOL) 1.25 MG (50000 UNIT) CAPS capsule, Take 1 capsule (50,000 Units total) by mouth every 7 (seven) days for 12 doses., Disp: 12 capsule, Rfl: 0  Observations/Objective: Patient is well-developed, well-nourished in no acute distress.  Resting comfortably at home.  Head is normocephalic, atraumatic.  No labored breathing.  Speech is clear and coherent with logical content.  Patient is alert and oriented at baseline.    Assessment and Plan: 1. Exposure to the flu - oseltamivir (TAMIFLU) 75 MG capsule; Take 1 capsule (75 mg total) by mouth daily.  Dispense: 10 capsule; Refill: 0  - Advised that if daughter was diagnosed with Flu A Tamiflu would most likely not work as prophylaxis - Side effects discussed - Patient would still prefer to start Tamilfu daily for  prophylaxis - Still practice good hand hygiene and disinfection standards - Seek further care if symptoms develop  Follow Up Instructions: I discussed the assessment and treatment plan with the patient. The patient was provided an opportunity to ask questions and all were answered. The patient agreed with the plan and demonstrated an understanding of the instructions.  A copy of instructions were sent to the patient via MyChart unless otherwise noted below.    The patient was advised to call back or seek an in-person evaluation if the symptoms worsen or if the condition fails to improve as anticipated.  Time:  I spent 10 minutes with the patient via telehealth technology discussing the above problems/concerns.    Adam Daring, PA-C

## 2020-12-25 ENCOUNTER — Other Ambulatory Visit: Payer: Self-pay | Admitting: Internal Medicine

## 2020-12-25 ENCOUNTER — Encounter: Payer: Self-pay | Admitting: Internal Medicine

## 2020-12-25 DIAGNOSIS — F902 Attention-deficit hyperactivity disorder, combined type: Secondary | ICD-10-CM

## 2020-12-26 MED ORDER — AMPHETAMINE-DEXTROAMPHET ER 20 MG PO CP24: 20.0000 mg | ORAL_CAPSULE | Freq: Every morning | ORAL | 0 refills | Status: DC

## 2020-12-26 MED ORDER — AMPHETAMINE-DEXTROAMPHET ER 20 MG PO CP24: 20.0000 mg | ORAL_CAPSULE | Freq: Every day | ORAL | 0 refills | Status: DC

## 2021-01-04 DIAGNOSIS — J3089 Other allergic rhinitis: Secondary | ICD-10-CM | POA: Diagnosis not present

## 2021-01-04 DIAGNOSIS — J301 Allergic rhinitis due to pollen: Secondary | ICD-10-CM | POA: Diagnosis not present

## 2021-01-04 DIAGNOSIS — J3081 Allergic rhinitis due to animal (cat) (dog) hair and dander: Secondary | ICD-10-CM | POA: Diagnosis not present

## 2021-02-02 DIAGNOSIS — J301 Allergic rhinitis due to pollen: Secondary | ICD-10-CM | POA: Diagnosis not present

## 2021-02-02 DIAGNOSIS — J3089 Other allergic rhinitis: Secondary | ICD-10-CM | POA: Diagnosis not present

## 2021-02-02 DIAGNOSIS — J3081 Allergic rhinitis due to animal (cat) (dog) hair and dander: Secondary | ICD-10-CM | POA: Diagnosis not present

## 2021-03-02 DIAGNOSIS — J3081 Allergic rhinitis due to animal (cat) (dog) hair and dander: Secondary | ICD-10-CM | POA: Diagnosis not present

## 2021-03-02 DIAGNOSIS — J301 Allergic rhinitis due to pollen: Secondary | ICD-10-CM | POA: Diagnosis not present

## 2021-03-02 DIAGNOSIS — J3089 Other allergic rhinitis: Secondary | ICD-10-CM | POA: Diagnosis not present

## 2021-03-30 ENCOUNTER — Other Ambulatory Visit: Payer: Self-pay | Admitting: Internal Medicine

## 2021-03-30 DIAGNOSIS — J3089 Other allergic rhinitis: Secondary | ICD-10-CM | POA: Diagnosis not present

## 2021-03-30 DIAGNOSIS — J301 Allergic rhinitis due to pollen: Secondary | ICD-10-CM | POA: Diagnosis not present

## 2021-03-30 DIAGNOSIS — J3081 Allergic rhinitis due to animal (cat) (dog) hair and dander: Secondary | ICD-10-CM | POA: Diagnosis not present

## 2021-03-30 DIAGNOSIS — I10 Essential (primary) hypertension: Secondary | ICD-10-CM

## 2021-04-10 DIAGNOSIS — J301 Allergic rhinitis due to pollen: Secondary | ICD-10-CM | POA: Diagnosis not present

## 2021-04-10 DIAGNOSIS — J3081 Allergic rhinitis due to animal (cat) (dog) hair and dander: Secondary | ICD-10-CM | POA: Diagnosis not present

## 2021-04-11 DIAGNOSIS — J302 Other seasonal allergic rhinitis: Secondary | ICD-10-CM | POA: Diagnosis not present

## 2021-04-26 DIAGNOSIS — J301 Allergic rhinitis due to pollen: Secondary | ICD-10-CM | POA: Diagnosis not present

## 2021-04-26 DIAGNOSIS — J3081 Allergic rhinitis due to animal (cat) (dog) hair and dander: Secondary | ICD-10-CM | POA: Diagnosis not present

## 2021-04-26 DIAGNOSIS — J3089 Other allergic rhinitis: Secondary | ICD-10-CM | POA: Diagnosis not present

## 2021-05-14 ENCOUNTER — Encounter: Payer: Self-pay | Admitting: Internal Medicine

## 2021-05-14 ENCOUNTER — Encounter: Payer: Self-pay | Admitting: Family Medicine

## 2021-05-14 ENCOUNTER — Telehealth (INDEPENDENT_AMBULATORY_CARE_PROVIDER_SITE_OTHER): Payer: BC Managed Care – PPO | Admitting: Family Medicine

## 2021-05-14 DIAGNOSIS — U071 COVID-19: Secondary | ICD-10-CM

## 2021-05-14 MED ORDER — HYDROCODONE BIT-HOMATROP MBR 5-1.5 MG/5ML PO SOLN: 5.0000 mL | ORAL | 0 refills | Status: DC | PRN

## 2021-05-14 MED ORDER — NIRMATRELVIR/RITONAVIR (PAXLOVID)TABLET: 3.0000 | ORAL_TABLET | Freq: Two times a day (BID) | ORAL | 0 refills | Status: AC

## 2021-05-14 NOTE — Progress Notes (Signed)
? ?Subjective:  ? ? Patient ID: Adam Terry, male    DOB: 14-Oct-1975, 46 y.o.   MRN: [id number] ? ?HPI ?Here for a Covid-919 infection. He and his wife and child all began having stuffy heads, ST, and dry coughs about 4 days ago. They all tested positive for the Covid virus last night. He denies any fever. No more SOB than usual ( he has an albuterol inhaler if needed).  ?Virtual Visit via Video Note ? ?I connected with the patient on 05/14/21 at  1:30 PM EDT by a video enabled telemedicine application and verified that I am speaking with the correct person using two identifiers. ? Location patient: home ?Location provider:work or home office ?Persons participating in the virtual visit: patient, provider ? ?I discussed the limitations of evaluation and management by telemedicine and the availability of in person appointments. The patient expressed understanding and agreed to proceed. ? ? ?HPI: ? ? ? ?ROS: See pertinent positives and negatives per HPI. ? ?Past Medical History:  ?Diagnosis Date  ? ADHD (attention deficit hyperactivity disorder)   ? ALLERGIC RHINITIS 09/17/2007  ? Allergy   ? Asthma   ? Cataract   ? forming   ? Chronic kidney disease   ? kidney stones   ? Hypertension   ? NEPHROLITHIASIS, HX OF 10/20/2007  ? PONV (postoperative nausea and vomiting)   ? with anesthesia gas   ? STONE, URINARY CALCULUS,UNSPEC. 09/17/2007  ? ? ?Past Surgical History:  ?Procedure Laterality Date  ? patch to ear drum    ? TONSILLECTOMY    ? TYMPANOSTOMY TUBE PLACEMENT    ? x 13   ? ? ?Family History  ?Problem Relation Age of Onset  ? Urolithiasis Mother   ? Leukemia Maternal Uncle   ? Heart disease Maternal Grandmother   ? Breast cancer Maternal Grandmother   ? Lung cancer Maternal Grandmother   ? Colon cancer Neg Hx   ? Colon polyps Neg Hx   ? Esophageal cancer Neg Hx   ? Stomach cancer Neg Hx   ? Rectal cancer Neg Hx   ? ? ? ?Current Outpatient Medications:  ?  albuterol (PROVENTIL HFA;VENTOLIN HFA) 108 (90 Base) MCG/ACT  inhaler, Inhale 2 puffs into the lungs every 6 (six) hours as needed for wheezing or shortness of breath., Disp: 1 Inhaler, Rfl: 0 ?  amLODipine-valsartan (EXFORGE) 5-320 MG tablet, Take 1 tablet by mouth daily., Disp: 30 tablet, Rfl: 5 ?  amphetamine-dextroamphetamine (ADDERALL XR) 20 MG 24 hr capsule, Take 1 capsule (20 mg total) by mouth daily., Disp: 30 capsule, Rfl: 0 ?  amphetamine-dextroamphetamine (ADDERALL XR) 20 MG 24 hr capsule, Take 1 capsule (20 mg total) by mouth daily., Disp: 30 capsule, Rfl: 0 ?  amphetamine-dextroamphetamine (ADDERALL XR) 20 MG 24 hr capsule, Take 1 capsule (20 mg total) by mouth every morning., Disp: 30 capsule, Rfl: 0 ?  aspirin-acetaminophen-caffeine (EXCEDRIN MIGRAINE) 681-157-26 MG per tablet, Take 1 tablet by mouth every 6 (six) hours as needed for headache., Disp: , Rfl:  ?  azelastine (ASTELIN) 0.1 % nasal spray, Place 2 sprays into both nostrils 2 (two) times daily. Use in each nostril as directed, Disp: 30 mL, Rfl: 12 ?  budesonide-formoterol (SYMBICORT) 160-4.5 MCG/ACT inhaler, Inhale 2 puffs into the lungs 2 (two) times daily., Disp: 1 Inhaler, Rfl: 3 ?  EPINEPHrine 0.3 mg/0.3 mL IJ SOAJ injection, See admin instructions., Disp: , Rfl:  ?  fluticasone (FLONASE SENSIMIST) 27.5 MCG/SPRAY nasal spray, 2 sprays each nostril,  Disp: , Rfl:  ?  HYDROcodone bit-homatropine (HYCODAN) 5-1.5 MG/5ML syrup, Take 5 mLs by mouth every 4 (four) hours as needed., Disp: 240 mL, Rfl: 0 ?  ketotifen (ZADITOR) 0.025 % ophthalmic solution, 1 drop into affected eye, Disp: , Rfl:  ?  levocetirizine (XYZAL) 5 MG tablet, Take 1 tablet (5 mg total) by mouth daily., Disp: 60 tablet, Rfl: 4 ?  montelukast (SINGULAIR) 10 MG tablet, Take 1 tablet (10 mg total) by mouth at bedtime., Disp: 30 tablet, Rfl: 6 ?  nirmatrelvir/ritonavir EUA (PAXLOVID) 20 x 150 MG & 10 x '100MG'$  TABS, Take 3 tablets by mouth 2 (two) times daily for 5 days. (Take nirmatrelvir 150 mg two tablets twice daily for 5 days and  ritonavir 100 mg one tablet twice daily for 5 days) Patient GFR is 82, Disp: 30 tablet, Rfl: 0 ?  oseltamivir (TAMIFLU) 75 MG capsule, Take 1 capsule (75 mg total) by mouth daily., Disp: 10 capsule, Rfl: 0 ?  predniSONE (STERAPRED UNI-PAK 21 TAB) 10 MG (21) TBPK tablet, Take as directed, Disp: 21 tablet, Rfl: 0 ?  Spacer/Aero-Holding Chambers (AEROCHAMBER MAX W/FLOW-VU) MISC, See admin instructions., Disp: , Rfl:  ? ?EXAM: ? ?VITALS per patient if applicable: ? ?GENERAL: alert, oriented, appears well and in no acute distress ? ?HEENT: atraumatic, conjunttiva clear, no obvious abnormalities on inspection of external nose and ears ? ?NECK: normal movements of the head and neck ? ?LUNGS: on inspection no signs of respiratory distress, breathing rate appears normal, no obvious gross SOB, gasping or wheezing ? ?CV: no obvious cyanosis ? ?MS: moves all visible extremities without noticeable abnormality ? ?PSYCH/NEURO: pleasant and cooperative, no obvious depression or anxiety, speech and thought processing grossly intact ? ?ASSESSMENT AND PLAN: ?Covid infection. Treat with 5 days of Paxlovid. Recheck as needed.  ?Alysia Penna, MD ? ?Discussed the following assessment and plan: ? ?No diagnosis found. ? ? ?  ?I discussed the assessment and treatment plan with the patient. The patient was provided an opportunity to ask questions and all were answered. The patient agreed with the plan and demonstrated an understanding of the instructions. ?  ?The patient was advised to call back or seek an in-person evaluation if the symptoms worsen or if the condition fails to improve as anticipated. ? ?  ? ?Review of Systems ? ?   ?Objective:  ? Physical Exam ? ? ? ? ?   ?Assessment & Plan:  ? ? ?

## 2021-05-25 DIAGNOSIS — J301 Allergic rhinitis due to pollen: Secondary | ICD-10-CM | POA: Diagnosis not present

## 2021-05-25 DIAGNOSIS — J3081 Allergic rhinitis due to animal (cat) (dog) hair and dander: Secondary | ICD-10-CM | POA: Diagnosis not present

## 2021-05-25 DIAGNOSIS — J3089 Other allergic rhinitis: Secondary | ICD-10-CM | POA: Diagnosis not present

## 2021-05-29 DIAGNOSIS — J3081 Allergic rhinitis due to animal (cat) (dog) hair and dander: Secondary | ICD-10-CM | POA: Diagnosis not present

## 2021-05-29 DIAGNOSIS — J301 Allergic rhinitis due to pollen: Secondary | ICD-10-CM | POA: Diagnosis not present

## 2021-05-29 DIAGNOSIS — J3089 Other allergic rhinitis: Secondary | ICD-10-CM | POA: Diagnosis not present

## 2021-06-01 DIAGNOSIS — J3089 Other allergic rhinitis: Secondary | ICD-10-CM | POA: Diagnosis not present

## 2021-06-01 DIAGNOSIS — J3081 Allergic rhinitis due to animal (cat) (dog) hair and dander: Secondary | ICD-10-CM | POA: Diagnosis not present

## 2021-06-01 DIAGNOSIS — J301 Allergic rhinitis due to pollen: Secondary | ICD-10-CM | POA: Diagnosis not present

## 2021-06-06 DIAGNOSIS — J3081 Allergic rhinitis due to animal (cat) (dog) hair and dander: Secondary | ICD-10-CM | POA: Diagnosis not present

## 2021-06-06 DIAGNOSIS — J301 Allergic rhinitis due to pollen: Secondary | ICD-10-CM | POA: Diagnosis not present

## 2021-06-06 DIAGNOSIS — J3089 Other allergic rhinitis: Secondary | ICD-10-CM | POA: Diagnosis not present

## 2021-06-13 DIAGNOSIS — J301 Allergic rhinitis due to pollen: Secondary | ICD-10-CM | POA: Diagnosis not present

## 2021-06-13 DIAGNOSIS — J3081 Allergic rhinitis due to animal (cat) (dog) hair and dander: Secondary | ICD-10-CM | POA: Diagnosis not present

## 2021-06-13 DIAGNOSIS — J3089 Other allergic rhinitis: Secondary | ICD-10-CM | POA: Diagnosis not present

## 2021-06-15 DIAGNOSIS — J301 Allergic rhinitis due to pollen: Secondary | ICD-10-CM | POA: Diagnosis not present

## 2021-06-15 DIAGNOSIS — J3089 Other allergic rhinitis: Secondary | ICD-10-CM | POA: Diagnosis not present

## 2021-06-15 DIAGNOSIS — J3081 Allergic rhinitis due to animal (cat) (dog) hair and dander: Secondary | ICD-10-CM | POA: Diagnosis not present

## 2021-06-20 ENCOUNTER — Encounter: Payer: Self-pay | Admitting: Internal Medicine

## 2021-06-20 ENCOUNTER — Telehealth (INDEPENDENT_AMBULATORY_CARE_PROVIDER_SITE_OTHER): Payer: BC Managed Care – PPO | Admitting: Internal Medicine

## 2021-06-20 DIAGNOSIS — F902 Attention-deficit hyperactivity disorder, combined type: Secondary | ICD-10-CM | POA: Diagnosis not present

## 2021-06-20 MED ORDER — AMPHETAMINE-DEXTROAMPHET ER 20 MG PO CP24: 20.0000 mg | ORAL_CAPSULE | Freq: Every morning | ORAL | 0 refills | Status: DC

## 2021-06-20 MED ORDER — AMPHETAMINE-DEXTROAMPHET ER 20 MG PO CP24: 20.0000 mg | ORAL_CAPSULE | Freq: Every day | ORAL | 0 refills | Status: DC

## 2021-06-20 NOTE — Telephone Encounter (Signed)
Spoke with the patient and scheduled a virtual visit today at 3:30pm.

## 2021-06-20 NOTE — Progress Notes (Signed)
Virtual Visit via Video Note  I connected with Adam Terry on 06/20/21 at  3:30 PM EDT by a video enabled telemedicine application and verified that I am speaking with the correct person using two identifiers.  Location patient: home Location provider: work office Persons participating in the virtual visit: patient, provider  I discussed the limitations of evaluation and management by telemedicine and the availability of in person appointments. The patient expressed understanding and agreed to proceed.   HPI: This visit has been scheduled for the purpose of medication refills per contract.  He has a history of ADHD and has been on Adderall XR 20 mg daily for some time now.  He feels this dose works well for him and he has no untoward side effects.  He had an annual physical in November 2022.   ROS: Constitutional: Denies fever, chills, diaphoresis, appetite change and fatigue.  HEENT: Denies photophobia, eye pain, redness, hearing loss, ear pain, congestion, sore throat, rhinorrhea, sneezing, mouth sores, trouble swallowing, neck pain, neck stiffness and tinnitus.   Respiratory: Denies SOB, DOE, cough, chest tightness,  and wheezing.   Cardiovascular: Denies chest pain, palpitations and leg swelling.  Gastrointestinal: Denies nausea, vomiting, abdominal pain, diarrhea, constipation, blood in stool and abdominal distention.  Genitourinary: Denies dysuria, urgency, frequency, hematuria, flank pain and difficulty urinating.  Endocrine: Denies: hot or cold intolerance, sweats, changes in hair or nails, polyuria, polydipsia. Musculoskeletal: Denies myalgias, back pain, joint swelling, arthralgias and gait problem.  Skin: Denies pallor, rash and wound.  Neurological: Denies dizziness, seizures, syncope, weakness, light-headedness, numbness and headaches.  Hematological: Denies adenopathy. Easy bruising, personal or family bleeding history  Psychiatric/Behavioral: Denies suicidal  ideation, mood changes, confusion, nervousness, sleep disturbance and agitation   Past Medical History:  Diagnosis Date   ADHD (attention deficit hyperactivity disorder)    ALLERGIC RHINITIS 09/17/2007   Allergy    Asthma    Cataract    forming    Chronic kidney disease    kidney stones    Hypertension    NEPHROLITHIASIS, HX OF 10/20/2007   PONV (postoperative nausea and vomiting)    with anesthesia gas    STONE, URINARY CALCULUS,UNSPEC. 09/17/2007    Past Surgical History:  Procedure Laterality Date   patch to ear drum     TONSILLECTOMY     TYMPANOSTOMY TUBE PLACEMENT     x 13     Family History  Problem Relation Age of Onset   Urolithiasis Mother    Leukemia Maternal Uncle    Heart disease Maternal Grandmother    Breast cancer Maternal Grandmother    Lung cancer Maternal Grandmother    Colon cancer Neg Hx    Colon polyps Neg Hx    Esophageal cancer Neg Hx    Stomach cancer Neg Hx    Rectal cancer Neg Hx     SOCIAL HX:   reports that he has never smoked. He has never used smokeless tobacco. He reports current alcohol use. He reports that he does not use drugs.   Current Outpatient Medications:    albuterol (PROVENTIL HFA;VENTOLIN HFA) 108 (90 Base) MCG/ACT inhaler, Inhale 2 puffs into the lungs every 6 (six) hours as needed for wheezing or shortness of breath., Disp: 1 Inhaler, Rfl: 0   amLODipine-valsartan (EXFORGE) 5-320 MG tablet, Take 1 tablet by mouth daily., Disp: 30 tablet, Rfl: 5   aspirin-acetaminophen-caffeine (EXCEDRIN MIGRAINE) 818-299-37 MG per tablet, Take 1 tablet by mouth every 6 (six) hours as needed  for headache., Disp: , Rfl:    azelastine (ASTELIN) 0.1 % nasal spray, Place 2 sprays into both nostrils 2 (two) times daily. Use in each nostril as directed, Disp: 30 mL, Rfl: 12   budesonide-formoterol (SYMBICORT) 160-4.5 MCG/ACT inhaler, Inhale 2 puffs into the lungs 2 (two) times daily., Disp: 1 Inhaler, Rfl: 3   EPINEPHrine 0.3 mg/0.3 mL IJ SOAJ  injection, See admin instructions., Disp: , Rfl:    fluticasone (FLONASE SENSIMIST) 27.5 MCG/SPRAY nasal spray, 2 sprays each nostril, Disp: , Rfl:    HYDROcodone bit-homatropine (HYCODAN) 5-1.5 MG/5ML syrup, Take 5 mLs by mouth every 4 (four) hours as needed., Disp: 240 mL, Rfl: 0   ketotifen (ZADITOR) 0.025 % ophthalmic solution, 1 drop into affected eye, Disp: , Rfl:    levocetirizine (XYZAL) 5 MG tablet, Take 1 tablet (5 mg total) by mouth daily. (Patient taking differently: Take 5 mg by mouth as needed.), Disp: 60 tablet, Rfl: 4   montelukast (SINGULAIR) 10 MG tablet, Take 1 tablet (10 mg total) by mouth at bedtime., Disp: 30 tablet, Rfl: 6   predniSONE (STERAPRED UNI-PAK 21 TAB) 10 MG (21) TBPK tablet, Take as directed, Disp: 21 tablet, Rfl: 0   Spacer/Aero-Holding Chambers (AEROCHAMBER MAX W/FLOW-VU) MISC, See admin instructions., Disp: , Rfl:    amphetamine-dextroamphetamine (ADDERALL XR) 20 MG 24 hr capsule, Take 1 capsule (20 mg total) by mouth daily., Disp: 30 capsule, Rfl: 0   amphetamine-dextroamphetamine (ADDERALL XR) 20 MG 24 hr capsule, Take 1 capsule (20 mg total) by mouth daily., Disp: 30 capsule, Rfl: 0   amphetamine-dextroamphetamine (ADDERALL XR) 20 MG 24 hr capsule, Take 1 capsule (20 mg total) by mouth every morning., Disp: 30 capsule, Rfl: 0  EXAM:   VITALS per patient if applicable: None reported  GENERAL: alert, oriented, appears well and in no acute distress  HEENT: atraumatic, conjunttiva clear, no obvious abnormalities on inspection of external nose and ears  NECK: normal movements of the head and neck  LUNGS: on inspection no signs of respiratory distress, breathing rate appears normal, no obvious gross increased work of breathing, gasping or wheezing  CV: no obvious cyanosis  MS: moves all visible extremities without noticeable abnormality  PSYCH/NEURO: pleasant and cooperative, no obvious depression or anxiety, speech and thought processing grossly  intact  ASSESSMENT AND PLAN:   Attention deficit hyperactivity disorder (ADHD), combined type  -PDMP reviewed, no red flags, overdose risk score is 180. -Refill Adderall XR 20 mg to take 1 tablet daily for total of 30 tablets a month x3 months.     I discussed the assessment and treatment plan with the patient. The patient was provided an opportunity to ask questions and all were answered. The patient agreed with the plan and demonstrated an understanding of the instructions.   The patient was advised to call back or seek an in-person evaluation if the symptoms worsen or if the condition fails to improve as anticipated.    Lelon Frohlich, MD  [facility] at [facility]

## 2021-06-21 DIAGNOSIS — J3089 Other allergic rhinitis: Secondary | ICD-10-CM | POA: Diagnosis not present

## 2021-06-21 DIAGNOSIS — J3081 Allergic rhinitis due to animal (cat) (dog) hair and dander: Secondary | ICD-10-CM | POA: Diagnosis not present

## 2021-06-21 DIAGNOSIS — J301 Allergic rhinitis due to pollen: Secondary | ICD-10-CM | POA: Diagnosis not present

## 2021-07-27 DIAGNOSIS — J301 Allergic rhinitis due to pollen: Secondary | ICD-10-CM | POA: Diagnosis not present

## 2021-07-27 DIAGNOSIS — J3089 Other allergic rhinitis: Secondary | ICD-10-CM | POA: Diagnosis not present

## 2021-07-27 DIAGNOSIS — J3081 Allergic rhinitis due to animal (cat) (dog) hair and dander: Secondary | ICD-10-CM | POA: Diagnosis not present

## 2021-08-02 DIAGNOSIS — J3081 Allergic rhinitis due to animal (cat) (dog) hair and dander: Secondary | ICD-10-CM | POA: Diagnosis not present

## 2021-08-02 DIAGNOSIS — J301 Allergic rhinitis due to pollen: Secondary | ICD-10-CM | POA: Diagnosis not present

## 2021-08-02 DIAGNOSIS — J3089 Other allergic rhinitis: Secondary | ICD-10-CM | POA: Diagnosis not present

## 2021-08-31 DIAGNOSIS — J301 Allergic rhinitis due to pollen: Secondary | ICD-10-CM | POA: Diagnosis not present

## 2021-08-31 DIAGNOSIS — J3089 Other allergic rhinitis: Secondary | ICD-10-CM | POA: Diagnosis not present

## 2021-08-31 DIAGNOSIS — J3081 Allergic rhinitis due to animal (cat) (dog) hair and dander: Secondary | ICD-10-CM | POA: Diagnosis not present

## 2021-09-20 DIAGNOSIS — J301 Allergic rhinitis due to pollen: Secondary | ICD-10-CM | POA: Diagnosis not present

## 2021-09-20 DIAGNOSIS — J3089 Other allergic rhinitis: Secondary | ICD-10-CM | POA: Diagnosis not present

## 2021-09-20 DIAGNOSIS — J3081 Allergic rhinitis due to animal (cat) (dog) hair and dander: Secondary | ICD-10-CM | POA: Diagnosis not present

## 2021-10-19 DIAGNOSIS — J3081 Allergic rhinitis due to animal (cat) (dog) hair and dander: Secondary | ICD-10-CM | POA: Diagnosis not present

## 2021-10-19 DIAGNOSIS — J301 Allergic rhinitis due to pollen: Secondary | ICD-10-CM | POA: Diagnosis not present

## 2021-10-19 DIAGNOSIS — J3089 Other allergic rhinitis: Secondary | ICD-10-CM | POA: Diagnosis not present

## 2021-11-08 ENCOUNTER — Encounter: Payer: BC Managed Care – PPO | Admitting: Internal Medicine

## 2021-11-14 DIAGNOSIS — J3081 Allergic rhinitis due to animal (cat) (dog) hair and dander: Secondary | ICD-10-CM | POA: Diagnosis not present

## 2021-11-14 DIAGNOSIS — J301 Allergic rhinitis due to pollen: Secondary | ICD-10-CM | POA: Diagnosis not present

## 2021-11-14 DIAGNOSIS — J3089 Other allergic rhinitis: Secondary | ICD-10-CM | POA: Diagnosis not present

## 2021-11-15 ENCOUNTER — Ambulatory Visit (INDEPENDENT_AMBULATORY_CARE_PROVIDER_SITE_OTHER): Payer: BC Managed Care – PPO | Admitting: Internal Medicine

## 2021-11-15 ENCOUNTER — Encounter: Payer: Self-pay | Admitting: Internal Medicine

## 2021-11-15 VITALS — BP 110/80 | HR 68 | Temp 97.7°F | Ht 67.5 in | Wt 186.9 lb

## 2021-11-15 DIAGNOSIS — E559 Vitamin D deficiency, unspecified: Secondary | ICD-10-CM | POA: Diagnosis not present

## 2021-11-15 DIAGNOSIS — Z125 Encounter for screening for malignant neoplasm of prostate: Secondary | ICD-10-CM

## 2021-11-15 DIAGNOSIS — I10 Essential (primary) hypertension: Secondary | ICD-10-CM

## 2021-11-15 DIAGNOSIS — Z23 Encounter for immunization: Secondary | ICD-10-CM | POA: Diagnosis not present

## 2021-11-15 DIAGNOSIS — Z0000 Encounter for general adult medical examination without abnormal findings: Secondary | ICD-10-CM

## 2021-11-15 DIAGNOSIS — F902 Attention-deficit hyperactivity disorder, combined type: Secondary | ICD-10-CM

## 2021-11-15 MED ORDER — AMPHETAMINE-DEXTROAMPHET ER 20 MG PO CP24: 20.0000 mg | ORAL_CAPSULE | Freq: Every day | ORAL | 0 refills | Status: DC

## 2021-11-15 MED ORDER — AMPHETAMINE-DEXTROAMPHET ER 20 MG PO CP24: 20.0000 mg | ORAL_CAPSULE | Freq: Every morning | ORAL | 0 refills | Status: DC

## 2021-11-15 NOTE — Progress Notes (Signed)
Established Patient Office Visit     CC/Reason for Visit: Annual preventive exam  HPI: Adam Terry is a 46 y.o. male who is coming in today for the above mentioned reasons. Past Medical History is significant for: Hypertension, ADHD, vitamin D deficiency, well-controlled asthma.  He has no acute concerns.  He has routine eye and dental care.  He is due for flu and COVID vaccines.  He is due for Adderall refills per contract.   Past Medical/Surgical History: Past Medical History:  Diagnosis Date   ADHD (attention deficit hyperactivity disorder)    ALLERGIC RHINITIS 09/17/2007   Allergy    Asthma    Cataract    forming    Chronic kidney disease    kidney stones    Hypertension    NEPHROLITHIASIS, HX OF 10/20/2007   PONV (postoperative nausea and vomiting)    with anesthesia gas    STONE, URINARY CALCULUS,UNSPEC. 09/17/2007    Past Surgical History:  Procedure Laterality Date   patch to ear drum     TONSILLECTOMY     TYMPANOSTOMY TUBE PLACEMENT     x 13     Social History:  reports that he has never smoked. He has never used smokeless tobacco. He reports current alcohol use. He reports that he does not use drugs.  Allergies: Allergies  Allergen Reactions   Penicillins     REACTION: hives, throat closes   Sulfamethoxazole     REACTION: hives, throat closes    Family History:  Family History  Problem Relation Age of Onset   Urolithiasis Mother    Leukemia Maternal Uncle    Heart disease Maternal Grandmother    Breast cancer Maternal Grandmother    Lung cancer Maternal Grandmother    Colon cancer Neg Hx    Colon polyps Neg Hx    Esophageal cancer Neg Hx    Stomach cancer Neg Hx    Rectal cancer Neg Hx      Current Outpatient Medications:    albuterol (PROVENTIL HFA;VENTOLIN HFA) 108 (90 Base) MCG/ACT inhaler, Inhale 2 puffs into the lungs every 6 (six) hours as needed for wheezing or shortness of breath., Disp: 1 Inhaler, Rfl: 0    amLODipine-valsartan (EXFORGE) 5-320 MG tablet, Take 1 tablet by mouth daily., Disp: 30 tablet, Rfl: 5   aspirin-acetaminophen-caffeine (EXCEDRIN MIGRAINE) 250-250-65 MG per tablet, Take 1 tablet by mouth every 6 (six) hours as needed for headache., Disp: , Rfl:    azelastine (ASTELIN) 0.1 % nasal spray, Place 2 sprays into both nostrils 2 (two) times daily. Use in each nostril as directed, Disp: 30 mL, Rfl: 12   budesonide-formoterol (SYMBICORT) 160-4.5 MCG/ACT inhaler, Inhale 2 puffs into the lungs 2 (two) times daily., Disp: 1 Inhaler, Rfl: 3   EPINEPHrine 0.3 mg/0.3 mL IJ SOAJ injection, See admin instructions., Disp: , Rfl:    fluticasone (FLONASE SENSIMIST) 27.5 MCG/SPRAY nasal spray, 2 sprays each nostril, Disp: , Rfl:    ketotifen (ZADITOR) 0.025 % ophthalmic solution, 1 drop into affected eye, Disp: , Rfl:    levocetirizine (XYZAL) 5 MG tablet, Take 1 tablet (5 mg total) by mouth daily. (Patient taking differently: Take 5 mg by mouth as needed.), Disp: 60 tablet, Rfl: 4   montelukast (SINGULAIR) 10 MG tablet, Take 1 tablet (10 mg total) by mouth at bedtime., Disp: 30 tablet, Rfl: 6   predniSONE (STERAPRED UNI-PAK 21 TAB) 10 MG (21) TBPK tablet, Take as directed, Disp: 21 tablet, Rfl: 0  Spacer/Aero-Holding Chambers (AEROCHAMBER MAX W/FLOW-VU) MISC, See admin instructions., Disp: , Rfl:    amphetamine-dextroamphetamine (ADDERALL XR) 20 MG 24 hr capsule, Take 1 capsule (20 mg total) by mouth daily., Disp: 30 capsule, Rfl: 0   amphetamine-dextroamphetamine (ADDERALL XR) 20 MG 24 hr capsule, Take 1 capsule (20 mg total) by mouth every morning., Disp: 30 capsule, Rfl: 0   amphetamine-dextroamphetamine (ADDERALL XR) 20 MG 24 hr capsule, Take 1 capsule (20 mg total) by mouth daily., Disp: 30 capsule, Rfl: 0  Review of Systems:  Constitutional: Denies fever, chills, diaphoresis, appetite change and fatigue.  HEENT: Denies photophobia, eye pain, redness, hearing loss, ear pain, congestion, sore  throat, rhinorrhea, sneezing, mouth sores, trouble swallowing, neck pain, neck stiffness and tinnitus.   Respiratory: Denies SOB, DOE, cough, chest tightness,  and wheezing.   Cardiovascular: Denies chest pain, palpitations and leg swelling.  Gastrointestinal: Denies nausea, vomiting, abdominal pain, diarrhea, constipation, blood in stool and abdominal distention.  Genitourinary: Denies dysuria, urgency, frequency, hematuria, flank pain and difficulty urinating.  Endocrine: Denies: hot or cold intolerance, sweats, changes in hair or nails, polyuria, polydipsia. Musculoskeletal: Denies myalgias, back pain, joint swelling, arthralgias and gait problem.  Skin: Denies pallor, rash and wound.  Neurological: Denies dizziness, seizures, syncope, weakness, light-headedness, numbness and headaches.  Hematological: Denies adenopathy. Easy bruising, personal or family bleeding history  Psychiatric/Behavioral: Denies suicidal ideation, mood changes, confusion, nervousness, sleep disturbance and agitation    Physical Exam: Vitals:   11/15/21 1433  BP: 110/80  Pulse: 68  Temp: 97.7 F (36.5 C)  TempSrc: Oral  SpO2: 99%  Weight: 186 lb 14.4 oz (84.8 kg)  Height: 5' 7.5" (1.715 m)    Body mass index is 28.84 kg/m.   Constitutional: NAD, calm, comfortable Eyes: PERRL, lids and conjunctivae normal, wears corrective lenses ENMT: Mucous membranes are moist. Posterior pharynx clear of any exudate or lesions. Normal dentition. Tympanic membrane is pearly white, no erythema or bulging. Neck: normal, supple, no masses, no thyromegaly Respiratory: clear to auscultation bilaterally, no wheezing, no crackles. Normal respiratory effort. No accessory muscle use.  Cardiovascular: Regular rate and rhythm, no murmurs / rubs / gallops. No extremity edema. 2+ pedal pulses. No carotid bruits.  Abdomen: no tenderness, no masses palpated. No hepatosplenomegaly. Bowel sounds positive.  Musculoskeletal: no clubbing /  cyanosis. No joint deformity upper and lower extremities. Good ROM, no contractures. Normal muscle tone.  Skin: no rashes, lesions, ulcers. No induration Neurologic: CN 2-12 grossly intact. Sensation intact, DTR normal. Strength 5/5 in all 4.  Psychiatric: Normal judgment and insight. Alert and oriented x 3. Normal mood.   [place] Office Visit from 11/15/2021 in [place] at [place]  PHQ-9 Total Score 0         Impression and Plan:  Encounter for preventive health examination - Plan: PSA  Attention deficit hyperactivity disorder (ADHD), combined type - Plan: amphetamine-dextroamphetamine (ADDERALL XR) 20 MG 24 hr capsule, amphetamine-dextroamphetamine (ADDERALL XR) 20 MG 24 hr capsule, amphetamine-dextroamphetamine (ADDERALL XR) 20 MG 24 hr capsule  Needs flu shot - Plan: Flu Vaccine QUAD 6+ mos PF IM (Fluarix Quad PF)  Vitamin D deficiency - Plan: VITAMIN D 25 Hydroxy (Vit-D Deficiency, Fractures)  Essential hypertension - Plan: CBC with Differential/Platelet, Comprehensive metabolic panel, Hemoglobin A1c, Lipid panel    -Recommend routine eye and dental care. -Immunizations: Flu vaccine administered today, advised COVID vaccination at pharmacy. -Healthy lifestyle discussed in detail. -Labs to be updated today. -Colon cancer screening: 01/2020 -Breast cancer screening: Not applicable -  Cervical cancer screening: Not applicable -Lung cancer screening: Not applicable -Prostate cancer screening: PSA today -DEXA: Not applicable  -PDMP reviewed, no red flags, overdose risk score is 190.   -Refill Adderall XR 20 mg to take 1 tablet daily for total of 30 tablets a month x3 months.     Lelon Frohlich, MD [facility] at [facility]

## 2021-11-16 LAB — CBC WITH DIFFERENTIAL/PLATELET
Basophils Absolute: 0 10*3/uL (ref 0.0–0.1)
Basophils Relative: 0.3 % (ref 0.0–3.0)
Eosinophils Absolute: 0.1 10*3/uL (ref 0.0–0.7)
Eosinophils Relative: 1.3 % (ref 0.0–5.0)
HCT: 42.4 % (ref 39.0–52.0)
Hemoglobin: 14.5 g/dL (ref 13.0–17.0)
Lymphocytes Relative: 25.8 % (ref 12.0–46.0)
Lymphs Abs: 1.8 10*3/uL (ref 0.7–4.0)
MCHC: 34.3 g/dL (ref 30.0–36.0)
MCV: 91.6 fl (ref 78.0–100.0)
Monocytes Absolute: 0.5 10*3/uL (ref 0.1–1.0)
Monocytes Relative: 7.1 % (ref 3.0–12.0)
Neutro Abs: 4.4 10*3/uL (ref 1.4–7.7)
Neutrophils Relative %: 65.5 % (ref 43.0–77.0)
Platelets: 216 10*3/uL (ref 150.0–400.0)
RBC: 4.62 Mil/uL (ref 4.22–5.81)
RDW: 13.3 % (ref 11.5–15.5)
WBC: 6.8 10*3/uL (ref 4.0–10.5)

## 2021-11-16 LAB — LIPID PANEL
Cholesterol: 142 mg/dL (ref 0–200)
HDL: 43.1 mg/dL (ref >39.00)
LDL Cholesterol: 84 mg/dL (ref 0–99)
NonHDL: 98.99
Total CHOL/HDL Ratio: 3
Triglycerides: 73 mg/dL (ref 0.0–149.0)
VLDL: 14.6 mg/dL (ref 0.0–40.0)

## 2021-11-16 LAB — COMPREHENSIVE METABOLIC PANEL WITH GFR
ALT: 15 U/L (ref 0–53)
AST: 21 U/L (ref 0–37)
Albumin: 4.8 g/dL (ref 3.5–5.2)
Alkaline Phosphatase: 57 U/L (ref 39–117)
BUN: 12 mg/dL (ref 6–23)
CO2: 30 meq/L (ref 19–32)
Calcium: 9.6 mg/dL (ref 8.4–10.5)
Chloride: 99 meq/L (ref 96–112)
Creatinine, Ser: 1.04 mg/dL (ref 0.40–1.50)
GFR: 85.06 mL/min (ref >60.00)
Glucose, Bld: 76 mg/dL (ref 70–99)
Potassium: 4.7 meq/L (ref 3.5–5.1)
Sodium: 137 meq/L (ref 135–145)
Total Bilirubin: 0.5 mg/dL (ref 0.2–1.2)
Total Protein: 7.5 g/dL (ref 6.0–8.3)

## 2021-11-16 LAB — VITAMIN D 25 HYDROXY (VIT D DEFICIENCY, FRACTURES): VITD: 34.02 ng/mL (ref 30.00–100.00)

## 2021-11-16 LAB — HEMOGLOBIN A1C: Hgb A1c MFr Bld: 5.6 % (ref 4.6–6.5)

## 2021-11-16 LAB — PSA: PSA: 1.15 ng/mL (ref 0.10–4.00)

## 2021-11-20 ENCOUNTER — Telehealth: Payer: Self-pay | Admitting: *Deleted

## 2021-11-20 ENCOUNTER — Encounter: Payer: Self-pay | Admitting: *Deleted

## 2021-11-20 NOTE — Patient Instructions (Signed)
Visit Information  Thank you for taking time to visit with me today. Please don't hesitate to contact me if I can be of assistance to you.   Following are the goals we discussed today:   Goals Addressed               This Visit's Progress     COMPLETED: no needs (pt-stated)        Care Coordination Interventions: Reviewed medications with patient and discussed Adherence with all prescribed medications Reviewed scheduled/upcoming provider appointments including pending appointments with sufficient transportation Screening for signs and symptoms of depression related to chronic disease state  Assessed social determinant of health barriers          Please call the care guide team at [phone] if you need to cancel or reschedule your appointment.   If you are experiencing a Mental Health or [place] or need someone to talk to, please call the Suicide and Crisis Lifeline: 988  Patient verbalizes understanding of instructions and care plan provided today and agrees to view in [place]. Active MyChart status and patient understanding of how to access instructions and care plan via MyChart confirmed with patient.     No further follow up required: No needs  Raina Mina, RN Care Management Coordinator [place] Office [phone]

## 2021-11-20 NOTE — Patient Outreach (Signed)
  Care Coordination   Initial Visit Note   11/20/2021 Name: Adam Terry MRN: [id number] DOB: Sep 17, 1975  Adam Terry is a 46 y.o. year old male who sees Adam Terry, Adam Halsted, MD for primary care. I spoke with  Adam Terry by phone today.  What matters to the patients health and wellness today?  No needs    Goals Addressed               This Visit's Progress     COMPLETED: no needs (pt-stated)        Care Coordination Interventions: Reviewed medications with patient and discussed Adherence with all prescribed medications Reviewed scheduled/upcoming provider appointments including pending appointments with sufficient transportation Screening for signs and symptoms of depression related to chronic disease state  Assessed social determinant of health barriers          SDOH assessments and interventions completed:  Yes  SDOH Interventions Today    Flowsheet Row Most Recent Value  SDOH Interventions   Food Insecurity Interventions Intervention Not Indicated  Housing Interventions Intervention Not Indicated  Transportation Interventions Intervention Not Indicated  Utilities Interventions Intervention Not Indicated        Care Coordination Interventions Activated:  Yes  Care Coordination Interventions:  Yes, provided   Follow up plan: No further intervention required.   Encounter Outcome:  Pt. Visit Completed   Raina Mina, RN Care Management Coordinator [place] Office [phone]

## 2021-11-23 DIAGNOSIS — J302 Other seasonal allergic rhinitis: Secondary | ICD-10-CM | POA: Diagnosis not present

## 2021-11-23 DIAGNOSIS — J4540 Moderate persistent asthma, uncomplicated: Secondary | ICD-10-CM | POA: Diagnosis not present

## 2021-11-23 DIAGNOSIS — H1045 Other chronic allergic conjunctivitis: Secondary | ICD-10-CM | POA: Diagnosis not present

## 2021-11-23 DIAGNOSIS — J3081 Allergic rhinitis due to animal (cat) (dog) hair and dander: Secondary | ICD-10-CM | POA: Diagnosis not present

## 2021-11-23 DIAGNOSIS — J301 Allergic rhinitis due to pollen: Secondary | ICD-10-CM | POA: Diagnosis not present

## 2021-12-12 DIAGNOSIS — J3081 Allergic rhinitis due to animal (cat) (dog) hair and dander: Secondary | ICD-10-CM | POA: Diagnosis not present

## 2021-12-12 DIAGNOSIS — J3089 Other allergic rhinitis: Secondary | ICD-10-CM | POA: Diagnosis not present

## 2021-12-12 DIAGNOSIS — J301 Allergic rhinitis due to pollen: Secondary | ICD-10-CM | POA: Diagnosis not present

## 2022-01-02 DIAGNOSIS — J3089 Other allergic rhinitis: Secondary | ICD-10-CM | POA: Diagnosis not present

## 2022-01-02 DIAGNOSIS — J301 Allergic rhinitis due to pollen: Secondary | ICD-10-CM | POA: Diagnosis not present

## 2022-01-02 DIAGNOSIS — J3081 Allergic rhinitis due to animal (cat) (dog) hair and dander: Secondary | ICD-10-CM | POA: Diagnosis not present

## 2022-01-07 ENCOUNTER — Other Ambulatory Visit: Payer: Self-pay | Admitting: Internal Medicine

## 2022-01-07 DIAGNOSIS — I10 Essential (primary) hypertension: Secondary | ICD-10-CM

## 2022-01-31 DIAGNOSIS — J3089 Other allergic rhinitis: Secondary | ICD-10-CM | POA: Diagnosis not present

## 2022-01-31 DIAGNOSIS — J3081 Allergic rhinitis due to animal (cat) (dog) hair and dander: Secondary | ICD-10-CM | POA: Diagnosis not present

## 2022-01-31 DIAGNOSIS — J301 Allergic rhinitis due to pollen: Secondary | ICD-10-CM | POA: Diagnosis not present

## 2022-02-13 ENCOUNTER — Emergency Department (HOSPITAL_BASED_OUTPATIENT_CLINIC_OR_DEPARTMENT_OTHER)
Admission: EM | Admit: 2022-02-13 | Discharge: 2022-02-13 | Disposition: A | Discharge status: Home / Self Care | Payer: BC Managed Care – PPO | Attending: Emergency Medicine | Admitting: Emergency Medicine

## 2022-02-13 ENCOUNTER — Other Ambulatory Visit: Payer: Self-pay

## 2022-02-13 ENCOUNTER — Encounter (HOSPITAL_BASED_OUTPATIENT_CLINIC_OR_DEPARTMENT_OTHER): Payer: Self-pay

## 2022-02-13 DIAGNOSIS — R310 Gross hematuria: Secondary | ICD-10-CM | POA: Insufficient documentation

## 2022-02-13 DIAGNOSIS — Z87442 Personal history of urinary calculi: Secondary | ICD-10-CM | POA: Insufficient documentation

## 2022-02-13 DIAGNOSIS — R110 Nausea: Secondary | ICD-10-CM | POA: Diagnosis not present

## 2022-02-13 DIAGNOSIS — Z7982 Long term (current) use of aspirin: Secondary | ICD-10-CM | POA: Insufficient documentation

## 2022-02-13 DIAGNOSIS — Z7952 Long term (current) use of systemic steroids: Secondary | ICD-10-CM | POA: Diagnosis not present

## 2022-02-13 DIAGNOSIS — I10 Essential (primary) hypertension: Secondary | ICD-10-CM | POA: Insufficient documentation

## 2022-02-13 DIAGNOSIS — Z7951 Long term (current) use of inhaled steroids: Secondary | ICD-10-CM | POA: Insufficient documentation

## 2022-02-13 DIAGNOSIS — J45909 Unspecified asthma, uncomplicated: Secondary | ICD-10-CM | POA: Insufficient documentation

## 2022-02-13 DIAGNOSIS — R109 Unspecified abdominal pain: Secondary | ICD-10-CM | POA: Insufficient documentation

## 2022-02-13 DIAGNOSIS — Z79899 Other long term (current) drug therapy: Secondary | ICD-10-CM | POA: Insufficient documentation

## 2022-02-13 LAB — URINALYSIS, ROUTINE W REFLEX MICROSCOPIC
Bacteria, UA: NONE SEEN
Bilirubin Urine: NEGATIVE
Glucose, UA: NEGATIVE mg/dL
Ketones, ur: NEGATIVE mg/dL
Leukocytes,Ua: NEGATIVE
Nitrite: NEGATIVE
RBC / HPF: >50 RBC/hpf (ref 0–5)
Specific Gravity, Urine: 1.023 (ref 1.005–1.030)
pH: 5.5 (ref 5.0–8.0)

## 2022-02-13 MED ORDER — KETOROLAC TROMETHAMINE 15 MG/ML IJ SOLN
15.0000 mg | Freq: Once | INTRAMUSCULAR | Status: AC
  Administered 2022-02-13: 15 mg via INTRAMUSCULAR
  Filled 2022-02-13: qty 1

## 2022-02-13 MED ORDER — HYDROCODONE-ACETAMINOPHEN 5-325 MG PO TABS: 1.0000 | ORAL_TABLET | ORAL | 0 refills | Status: DC | PRN

## 2022-02-13 MED ORDER — ONDANSETRON 4 MG PO TBDP: 4.0000 mg | ORAL_TABLET | Freq: Three times a day (TID) | ORAL | 0 refills | Status: DC | PRN

## 2022-02-13 NOTE — ED Triage Notes (Signed)
He c/o left flank pain since yesterday. He recognizes it as probable "kidney stone". He states he passed a kidney stone in 2019.

## 2022-02-13 NOTE — Discharge Instructions (Addendum)
Strain your urine.  With your urologist, call today to schedule appointment. Norco as needed as prescribed for pain not controlled with Motrin. Zofran as needed as prescribed for nausea and vomiting.  Do not drive or operate machinery while taking Norco.  Return to the ER for fevers, pain not controlled with pain medication, vomiting not controlled with Zofran.  Work note provided should you need it.

## 2022-02-13 NOTE — ED Provider Notes (Signed)
[facility] Provider Note   CSN: [id number] Arrival date & time: 02/13/22  0840     History  Chief Complaint  Patient presents with   Flank Pain    Adam Terry is a 47 y.o. male.  47 year old male with history of kidney stone (2019 right side), left flank pain with hematuria onset yesterday. Mild nausea. Denies fevers, chills, vomiting. Took Norco at home with relief at 1:30am today when pain was intense and he couldn't tolerate the pain any longer, also drinking lots of fluids. States pain is still at 7/10, waxes and wanes in severity.        Home Medications Prior to Admission medications   Medication Sig Start Date End Date Taking? Authorizing Provider  HYDROcodone-acetaminophen (NORCO/VICODIN) 5-325 MG tablet Take 1 tablet by mouth every 4 (four) hours as needed. 02/13/22  Yes Tacy Learn, PA-C  ondansetron (ZOFRAN-ODT) 4 MG disintegrating tablet Take 1 tablet (4 mg total) by mouth every 8 (eight) hours as needed for nausea or vomiting. 02/13/22  Yes Tacy Learn, PA-C  albuterol (PROVENTIL HFA;VENTOLIN HFA) 108 (90 Base) MCG/ACT inhaler Inhale 2 puffs into the lungs every 6 (six) hours as needed for wheezing or shortness of breath. 10/25/16   Marletta Lor, MD  amLODipine-valsartan (EXFORGE) 5-320 MG tablet TAKE ONE TABLET BY MOUTH DAILY 01/08/22   Isaac Bliss, Rayford Halsted, MD  amphetamine-dextroamphetamine (ADDERALL XR) 20 MG 24 hr capsule Take 1 capsule (20 mg total) by mouth daily. 11/15/21   Erline Hau, MD  amphetamine-dextroamphetamine (ADDERALL XR) 20 MG 24 hr capsule Take 1 capsule (20 mg total) by mouth every morning. 11/15/21   Erline Hau, MD  amphetamine-dextroamphetamine (ADDERALL XR) 20 MG 24 hr capsule Take 1 capsule (20 mg total) by mouth daily. 11/15/21   Isaac Bliss, Rayford Halsted, MD  aspirin-acetaminophen-caffeine (EXCEDRIN MIGRAINE) [phone] MG per tablet Take 1  tablet by mouth every 6 (six) hours as needed for headache.    [provider]  azelastine (ASTELIN) 0.1 % nasal spray Place 2 sprays into both nostrils 2 (two) times daily. Use in each nostril as directed 03/22/15   Kuneff, Renee A, DO  budesonide-formoterol (SYMBICORT) 160-4.5 MCG/ACT inhaler Inhale 2 puffs into the lungs 2 (two) times daily. 10/08/16   Marletta Lor, MD  EPINEPHrine 0.3 mg/0.3 mL IJ SOAJ injection See admin instructions.    [provider]  fluticasone (FLONASE SENSIMIST) 27.5 MCG/SPRAY nasal spray 2 sprays each nostril    [provider]  ketotifen (ZADITOR) 0.025 % ophthalmic solution 1 drop into affected eye    [provider]  levocetirizine (XYZAL) 5 MG tablet Take 1 tablet (5 mg total) by mouth daily. Patient taking differently: Take 5 mg by mouth as needed. 03/13/17   Marletta Lor, MD  montelukast (SINGULAIR) 10 MG tablet Take 1 tablet (10 mg total) by mouth at bedtime. 03/13/17   Marletta Lor, MD  predniSONE (STERAPRED UNI-PAK 21 TAB) 10 MG (21) TBPK tablet Take as directed 12/01/20   Isaac Bliss, Rayford Halsted, MD  Spacer/Aero-Holding Chambers (AEROCHAMBER MAX W/FLOW-VU) MISC See admin instructions.    [provider]      Allergies    Penicillins and Sulfamethoxazole    Review of Systems   Review of Systems Negative except as per HPI Physical Exam Updated Vital Signs BP 119/78 (BP Location: Left Arm)   Pulse 74   Temp 98.1 F (36.7  C) (Oral)   Resp 16   SpO2 100%  Physical Exam Vitals and nursing note reviewed.  Constitutional:      General: He is not in acute distress.    Appearance: He is well-developed. He is not diaphoretic.  HENT:     Head: Normocephalic and atraumatic.  Cardiovascular:     Rate and Rhythm: Normal rate and regular rhythm.     Heart sounds: Normal heart sounds.  Pulmonary:     Effort: Pulmonary effort is normal.     Breath sounds: Normal breath sounds.  Abdominal:      Palpations: Abdomen is soft.     Tenderness: There is no abdominal tenderness. There is no right CVA tenderness or left CVA tenderness.  Musculoskeletal:     Right lower leg: No edema.     Left lower leg: No edema.  Skin:    General: Skin is warm and dry.     Findings: No erythema or rash.  Neurological:     Mental Status: He is alert and oriented to person, place, and time.  Psychiatric:        Behavior: Behavior normal.     ED Results / Procedures / Treatments   Labs (all labs ordered are listed, but only abnormal results are displayed) Labs Reviewed  URINALYSIS, ROUTINE W REFLEX MICROSCOPIC - Abnormal; Notable for the following components:      Result Value   Hgb urine dipstick LARGE (*)    Protein, ur TRACE (*)    All other components within normal limits    EKG None  Radiology No results found.  Procedures Procedures    Medications Ordered in ED Medications  ketorolac (TORADOL) 15 MG/ML injection 15 mg (15 mg Intramuscular Given 02/13/22 1017)    ED Course/ Medical Decision Making/ A&P                             Medical Decision Making Amount and/or Complexity of Data Reviewed Labs: ordered.  Risk Prescription drug management.   This patient presents to the ED for concern of left flank pain, this involves an extensive number of treatment options, and is a complaint that carries with it a high risk of complications and morbidity.  The differential diagnosis includes but not limited to kidney stone, pyelonephritis, urinary tract infection   Co morbidities that complicate the patient evaluation  Kidney stones, allergic rhinitis, ADHD, hypertension, asthma   Additional history obtained:  Additional history obtained from wife at bedside who contributes to history as above External records from outside source obtained and reviewed including prior labs on file with normal creatinine.  No prior imaging for review related to kidney stones   Lab  Tests:  I Ordered, and personally interpreted labs.  The pertinent results include: Urinalysis with trace protein, large hemoglobin, no evidence of infection   Imaging Studies ordered:  I ordered imaging studies including CT stone study Patient declined   Problem List / ED Course / Critical interventions / Medication management  47 yo male with left flank pain and gross hematuria. Pain is constant, waxes and wanes in severity with nausea. Denies vomiting, fevers, chills, changes in bowel habits. Hx of stones, feels similar. Has passed stones without intervention previously. Well appearing on exam, abd soft and non tender, no CVA tenderness. UA consistent with stone without evidence of infection.  Offered imaging and labs for further evaluation versus defer as pain appears controlled and patient  does not have fever/vomiting/dysuria.  Initially, patient requested CT/felt like pain was not very well-controlled and that imaging might help with his urology follow-up and a CT was ordered.  Patient then decided not to do the CT scan at this time as he does not have any concerning symptoms as above and has passed stones previously without intervention.  This seems reasonable, patient is able to follow-up with his urologist.  Rozetta Nunnery to return to the ER should he develop any fever or face pain or nausea or not controlled with medications.  He is provided with Toradol prior to discharge for pain control with prescriptions for Norco and Zofran sent to his pharmacy. I ordered medication including Toradol for pain Reevaluation of the patient after these medicines showed that the patient improved I have reviewed the patients home medicines and have made adjustments as needed   Social Determinants of Health:  Lives with family, has specialty follow-up   Test / Admission - Considered:  Consider labs and CT stone study.  Shared decision making, as patient reports pain to be 7 out of 10, recommended CT  urogram however after this discussion, patient declined study, states that he feels like he can probably pass the stone and does not need this imaging at this time but will return if needed.  Lab work unnecessary as pain is controlled, not vomiting, no evidence of urinary tract infection, blood pressure normal.         Final Clinical Impression(s) / ED Diagnoses Final diagnoses:  Left flank pain  Gross hematuria  History of kidney stones    Rx / DC Orders ED Discharge Orders          Ordered    HYDROcodone-acetaminophen (NORCO/VICODIN) 5-325 MG tablet  Every 4 hours PRN        02/13/22 1007    ondansetron (ZOFRAN-ODT) 4 MG disintegrating tablet  Every 8 hours PRN        02/13/22 1007              Tacy Learn, PA-C 02/13/22 1114    Horton, Alvin Critchley, DO 02/13/22 1507

## 2022-02-22 ENCOUNTER — Other Ambulatory Visit: Payer: Self-pay | Admitting: Urology

## 2022-02-22 DIAGNOSIS — K5730 Diverticulosis of large intestine without perforation or abscess without bleeding: Secondary | ICD-10-CM | POA: Diagnosis not present

## 2022-02-22 DIAGNOSIS — N132 Hydronephrosis with renal and ureteral calculous obstruction: Secondary | ICD-10-CM | POA: Diagnosis not present

## 2022-02-22 DIAGNOSIS — R8271 Bacteriuria: Secondary | ICD-10-CM | POA: Diagnosis not present

## 2022-02-22 DIAGNOSIS — N200 Calculus of kidney: Secondary | ICD-10-CM | POA: Diagnosis not present

## 2022-02-22 DIAGNOSIS — R1084 Generalized abdominal pain: Secondary | ICD-10-CM | POA: Diagnosis not present

## 2022-02-22 NOTE — Progress Notes (Signed)
Pre op phone call completed.  Pt aware he is NPO after midnight and other instructions he understands. Arrive at [facility].

## 2022-02-25 ENCOUNTER — Encounter (HOSPITAL_BASED_OUTPATIENT_CLINIC_OR_DEPARTMENT_OTHER): Payer: Self-pay | Admitting: Urology

## 2022-02-25 ENCOUNTER — Ambulatory Visit (HOSPITAL_COMMUNITY): Payer: BC Managed Care – PPO

## 2022-02-25 ENCOUNTER — Encounter (HOSPITAL_BASED_OUTPATIENT_CLINIC_OR_DEPARTMENT_OTHER)
Admission: RE | Disposition: A | Discharge status: Home / Self Care | Payer: Self-pay | Source: Home / Self Care | Attending: Urology

## 2022-02-25 ENCOUNTER — Ambulatory Visit (HOSPITAL_BASED_OUTPATIENT_CLINIC_OR_DEPARTMENT_OTHER)
Admission: RE | Admit: 2022-02-25 | Discharge: 2022-02-25 | Disposition: A | Discharge status: Home / Self Care | Payer: BC Managed Care – PPO | Attending: Urology | Admitting: Urology

## 2022-02-25 DIAGNOSIS — I10 Essential (primary) hypertension: Secondary | ICD-10-CM | POA: Diagnosis not present

## 2022-02-25 DIAGNOSIS — I878 Other specified disorders of veins: Secondary | ICD-10-CM | POA: Diagnosis not present

## 2022-02-25 DIAGNOSIS — N201 Calculus of ureter: Secondary | ICD-10-CM

## 2022-02-25 DIAGNOSIS — N132 Hydronephrosis with renal and ureteral calculous obstruction: Secondary | ICD-10-CM | POA: Insufficient documentation

## 2022-02-25 DIAGNOSIS — J45909 Unspecified asthma, uncomplicated: Secondary | ICD-10-CM | POA: Insufficient documentation

## 2022-02-25 DIAGNOSIS — N200 Calculus of kidney: Secondary | ICD-10-CM | POA: Diagnosis not present

## 2022-02-25 HISTORY — PX: EXTRACORPOREAL SHOCK WAVE LITHOTRIPSY: SHX1557

## 2022-02-25 SURGERY — LITHOTRIPSY, ESWL
Anesthesia: LOCAL | Laterality: Left

## 2022-02-25 MED ORDER — CIPROFLOXACIN HCL 500 MG PO TABS
500.0000 mg | ORAL_TABLET | ORAL | Status: AC
  Administered 2022-02-25: 500 mg via ORAL

## 2022-02-25 MED ORDER — DIAZEPAM 5 MG PO TABS
10.0000 mg | ORAL_TABLET | ORAL | Status: AC
  Administered 2022-02-25: 10 mg via ORAL

## 2022-02-25 MED ORDER — DIPHENHYDRAMINE HCL 25 MG PO CAPS
ORAL_CAPSULE | ORAL | Status: AC
  Filled 2022-02-25: qty 1

## 2022-02-25 MED ORDER — HYDROCODONE-ACETAMINOPHEN 5-325 MG PO TABS: 1.0000 | ORAL_TABLET | ORAL | 0 refills | Status: DC | PRN

## 2022-02-25 MED ORDER — DIAZEPAM 5 MG PO TABS
ORAL_TABLET | ORAL | Status: AC
  Filled 2022-02-25: qty 2

## 2022-02-25 MED ORDER — SODIUM CHLORIDE 0.9 % IV SOLN: INTRAVENOUS | Status: DC

## 2022-02-25 MED ORDER — DOCUSATE SODIUM 100 MG PO CAPS: 100.0000 mg | ORAL_CAPSULE | Freq: Every day | ORAL | 0 refills | Status: DC | PRN

## 2022-02-25 MED ORDER — DIPHENHYDRAMINE HCL 25 MG PO CAPS
25.0000 mg | ORAL_CAPSULE | ORAL | Status: AC
  Administered 2022-02-25: 25 mg via ORAL

## 2022-02-25 MED ORDER — CIPROFLOXACIN HCL 500 MG PO TABS
ORAL_TABLET | ORAL | Status: AC
  Filled 2022-02-25: qty 1

## 2022-02-25 MED ORDER — DIAZEPAM 5 MG PO TABS
ORAL_TABLET | ORAL | Status: AC
  Filled 2022-02-25: qty 1

## 2022-02-25 NOTE — Op Note (Signed)
[facility] Operative Note  Treating Physician: Rexene Alberts, MD  Pre-op diagnosis: Left ESWL  Post-op diagnosis: Same   Procedure: Left ESWL  See Aris Everts OP note scanned into chart. Also because of the size, density, location and other factors that cannot be anticipated I feel this will likely be a staged procedure. This fact supersedes any indication in the scanned [place] stone operative note to the contrary.  Matt R. [facility]  Pager: [phone]

## 2022-02-25 NOTE — Discharge Instructions (Addendum)
Activity:  You are encouraged to ambulate frequently (about every hour during waking hours) to help prevent blood clots from forming in your legs or lungs.    Diet: You should advance your diet as instructed by your physician.  It will be normal to have some bloating, nausea, and abdominal discomfort intermittently.  Prescriptions:  You will be provided a prescription for pain medication to take as needed.  If your pain is not severe enough to require the prescription pain medication, you may take extra strength Tylenol instead which will have less side effects.  You should also take a prescribed stool softener to avoid straining with bowel movements as the prescription pain medication may constipate you.  What to call us about: You should call the office [phone]) if you develop fever > 101 or develop persistent vomiting. Activity:  You are encouraged to ambulate frequently (about every hour during waking hours) to help prevent blood clots from forming in your legs or lungs.      Post Anesthesia Home Care Instructions  Activity: Get plenty of rest for the remainder of the day. A responsible individual must stay with you for 24 hours following the procedure.  For the next 24 hours, DO NOT: -Drive a car -Paediatric nurse -Drink alcoholic beverages -Take any medication unless instructed by your physician -Make any legal decisions or sign important papers.  Meals: Start with liquid foods such as gelatin or soup. Progress to regular foods as tolerated. Avoid greasy, spicy, heavy foods. If nausea and/or vomiting occur, drink only clear liquids until the nausea and/or vomiting subsides. Call your physician if vomiting continues.  Special Instructions/Symptoms: Your throat may feel dry or sore from the anesthesia or the breathing tube placed in your throat during surgery. If this causes discomfort, gargle with warm salt water. The discomfort should disappear within 24 hours.

## 2022-02-25 NOTE — H&P (Signed)
Office Visit Report     02/22/2022    CC/HPI: Glenard Newmann is a 47 year old male who is seen in consultation today with history of urolithiasis and flank pain.   1. Urolithiasis:  -Pre 2024: METx3, no hx of ESWL or URS  -He developed acute onset of left-sided flank pain on 02/12/2021 associated with small amount of gross hematuria. He has some mild nausea that resolved. He presented to ED on 02/13/2022. No imaging was done. He was discharged home as his pain was controlled. He states that over the past 10 days, he has had intermittent left-sided flank pain. He denies fevers, chills, dysuria.  -CT A/P 02/22/2022: Approximate 6 x 6 mm left proximal ureteral stone with mild left hydronephrosis. Also present are right lower pole stones. His pain today is currently well-controlled.   He has past medical history of hypertension, asthma. He has had a disc replacement.   He works in Engineer, technical sales.     ALLERGIES: Penicillins Sulfa Drugs    MEDICATIONS: Adderall  Amlodipine-Valsartan 5 mg-320 mg tablet 1 tablet PO Daily  Montelukast Sodium 10 mg tablet 1 tablet PO Daily     GU PSH: No GU PSH      PSH Notes: Tonsillectomy, Adenoidectomy, Inner Ear Surgery, disk replacement    NON-GU PSH: Inner Ear Surgery Procedure - 2010 Remove Adenoids - 2010 Remove Tonsils - 2016     GU PMH: Renal calculus, Nephrolithiasis - 2017 Abdominal Pain Unspec, Flank pain, acute - 2016 Ureteral calculus, Calculus of ureter - 2014    NON-GU PMH: Encounter for general adult medical examination without abnormal findings, Encounter for preventive health examination - 2016 Personal history of other diseases of the circulatory system, History of hypertension - 2016 Asthma Hypertension    FAMILY HISTORY: Kidney Stones - Runs In Family Prostate Cancer - Runs in Family   SOCIAL HISTORY: Marital Status: Married Preferred Language: English; Ethnicity: Not Hispanic Or Latino; Race: White Current Smoking Status: Patient has never  smoked.   Tobacco Use Assessment Completed: Used Tobacco in last 30 days? Does not use smokeless tobacco. Has never drank.  Drinks 1 caffeinated drink per day. 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     Notes: Caffeine Use, Alcohol Use, Never a smoker, Occupation:, Tobacco Use, Marital History - Currently Married   REVIEW OF SYSTEMS:    GU Review Male:   Patient reports get up at night to urinate. Patient denies frequent urination, hard to postpone urination, burning/ pain with urination, leakage of urine, stream starts and stops, trouble starting your stream, have to strain to urinate , erection problems, and penile pain.  Gastrointestinal (Upper):   Patient reports nausea. Patient denies vomiting and indigestion/ heartburn.  Gastrointestinal (Lower):   Patient reports constipation. Patient denies diarrhea.  Constitutional:   Patient reports weight loss. Patient denies fever, night sweats, and fatigue.  Skin:   Patient denies skin rash/ lesion and itching.  Eyes:   Patient denies blurred vision and double vision.  Ears/ Nose/ Throat:   Patient denies sore throat and sinus problems.  Hematologic/Lymphatic:   Patient denies swollen glands and easy bruising.  Cardiovascular:   Patient denies leg swelling and chest pains.  Respiratory:   Patient denies cough and shortness of breath.  Endocrine:   Patient denies excessive thirst.  Musculoskeletal:   Patient reports back pain. Patient denies joint pain.  Neurological:   Patient reports headaches. Patient denies dizziness.  Psychologic:   Patient denies depression and anxiety.   Notes: no ua, kidney  stone     VITAL SIGNS:      02/22/2022 02:28 PM  Weight 185 lb / 83.91 kg  Height 67 in / 170.18 cm  BP 123/85 mmHg  Pulse 94 /min  Temperature 97.5 F / 36.3 C  BMI 29.0 kg/m   MULTI-SYSTEM PHYSICAL EXAMINATION:    Constitutional: Well-nourished. No physical deformities. Normally developed. Good grooming.  Respiratory: No labored breathing, no  use of accessory muscles.   Cardiovascular: Normal temperature, normal extremity pulses, no swelling, no varicosities.  Gastrointestinal: No mass, no tenderness, no rigidity, non obese abdomen. No CVA tenderness     Complexity of Data:  Source Of History:  Patient, Medical Record Summary  Records Review:   Previous Doctor Records, Previous Patient Records  Urine Test Review:   Urinalysis  Urodynamics Review:   Review Bladder Scan  X-Ray Review: C.T. Abdomen/Pelvis: Reviewed Films. Reviewed Report. Discussed With Patient.    Notes:                     CLINICAL DATA: Left flank pain. Nephrolithiasis.   EXAM:  CT ABDOMEN AND PELVIS WITHOUT CONTRAST   TECHNIQUE:  Multidetector CT imaging of the abdomen and pelvis was performed  following the standard protocol without IV contrast.   RADIATION DOSE REDUCTION: This exam was performed according to the  departmental dose-optimization program which includes automated  exposure control, adjustment of the mA and/or kV according to  patient size and/or use of iterative reconstruction technique.   COMPARISON: 09/23/2014   FINDINGS:  Lower chest: No acute findings.   Hepatobiliary: No mass visualized on this unenhanced exam.   Pancreas: No mass or inflammatory process visualized on this  unenhanced exam.   Spleen: Within normal limits in size.   Adrenals/Urinary tract: A few tiny less than 5 mm right renal  calculi are seen. Mild left hydroureteronephrosis is seen due to a 4  mm calculus in the proximal left ureter. Unremarkable unopacified  urinary bladder.   Stomach/Bowel: No evidence of obstruction, inflammatory process, or  abnormal fluid collections. Normal appendix visualized.  Diverticulosis is seen mainly involving the sigmoid colon, however  there is no evidence of diverticulitis.   Vascular/Lymphatic: No pathologically enlarged lymph nodes  identified. No evidence of abdominal aortic aneurysm.   Reproductive: No mass or  other significant abnormality.   Other: None.   Musculoskeletal: No suspicious bone lesions identified.   IMPRESSION:  Mild left hydroureteronephrosis due to 4 mm proximal left ureteral  calculus.   Nonobstructing right renal calculi.   Colonic diverticulosis, without radiographic evidence of  diverticulitis.    Electronically Signed  By: Marlaine Hind M.D.  On: 02/22/2022 16:52     PROCEDURES:         C.T. Urogram - [id number]      Patient confirmed No Neulasta OnPro Device.   ASSESSMENT:      ICD-10 Details  1 GU:   Renal calculus - N20.0   2   Flank Pain - R10.84    PLAN:            Medications New Meds: Percocet 5 mg-325 mg tablet 1 tablet PO Q 4 H PRN   #20  0 Refill(s)  Pharmacy Name:  [facility] [id number]  Address:  [place], [place] [address]  Phone:  [phone]  Fax:  [phone]            Orders Labs Urine Culture  X-Rays: C.T.  Stone Protocol Without I.V. Contrast          Schedule         Document Letter(s):  Created for Patient: Clinical Summary         Notes:   1. Urolithiasis:  -CT A/P 02/22/2022: Approximate 6 x 6 mm left proximal ureteral stone with mild left hydronephrosis. Also present are right lower pole stones. Able to see on scout film.  -We discussed options for management he was treated with ESWL. Surgery letter sent. Added-on for next Monday. Return precautions discussed. Prescribed Percocet.   We discussed dietary methods for stone prevention including the following: increased water intake to 2-3 liters per day, add lemon or lemon concentrate to water to increase citrate which is beneficial for stone prevention, limiting dietary sodium to less than 2000 mg per day, limiting animal protein to less than 2 servings (16 ounces/day), and limiting foods high in oxalate content (spinach, beans, chocolate, etc.).   CC: Lelon Frohlich   Urology Preoperative H&P   Chief Complaint: Left ureteral  stone  History of Present Illness: Thomes Corpe is a 47 y.o. male with a left ureteral stone here for left ESWL. Denies fevers, chills, dysuria.    Past Medical History:  Diagnosis Date   ADHD (attention deficit hyperactivity disorder)    ALLERGIC RHINITIS 09/17/2007   Allergy    Asthma    Cataract    forming    Hypertension    NEPHROLITHIASIS, HX OF 10/20/2007   PONV (postoperative nausea and vomiting)    with anesthesia gas    STONE, URINARY CALCULUS,UNSPEC. 09/17/2007    Past Surgical History:  Procedure Laterality Date   patch to ear drum     TONSILLECTOMY     TYMPANOSTOMY TUBE PLACEMENT     x 13     Allergies:  Allergies  Allergen Reactions   Penicillins     REACTION: hives, throat closes   Sulfamethoxazole     REACTION: hives, throat closes    Family History  Problem Relation Age of Onset   Urolithiasis Mother    Leukemia Maternal Uncle    Heart disease Maternal Grandmother    Breast cancer Maternal Grandmother    Lung cancer Maternal Grandmother    Colon cancer Neg Hx    Colon polyps Neg Hx    Esophageal cancer Neg Hx    Stomach cancer Neg Hx    Rectal cancer Neg Hx     Social History:  reports that he has never smoked. He has never used smokeless tobacco. He reports current alcohol use. He reports that he does not use drugs.  ROS: A complete review of systems was performed.  All systems are negative except for pertinent findings as noted.  Physical Exam:  Vital signs in last 24 hours: Temp:  [98.3 F (36.8 C)] 98.3 F (36.8 C) (02/12 0950) Pulse Rate:  [96] 96 (02/12 0950) Resp:  [18] 18 (02/12 0950) BP: (140)/(81) 140/81 (02/12 0950) SpO2:  [100 %] 100 % (02/12 0950) Weight:  [81.2 kg] 81.2 kg (02/12 0950) Constitutional:  Alert and oriented, No acute distress Cardiovascular: Regular rate and rhythm Respiratory: Normal respiratory effort, Lungs clear bilaterally GI: Abdomen is soft, nontender, nondistended, no abdominal masses GU: No  CVA tenderness Lymphatic: No lymphadenopathy Neurologic: Grossly intact, no focal deficits Psychiatric: Normal mood and affect  Laboratory Data:  No results for input(s): "WBC", "HGB", "HCT", "PLT" in the last 72 hours.  No results for input(s): "NA", "K", "  CL", "GLUCOSE", "BUN", "CALCIUM", "CREATININE" in the last 72 hours.  Invalid input(s): "CO3"   No results found for this or any previous visit (from the past 24 hour(s)). No results found for this or any previous visit (from the past 240 hour(s)).  Renal Function: No results for input(s): "CREATININE" in the last 168 hours. CrCl cannot be calculated (Patient's most recent lab result is older than the maximum 21 days allowed.).  Radiologic Imaging: No results found.  I independently reviewed the above imaging studies.  Assessment and Plan Teddy Matthan Kivett is a 47 y.o. male with a left proximal ureteral stone here for left EWSL.  The risks, benefits and alternatives of left ESWL was discussed with the patient. I described the risks which include arrhythmia, kidney contusion, kidney hemorrhage, need for transfusion, back discomfort, flank ecchymosis, flank abrasion, inability to fracture the stone, inability to pass stone fragments, Steinstrasse, infection associated with obstructing stones, need for an alternative surgical procedure and possible need for repeat shockwave lithotripsy.  The patient voices understanding and wishes to proceed.     Matt R. Dhruti Ghuman MD 02/25/2022, 11:13 AM  [facility] Pager: [phone]): [phone]

## 2022-02-26 ENCOUNTER — Encounter (HOSPITAL_BASED_OUTPATIENT_CLINIC_OR_DEPARTMENT_OTHER): Payer: Self-pay | Admitting: Urology

## 2022-02-28 DIAGNOSIS — J301 Allergic rhinitis due to pollen: Secondary | ICD-10-CM | POA: Diagnosis not present

## 2022-02-28 DIAGNOSIS — J3089 Other allergic rhinitis: Secondary | ICD-10-CM | POA: Diagnosis not present

## 2022-02-28 DIAGNOSIS — J3081 Allergic rhinitis due to animal (cat) (dog) hair and dander: Secondary | ICD-10-CM | POA: Diagnosis not present

## 2022-03-15 DIAGNOSIS — N202 Calculus of kidney with calculus of ureter: Secondary | ICD-10-CM | POA: Diagnosis not present

## 2022-03-28 DIAGNOSIS — J301 Allergic rhinitis due to pollen: Secondary | ICD-10-CM | POA: Diagnosis not present

## 2022-03-28 DIAGNOSIS — J3089 Other allergic rhinitis: Secondary | ICD-10-CM | POA: Diagnosis not present

## 2022-03-28 DIAGNOSIS — J3081 Allergic rhinitis due to animal (cat) (dog) hair and dander: Secondary | ICD-10-CM | POA: Diagnosis not present

## 2022-04-25 DIAGNOSIS — J3089 Other allergic rhinitis: Secondary | ICD-10-CM | POA: Diagnosis not present

## 2022-04-25 DIAGNOSIS — J3081 Allergic rhinitis due to animal (cat) (dog) hair and dander: Secondary | ICD-10-CM | POA: Diagnosis not present

## 2022-04-25 DIAGNOSIS — J301 Allergic rhinitis due to pollen: Secondary | ICD-10-CM | POA: Diagnosis not present

## 2022-04-26 DIAGNOSIS — J302 Other seasonal allergic rhinitis: Secondary | ICD-10-CM | POA: Diagnosis not present

## 2022-05-15 DIAGNOSIS — N200 Calculus of kidney: Secondary | ICD-10-CM | POA: Diagnosis not present

## 2022-05-27 DIAGNOSIS — J3081 Allergic rhinitis due to animal (cat) (dog) hair and dander: Secondary | ICD-10-CM | POA: Diagnosis not present

## 2022-05-27 DIAGNOSIS — J3089 Other allergic rhinitis: Secondary | ICD-10-CM | POA: Diagnosis not present

## 2022-05-27 DIAGNOSIS — J301 Allergic rhinitis due to pollen: Secondary | ICD-10-CM | POA: Diagnosis not present

## 2022-05-28 ENCOUNTER — Other Ambulatory Visit: Payer: Self-pay | Admitting: Internal Medicine

## 2022-05-28 DIAGNOSIS — F902 Attention-deficit hyperactivity disorder, combined type: Secondary | ICD-10-CM

## 2022-05-29 ENCOUNTER — Encounter: Payer: Self-pay | Admitting: Internal Medicine

## 2022-05-29 ENCOUNTER — Telehealth: Payer: BC Managed Care – PPO | Admitting: Internal Medicine

## 2022-05-29 DIAGNOSIS — F902 Attention-deficit hyperactivity disorder, combined type: Secondary | ICD-10-CM

## 2022-05-29 MED ORDER — AMPHETAMINE-DEXTROAMPHET ER 20 MG PO CP24: 20.0000 mg | ORAL_CAPSULE | Freq: Every day | ORAL | 0 refills | Status: DC

## 2022-05-29 MED ORDER — AMPHETAMINE-DEXTROAMPHET ER 20 MG PO CP24: 20.0000 mg | ORAL_CAPSULE | Freq: Every morning | ORAL | 0 refills | Status: DC

## 2022-05-29 NOTE — Progress Notes (Signed)
Virtual Visit via Video Note  I connected with Adam Terry on 05/29/22 at  1:30 PM EDT by a video enabled telemedicine application and verified that I am speaking with the correct person using two identifiers.  Location patient: home Location provider: work office Persons participating in the virtual visit: patient, provider  I discussed the limitations of evaluation and management by telemedicine and the availability of in person appointments. The patient expressed understanding and agreed to proceed.   HPI: He is scheduled this visit for the purpose of medication refills.  He takes Adderall 20 mg daily for his adult ADHD.  He is tolerating the medication well without any side effects.   ROS: Negative unless indicated in HPI.  Past Medical History:  Diagnosis Date   ADHD (attention deficit hyperactivity disorder)    ALLERGIC RHINITIS 09/17/2007   Allergy    Asthma    Cataract    forming    Hypertension    NEPHROLITHIASIS, HX OF 10/20/2007   PONV (postoperative nausea and vomiting)    with anesthesia gas    STONE, URINARY CALCULUS,UNSPEC. 09/17/2007    Past Surgical History:  Procedure Laterality Date   EXTRACORPOREAL SHOCK WAVE LITHOTRIPSY Left 02/25/2022   Procedure: LEFT EXTRACORPOREAL SHOCK WAVE LITHOTRIPSY (ESWL);  Surgeon: Jannifer Hick, MD;  Location: [facility];  Service: Urology;  Laterality: Left;  75 MINS   patch to ear drum     TONSILLECTOMY     TYMPANOSTOMY TUBE PLACEMENT     x 13     Family History  Problem Relation Age of Onset   Urolithiasis Mother    Leukemia Maternal Uncle    Heart disease Maternal Grandmother    Breast cancer Maternal Grandmother    Lung cancer Maternal Grandmother    Colon cancer Neg Hx    Colon polyps Neg Hx    Esophageal cancer Neg Hx    Stomach cancer Neg Hx    Rectal cancer Neg Hx     SOCIAL HX:   reports that he has never smoked. He has never used smokeless tobacco. He reports current  alcohol use. He reports that he does not use drugs.   Current Outpatient Medications:    albuterol (PROVENTIL HFA;VENTOLIN HFA) 108 (90 Base) MCG/ACT inhaler, Inhale 2 puffs into the lungs every 6 (six) hours as needed for wheezing or shortness of breath., Disp: 1 Inhaler, Rfl: 0   amLODipine-valsartan (EXFORGE) 5-320 MG tablet, TAKE ONE TABLET BY MOUTH DAILY, Disp: 90 tablet, Rfl: 1   amphetamine-dextroamphetamine (ADDERALL XR) 20 MG 24 hr capsule, Take 1 capsule (20 mg total) by mouth daily., Disp: 30 capsule, Rfl: 0   amphetamine-dextroamphetamine (ADDERALL XR) 20 MG 24 hr capsule, Take 1 capsule (20 mg total) by mouth every morning., Disp: 30 capsule, Rfl: 0   amphetamine-dextroamphetamine (ADDERALL XR) 20 MG 24 hr capsule, Take 1 capsule (20 mg total) by mouth daily., Disp: 30 capsule, Rfl: 0   aspirin-acetaminophen-caffeine (EXCEDRIN MIGRAINE) 250-250-65 MG per tablet, Take 1 tablet by mouth every 6 (six) hours as needed for headache., Disp: , Rfl:    azelastine (ASTELIN) 0.1 % nasal spray, Place 2 sprays into both nostrils 2 (two) times daily. Use in each nostril as directed, Disp: 30 mL, Rfl: 12   budesonide-formoterol (SYMBICORT) 160-4.5 MCG/ACT inhaler, Inhale 2 puffs into the lungs 2 (two) times daily., Disp: 1 Inhaler, Rfl: 3   EPINEPHrine 0.3 mg/0.3 mL IJ SOAJ injection, See admin instructions., Disp: , Rfl:  fluticasone (FLONASE SENSIMIST) 27.5 MCG/SPRAY nasal spray, 2 sprays each nostril, Disp: , Rfl:    ketotifen (ZADITOR) 0.025 % ophthalmic solution, 1 drop into affected eye, Disp: , Rfl:    levocetirizine (XYZAL) 5 MG tablet, Take 1 tablet (5 mg total) by mouth daily. (Patient taking differently: Take 5 mg by mouth as needed.), Disp: 60 tablet, Rfl: 4   montelukast (SINGULAIR) 10 MG tablet, Take 1 tablet (10 mg total) by mouth at bedtime., Disp: 30 tablet, Rfl: 6   predniSONE (STERAPRED UNI-PAK 21 TAB) 10 MG (21) TBPK tablet, Take as directed, Disp: 21 tablet, Rfl: 0    Spacer/Aero-Holding Chambers (AEROCHAMBER MAX W/FLOW-VU) MISC, See admin instructions., Disp: , Rfl:   EXAM:   VITALS per patient if applicable: None reported  GENERAL: alert, oriented, appears well and in no acute distress  HEENT: atraumatic, conjunttiva clear, no obvious abnormalities on inspection of external nose and ears  NECK: normal movements of the head and neck  LUNGS: on inspection no signs of respiratory distress, breathing rate appears normal, no obvious gross increased work of breathing, gasping or wheezing  CV: no obvious cyanosis  MS: moves all visible extremities without noticeable abnormality  PSYCH/NEURO: pleasant and cooperative, no obvious depression or anxiety, speech and thought processing grossly intact  ASSESSMENT AND PLAN:   Attention deficit hyperactivity disorder (ADHD), combined type - Plan: amphetamine-dextroamphetamine (ADDERALL XR) 20 MG 24 hr capsule, amphetamine-dextroamphetamine (ADDERALL XR) 20 MG 24 hr capsule, amphetamine-dextroamphetamine (ADDERALL XR) 20 MG 24 hr capsule  -PDMP reviewed, no red flags, overdose risk score is 370.  His ORS is higher than usual given multiple narcotic dispensations he had earlier this year while dealing with a kidney stone. -Refill Adderall XR 20 mg to take 1 tablet daily for total of 30 tablets a month x 3 months.    I discussed the assessment and treatment plan with the patient. The patient was provided an opportunity to ask questions and all were answered. The patient agreed with the plan and demonstrated an understanding of the instructions.   The patient was advised to call back or seek an in-person evaluation if the symptoms worsen or if the condition fails to improve as anticipated.    Adam Jan, MD  [facility] at [facility]

## 2022-06-04 DIAGNOSIS — J301 Allergic rhinitis due to pollen: Secondary | ICD-10-CM | POA: Diagnosis not present

## 2022-06-04 DIAGNOSIS — J3089 Other allergic rhinitis: Secondary | ICD-10-CM | POA: Diagnosis not present

## 2022-06-04 DIAGNOSIS — J3081 Allergic rhinitis due to animal (cat) (dog) hair and dander: Secondary | ICD-10-CM | POA: Diagnosis not present

## 2022-06-11 DIAGNOSIS — J3081 Allergic rhinitis due to animal (cat) (dog) hair and dander: Secondary | ICD-10-CM | POA: Diagnosis not present

## 2022-06-11 DIAGNOSIS — J3089 Other allergic rhinitis: Secondary | ICD-10-CM | POA: Diagnosis not present

## 2022-06-11 DIAGNOSIS — J301 Allergic rhinitis due to pollen: Secondary | ICD-10-CM | POA: Diagnosis not present

## 2022-06-19 DIAGNOSIS — J301 Allergic rhinitis due to pollen: Secondary | ICD-10-CM | POA: Diagnosis not present

## 2022-06-19 DIAGNOSIS — J3081 Allergic rhinitis due to animal (cat) (dog) hair and dander: Secondary | ICD-10-CM | POA: Diagnosis not present

## 2022-06-19 DIAGNOSIS — J3089 Other allergic rhinitis: Secondary | ICD-10-CM | POA: Diagnosis not present

## 2022-06-20 DIAGNOSIS — N200 Calculus of kidney: Secondary | ICD-10-CM | POA: Diagnosis not present

## 2022-06-26 DIAGNOSIS — J301 Allergic rhinitis due to pollen: Secondary | ICD-10-CM | POA: Diagnosis not present

## 2022-06-26 DIAGNOSIS — J3089 Other allergic rhinitis: Secondary | ICD-10-CM | POA: Diagnosis not present

## 2022-06-26 DIAGNOSIS — J3081 Allergic rhinitis due to animal (cat) (dog) hair and dander: Secondary | ICD-10-CM | POA: Diagnosis not present

## 2022-07-03 DIAGNOSIS — J3089 Other allergic rhinitis: Secondary | ICD-10-CM | POA: Diagnosis not present

## 2022-07-03 DIAGNOSIS — J301 Allergic rhinitis due to pollen: Secondary | ICD-10-CM | POA: Diagnosis not present

## 2022-07-03 DIAGNOSIS — J3081 Allergic rhinitis due to animal (cat) (dog) hair and dander: Secondary | ICD-10-CM | POA: Diagnosis not present

## 2022-07-10 DIAGNOSIS — J3089 Other allergic rhinitis: Secondary | ICD-10-CM | POA: Diagnosis not present

## 2022-07-10 DIAGNOSIS — J3081 Allergic rhinitis due to animal (cat) (dog) hair and dander: Secondary | ICD-10-CM | POA: Diagnosis not present

## 2022-07-10 DIAGNOSIS — J301 Allergic rhinitis due to pollen: Secondary | ICD-10-CM | POA: Diagnosis not present

## 2022-08-08 DIAGNOSIS — J301 Allergic rhinitis due to pollen: Secondary | ICD-10-CM | POA: Diagnosis not present

## 2022-08-08 DIAGNOSIS — J3081 Allergic rhinitis due to animal (cat) (dog) hair and dander: Secondary | ICD-10-CM | POA: Diagnosis not present

## 2022-08-08 DIAGNOSIS — J3089 Other allergic rhinitis: Secondary | ICD-10-CM | POA: Diagnosis not present

## 2022-08-30 DIAGNOSIS — D485 Neoplasm of uncertain behavior of skin: Secondary | ICD-10-CM | POA: Diagnosis not present

## 2022-08-30 DIAGNOSIS — D2261 Melanocytic nevi of right upper limb, including shoulder: Secondary | ICD-10-CM | POA: Diagnosis not present

## 2022-08-30 DIAGNOSIS — Z1283 Encounter for screening for malignant neoplasm of skin: Secondary | ICD-10-CM | POA: Diagnosis not present

## 2022-08-30 DIAGNOSIS — D225 Melanocytic nevi of trunk: Secondary | ICD-10-CM | POA: Diagnosis not present

## 2022-09-05 DIAGNOSIS — J301 Allergic rhinitis due to pollen: Secondary | ICD-10-CM | POA: Diagnosis not present

## 2022-09-05 DIAGNOSIS — J3081 Allergic rhinitis due to animal (cat) (dog) hair and dander: Secondary | ICD-10-CM | POA: Diagnosis not present

## 2022-09-05 DIAGNOSIS — J3089 Other allergic rhinitis: Secondary | ICD-10-CM | POA: Diagnosis not present

## 2022-09-27 DIAGNOSIS — D485 Neoplasm of uncertain behavior of skin: Secondary | ICD-10-CM | POA: Diagnosis not present

## 2022-09-27 DIAGNOSIS — L988 Other specified disorders of the skin and subcutaneous tissue: Secondary | ICD-10-CM | POA: Diagnosis not present

## 2022-10-03 DIAGNOSIS — J3089 Other allergic rhinitis: Secondary | ICD-10-CM | POA: Diagnosis not present

## 2022-10-03 DIAGNOSIS — J301 Allergic rhinitis due to pollen: Secondary | ICD-10-CM | POA: Diagnosis not present

## 2022-10-03 DIAGNOSIS — J3081 Allergic rhinitis due to animal (cat) (dog) hair and dander: Secondary | ICD-10-CM | POA: Diagnosis not present

## 2022-10-09 ENCOUNTER — Other Ambulatory Visit: Payer: Self-pay | Admitting: Internal Medicine

## 2022-10-09 DIAGNOSIS — F902 Attention-deficit hyperactivity disorder, combined type: Secondary | ICD-10-CM

## 2022-10-15 MED ORDER — AMPHETAMINE-DEXTROAMPHET ER 20 MG PO CP24: 20.0000 mg | ORAL_CAPSULE | Freq: Every day | ORAL | 0 refills | Status: DC

## 2022-10-25 IMAGING — MR MR CERVICAL SPINE W/O CM
5 series · 34 of 48 positions shown · non-contrast
Comparison: MRI cervical spine 06/26/2013.

CLINICAL DATA: Cervical spinal stenosis.

EXAM:
MRI CERVICAL SPINE WITHOUT CONTRAST
TECHNIQUE: Multiplanar, multisequence MR imaging of the cervical spine was
performed. No intravenous contrast was administered.

[Series 2: T2 · sagittal · 3.0mm · 0.41mm/px · 8 of 17 slices shown (1 of 2)]
[im 1/17]
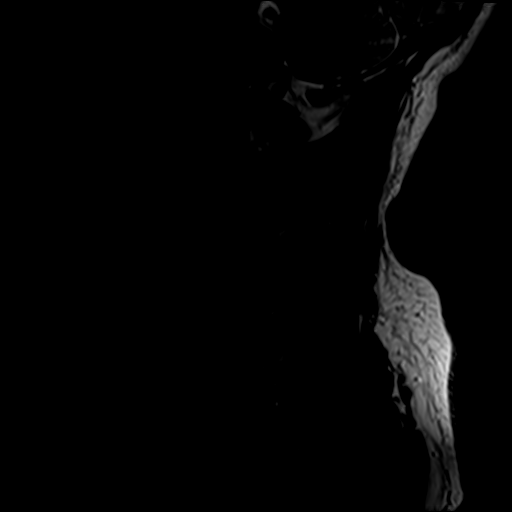
[im 3/17]
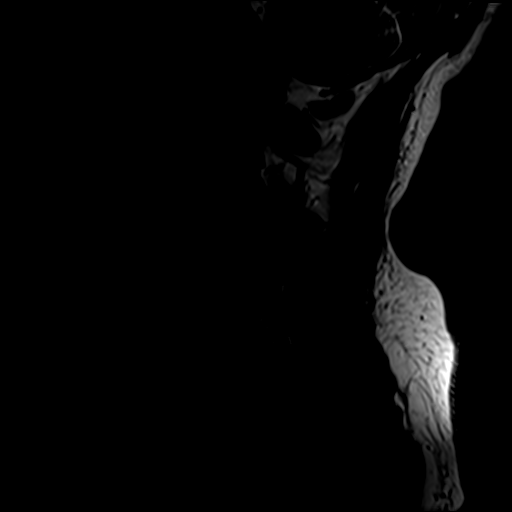
[im 5/17]
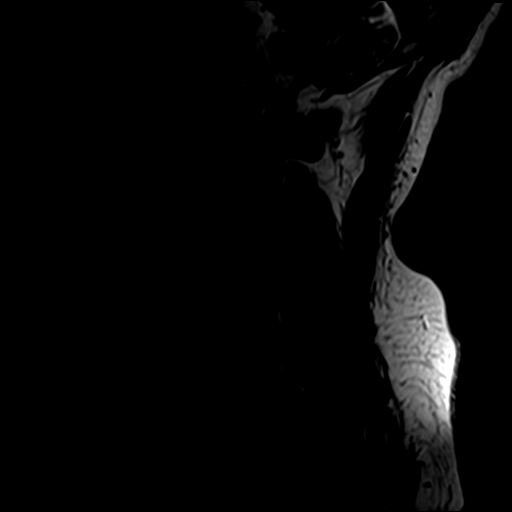
[im 7/17]
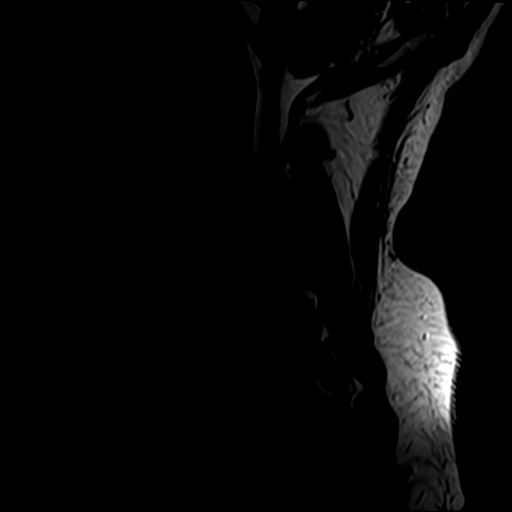
[im 10/17]
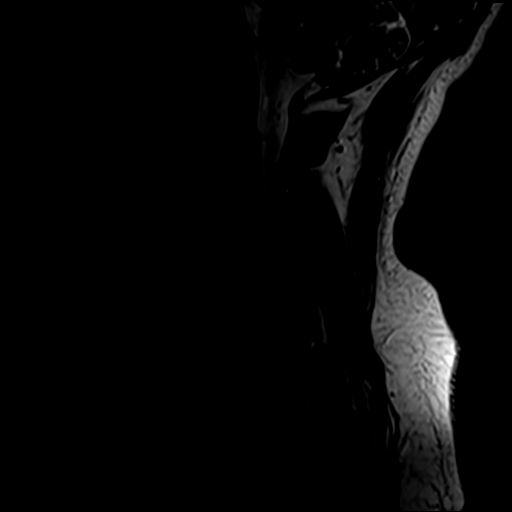
[im 12/17]
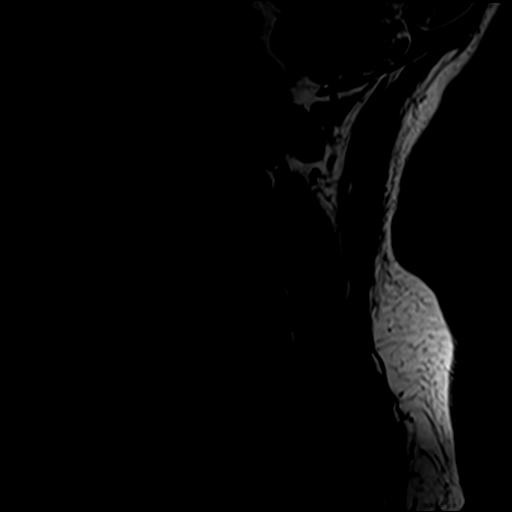
[im 14/17]
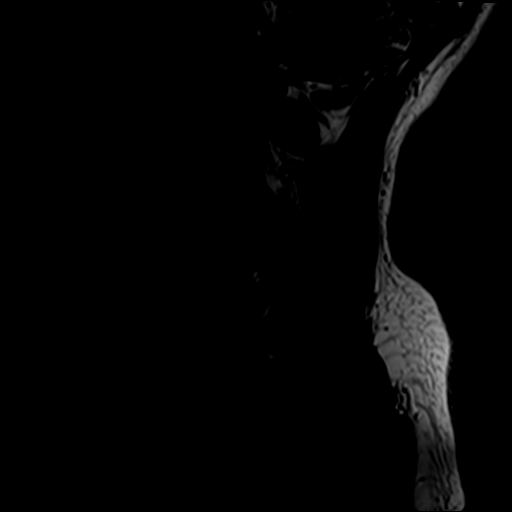
[im 17/17]
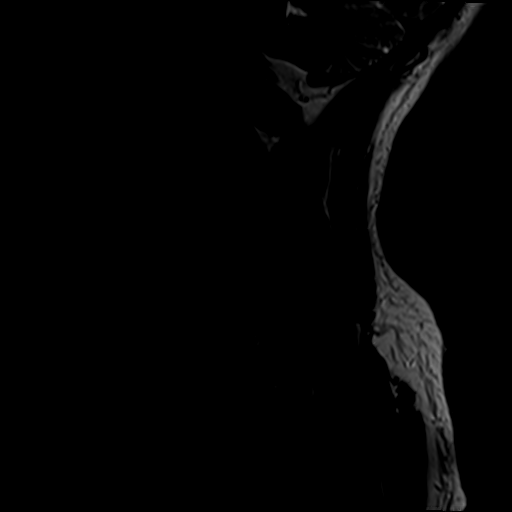

[Series 3: STIR · sagittal · 3.0mm · 0.82mm/px · 7 of 17 slices shown]
[im 1/17]
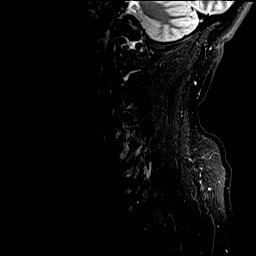
[im 3/17]
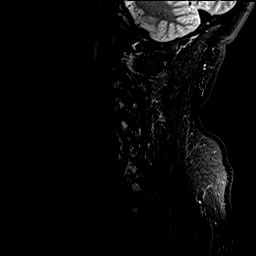
[im 6/17]
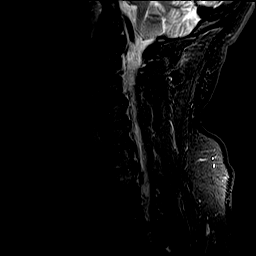
[im 9/17]
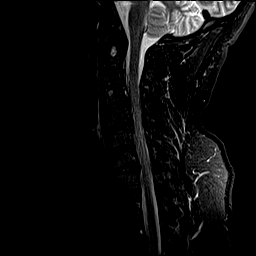
[im 11/17]
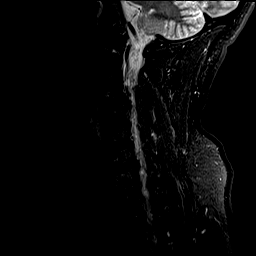
[im 14/17]
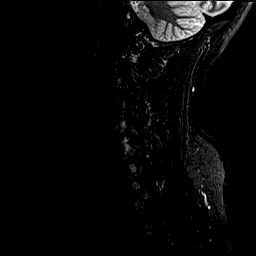
[im 17/17]
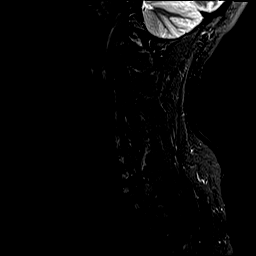

[Series 4: T1 · sagittal · 3.0mm · 0.82mm/px · 7 of 17 slices shown]
[im 1/17]
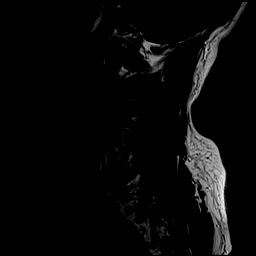
[im 3/17]
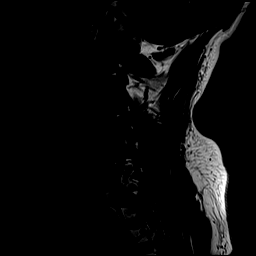
[im 6/17]
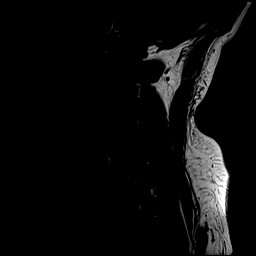
[im 9/17]
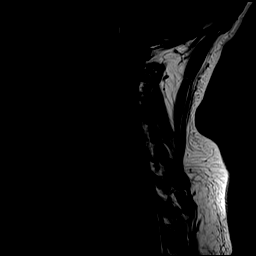
[im 11/17]
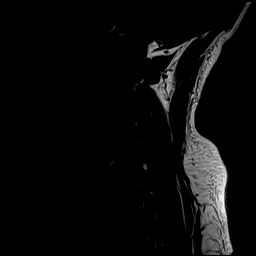
[im 14/17]
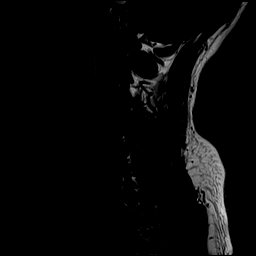
[im 17/17]
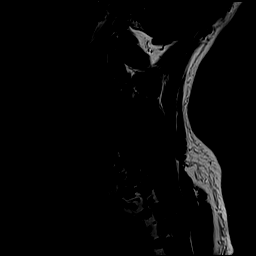

[Series 6: GRE · axial · 3.0mm · 0.35mm/px · z∈[-71,-38]mm · 3 of 29 slices shown]
[im 1/29]
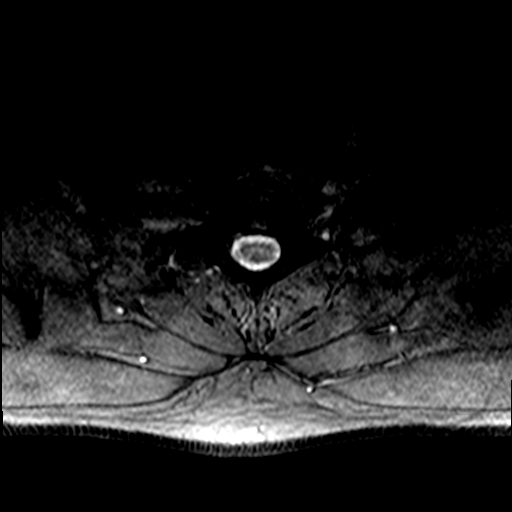
[im 5/29]
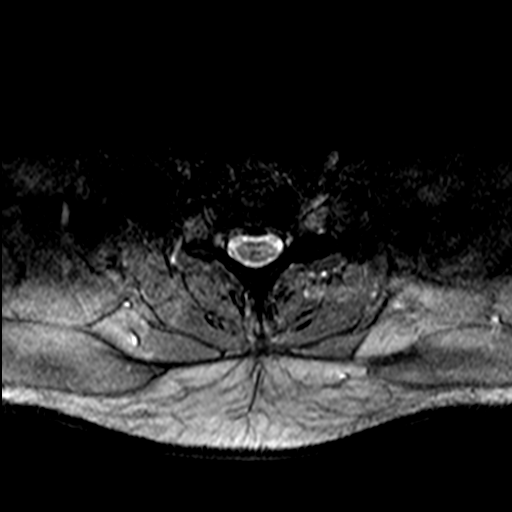
[im 10/29]
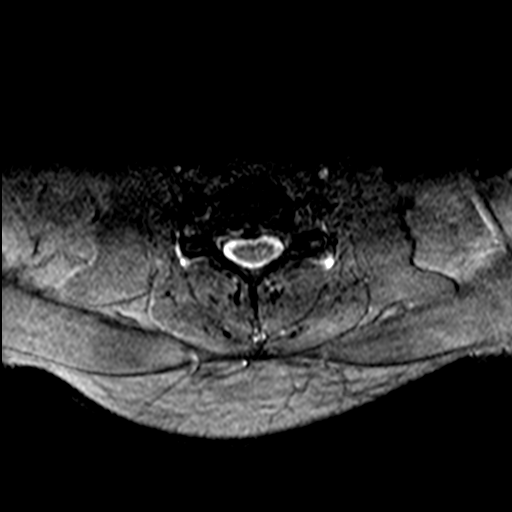

[Series 7: T2 · axial · 3.0mm · 0.70mm/px · z∈[-71,+31]mm · 9 of 29 slices shown (2 of 2)]
[im 1/29]
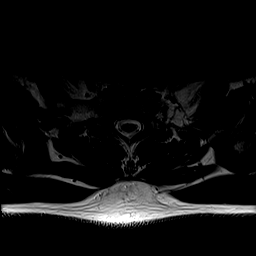
[im 5/29]
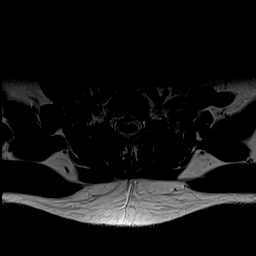
[im 10/29]
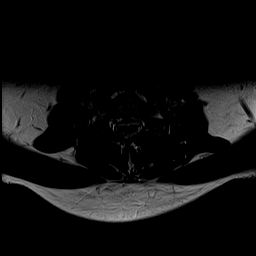
[im 12/29]
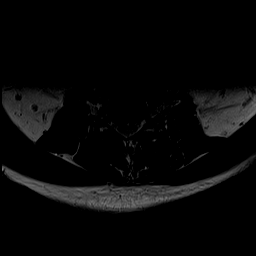
[im 15/29]
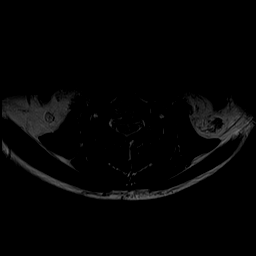
[im 17/29]
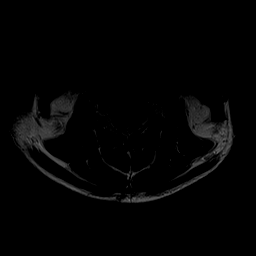
[im 19/29]
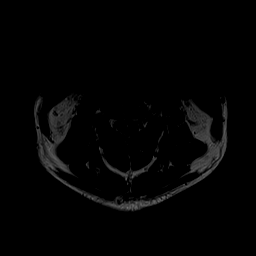
[im 24/29]
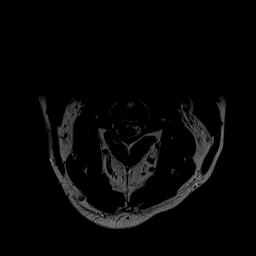
[im 29/29]
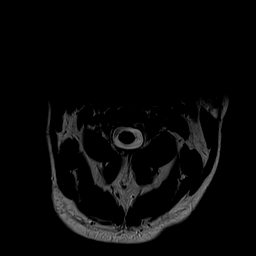

[34 of 48 positions shown; findings below may reference images not displayed]

FINDINGS: Motion limited examination.

Alignment: Straightening of the normal cervical lordosis without
substantial subluxation.

Vertebrae: Vertebral body heights are maintained. Similar vertebral
venous malformations.

Cord: Normal cord signal.

Posterior Fossa, vertebral arteries, paraspinal tissues:
Unremarkable.

Disc levels:

C2-C3: Similar left greater than right uncovertebral and facet
hypertrophy with mild left foraminal stenosis. No significant right
foraminal stenosis or canal stenosis.

C3-C4: Interval increase in the size lobulated left eccentric
posterior disc osteophyte complex, which contacts and mildly deforms
left eccentric cord. Overall, mild to moderate canal stenosis. Left
greater than right facet and uncovertebral hypertrophy with
resulting mild to moderate left and mild right foraminal stenosis.

C4-C5: Interval increase in the size of a posterior disc osteophyte
complex which contacts and mildly deforms the ventral cord with
overall mild to moderate canal stenosis. Bilateral uncovertebral
hypertrophy with mild bilateral foraminal stenosis.

C5-C6: Interval increase in size of a right eccentric posterior disc
osteophyte complex which contacts and mildly deforms the right
eccentric ventral cord with overall mild canal stenosis. Right
greater than left facet and uncovertebral hypertrophy with mild
right foraminal stenosis.

C6-C7: Posterior disc osteophyte complex, similar to prior. Mild
canal stenosis, similar to prior. Bilateral facet and uncovertebral
hypertrophy with similar bilateral moderate foraminal stenosis.

C7-T1: Small posterior disc osteophyte complex without significant
canal or foraminal stenosis.

Posterior disc bulge at T2-T3 is only imaged on sagittal sequences
without evidence of significant canal stenosis.
IMPRESSION: 1. Slight interval increase in the size of posterior disc osteophyte
complexes at C3-C4 and C4-C5, which contact and mildly indent the
ventral cord with overall mild to moderate canal stenosis at these
levels.
2. Slight interval increase in a right eccentric posterior disc
osteophyte complex at C5-C6 which contacts and mildly indents the
right ventral cord with overall mild canal stenosis at this level.
3. Similar mild canal stenosis at C6-C7.
4. No substantial change in multilevel foraminal stenosis, including
moderate foraminal stenosis bilaterally C6-C7 and mild to moderate
bilateral foraminal stenosis C3-C4. Multilevel mild foraminal
stenosis is detailed above.

## 2022-10-31 DIAGNOSIS — Z23 Encounter for immunization: Secondary | ICD-10-CM | POA: Diagnosis not present

## 2022-10-31 DIAGNOSIS — J301 Allergic rhinitis due to pollen: Secondary | ICD-10-CM | POA: Diagnosis not present

## 2022-10-31 DIAGNOSIS — J3081 Allergic rhinitis due to animal (cat) (dog) hair and dander: Secondary | ICD-10-CM | POA: Diagnosis not present

## 2022-10-31 DIAGNOSIS — J3089 Other allergic rhinitis: Secondary | ICD-10-CM | POA: Diagnosis not present

## 2022-11-15 DIAGNOSIS — D2271 Melanocytic nevi of right lower limb, including hip: Secondary | ICD-10-CM | POA: Diagnosis not present

## 2022-11-15 DIAGNOSIS — D225 Melanocytic nevi of trunk: Secondary | ICD-10-CM | POA: Diagnosis not present

## 2022-11-15 DIAGNOSIS — D485 Neoplasm of uncertain behavior of skin: Secondary | ICD-10-CM | POA: Diagnosis not present

## 2022-11-22 DIAGNOSIS — J3081 Allergic rhinitis due to animal (cat) (dog) hair and dander: Secondary | ICD-10-CM | POA: Diagnosis not present

## 2022-11-22 DIAGNOSIS — J302 Other seasonal allergic rhinitis: Secondary | ICD-10-CM | POA: Diagnosis not present

## 2022-11-22 DIAGNOSIS — J301 Allergic rhinitis due to pollen: Secondary | ICD-10-CM | POA: Diagnosis not present

## 2022-11-22 DIAGNOSIS — J4540 Moderate persistent asthma, uncomplicated: Secondary | ICD-10-CM | POA: Diagnosis not present

## 2022-11-28 DIAGNOSIS — J301 Allergic rhinitis due to pollen: Secondary | ICD-10-CM | POA: Diagnosis not present

## 2022-11-28 DIAGNOSIS — J3081 Allergic rhinitis due to animal (cat) (dog) hair and dander: Secondary | ICD-10-CM | POA: Diagnosis not present

## 2022-11-28 DIAGNOSIS — J3089 Other allergic rhinitis: Secondary | ICD-10-CM | POA: Diagnosis not present

## 2022-12-02 ENCOUNTER — Other Ambulatory Visit: Payer: Self-pay | Admitting: Adult Health

## 2022-12-02 DIAGNOSIS — F902 Attention-deficit hyperactivity disorder, combined type: Secondary | ICD-10-CM

## 2022-12-02 MED ORDER — AMPHETAMINE-DEXTROAMPHET ER 20 MG PO CP24: 20.0000 mg | ORAL_CAPSULE | Freq: Every day | ORAL | 0 refills | Status: DC

## 2022-12-02 NOTE — Telephone Encounter (Signed)
Office visit scheduled 12/19/22

## 2022-12-05 DIAGNOSIS — J301 Allergic rhinitis due to pollen: Secondary | ICD-10-CM | POA: Diagnosis not present

## 2022-12-05 DIAGNOSIS — J3081 Allergic rhinitis due to animal (cat) (dog) hair and dander: Secondary | ICD-10-CM | POA: Diagnosis not present

## 2022-12-06 DIAGNOSIS — J302 Other seasonal allergic rhinitis: Secondary | ICD-10-CM | POA: Diagnosis not present

## 2022-12-19 ENCOUNTER — Encounter: Payer: Self-pay | Admitting: Internal Medicine

## 2022-12-19 ENCOUNTER — Ambulatory Visit (INDEPENDENT_AMBULATORY_CARE_PROVIDER_SITE_OTHER): Payer: BC Managed Care – PPO | Admitting: Internal Medicine

## 2022-12-19 ENCOUNTER — Encounter: Payer: BC Managed Care – PPO | Admitting: Internal Medicine

## 2022-12-19 VITALS — BP 102/78 | HR 74 | Temp 97.7°F | Ht 67.5 in | Wt 183.8 lb

## 2022-12-19 DIAGNOSIS — Z125 Encounter for screening for malignant neoplasm of prostate: Secondary | ICD-10-CM | POA: Diagnosis not present

## 2022-12-19 DIAGNOSIS — E559 Vitamin D deficiency, unspecified: Secondary | ICD-10-CM

## 2022-12-19 DIAGNOSIS — F902 Attention-deficit hyperactivity disorder, combined type: Secondary | ICD-10-CM | POA: Diagnosis not present

## 2022-12-19 DIAGNOSIS — Z1159 Encounter for screening for other viral diseases: Secondary | ICD-10-CM

## 2022-12-19 DIAGNOSIS — Z114 Encounter for screening for human immunodeficiency virus [HIV]: Secondary | ICD-10-CM

## 2022-12-19 DIAGNOSIS — I10 Essential (primary) hypertension: Secondary | ICD-10-CM

## 2022-12-19 DIAGNOSIS — Z23 Encounter for immunization: Secondary | ICD-10-CM

## 2022-12-19 DIAGNOSIS — Z0000 Encounter for general adult medical examination without abnormal findings: Secondary | ICD-10-CM | POA: Diagnosis not present

## 2022-12-19 LAB — CBC WITH DIFFERENTIAL/PLATELET
Basophils Absolute: 0 10*3/uL (ref 0.0–0.1)
Basophils Relative: 0.7 % (ref 0.0–3.0)
Eosinophils Absolute: 0.1 10*3/uL (ref 0.0–0.7)
Eosinophils Relative: 1.7 % (ref 0.0–5.0)
HCT: 44.6 % (ref 39.0–52.0)
Hemoglobin: 15.2 g/dL (ref 13.0–17.0)
Lymphocytes Relative: 29.5 % (ref 12.0–46.0)
Lymphs Abs: 1.4 10*3/uL (ref 0.7–4.0)
MCHC: 34 g/dL (ref 30.0–36.0)
MCV: 93 fL (ref 78.0–100.0)
Monocytes Absolute: 0.4 10*3/uL (ref 0.1–1.0)
Monocytes Relative: 8.6 % (ref 3.0–12.0)
Neutro Abs: 2.8 10*3/uL (ref 1.4–7.7)
Neutrophils Relative %: 59.5 % (ref 43.0–77.0)
Platelets: 218 10*3/uL (ref 150.0–400.0)
RBC: 4.8 Mil/uL (ref 4.22–5.81)
RDW: 13.3 % (ref 11.5–15.5)
WBC: 4.8 10*3/uL (ref 4.0–10.5)

## 2022-12-19 LAB — VITAMIN D 25 HYDROXY (VIT D DEFICIENCY, FRACTURES): VITD: 31.61 ng/mL (ref 30.00–100.00)

## 2022-12-19 LAB — COMPREHENSIVE METABOLIC PANEL
ALT: 15 U/L (ref 0–53)
AST: 20 U/L (ref 0–37)
Albumin: 4.8 g/dL (ref 3.5–5.2)
Alkaline Phosphatase: 60 U/L (ref 39–117)
BUN: 15 mg/dL (ref 6–23)
CO2: 31 meq/L (ref 19–32)
Calcium: 9.5 mg/dL (ref 8.4–10.5)
Chloride: 101 meq/L (ref 96–112)
Creatinine, Ser: 1.17 mg/dL (ref 0.40–1.50)
GFR: 73.28 mL/min (ref >60.00)
Glucose, Bld: 97 mg/dL (ref 70–99)
Potassium: 4.6 meq/L (ref 3.5–5.1)
Sodium: 138 meq/L (ref 135–145)
Total Bilirubin: 0.8 mg/dL (ref 0.2–1.2)
Total Protein: 7.7 g/dL (ref 6.0–8.3)

## 2022-12-19 LAB — LIPID PANEL
Cholesterol: 171 mg/dL (ref 0–200)
HDL: 40.4 mg/dL (ref >39.00)
LDL Cholesterol: 116 mg/dL — ABNORMAL HIGH (ref 0–99)
NonHDL: 130.2
Total CHOL/HDL Ratio: 4
Triglycerides: 71 mg/dL (ref 0.0–149.0)
VLDL: 14.2 mg/dL (ref 0.0–40.0)

## 2022-12-19 LAB — PSA: PSA: 1.7 ng/mL (ref 0.10–4.00)

## 2022-12-19 LAB — TSH: TSH: 1.3 u[IU]/mL (ref 0.35–5.50)

## 2022-12-19 LAB — VITAMIN B12: Vitamin B-12: 226 pg/mL (ref 211–911)

## 2022-12-19 MED ORDER — AMLODIPINE BESYLATE-VALSARTAN 5-320 MG PO TABS: 1.0000 | ORAL_TABLET | Freq: Every day | ORAL | 1 refills | Status: DC

## 2022-12-19 NOTE — Progress Notes (Signed)
Established Patient Office Visit     CC/Reason for Visit: Annual preventive exam  HPI: Adam Terry is a 47 y.o. male who is coming in today for the above mentioned reasons. Past Medical History is significant for: Vitamin D deficiency, hypertension, ADHD, asthma.  Has been feeling well and has no acute concerns or complaints.  Has routine eye and dental care.  He will be scheduling an appointment with his urologist to discuss a vasectomy.  He is due for flu vaccine.  He had a colonoscopy in 2022.   Past Medical/Surgical History: Past Medical History:  Diagnosis Date   ADHD (attention deficit hyperactivity disorder)    ALLERGIC RHINITIS 09/17/2007   Allergy    Asthma    Cataract    forming    Hypertension    NEPHROLITHIASIS, HX OF 10/20/2007   PONV (postoperative nausea and vomiting)    with anesthesia gas    STONE, URINARY CALCULUS,UNSPEC. 09/17/2007    Past Surgical History:  Procedure Laterality Date   EXTRACORPOREAL SHOCK WAVE LITHOTRIPSY Left 02/25/2022   Procedure: LEFT EXTRACORPOREAL SHOCK WAVE LITHOTRIPSY (ESWL);  Surgeon: Jannifer Hick, MD;  Location: [facility];  Service: Urology;  Laterality: Left;  75 MINS   patch to ear drum     TONSILLECTOMY     TYMPANOSTOMY TUBE PLACEMENT     x 13     Social History:  reports that he has never smoked. He has never used smokeless tobacco. He reports current alcohol use. He reports that he does not use drugs.  Allergies: Allergies  Allergen Reactions   Penicillins     REACTION: hives, throat closes   Sulfamethoxazole     REACTION: hives, throat closes    Family History:  Family History  Problem Relation Age of Onset   Urolithiasis Mother    Leukemia Maternal Uncle    Heart disease Maternal Grandmother    Breast cancer Maternal Grandmother    Lung cancer Maternal Grandmother    Colon cancer Neg Hx    Colon polyps Neg Hx    Esophageal cancer Neg Hx    Stomach cancer Neg Hx    Rectal  cancer Neg Hx      Current Outpatient Medications:    albuterol (PROVENTIL HFA;VENTOLIN HFA) 108 (90 Base) MCG/ACT inhaler, Inhale 2 puffs into the lungs every 6 (six) hours as needed for wheezing or shortness of breath., Disp: 1 Inhaler, Rfl: 0   amphetamine-dextroamphetamine (ADDERALL XR) 20 MG 24 hr capsule, Take 1 capsule (20 mg total) by mouth every morning., Disp: 30 capsule, Rfl: 0   amphetamine-dextroamphetamine (ADDERALL XR) 20 MG 24 hr capsule, Take 1 capsule (20 mg total) by mouth daily., Disp: 30 capsule, Rfl: 0   amphetamine-dextroamphetamine (ADDERALL XR) 20 MG 24 hr capsule, Take 1 capsule (20 mg total) by mouth daily., Disp: 30 capsule, Rfl: 0   aspirin-acetaminophen-caffeine (EXCEDRIN MIGRAINE) 250-250-65 MG per tablet, Take 1 tablet by mouth every 6 (six) hours as needed for headache., Disp: , Rfl:    azelastine (ASTELIN) 0.1 % nasal spray, Place 2 sprays into both nostrils 2 (two) times daily. Use in each nostril as directed, Disp: 30 mL, Rfl: 12   budesonide-formoterol (SYMBICORT) 160-4.5 MCG/ACT inhaler, Inhale 2 puffs into the lungs 2 (two) times daily., Disp: 1 Inhaler, Rfl: 3   EPINEPHrine 0.3 mg/0.3 mL IJ SOAJ injection, See admin instructions., Disp: , Rfl:    fluticasone (FLONASE SENSIMIST) 27.5 MCG/SPRAY nasal spray, 2 sprays each  nostril, Disp: , Rfl:    ketotifen (ZADITOR) 0.025 % ophthalmic solution, 1 drop into affected eye, Disp: , Rfl:    levocetirizine (XYZAL) 5 MG tablet, Take 1 tablet (5 mg total) by mouth daily. (Patient taking differently: Take 5 mg by mouth as needed.), Disp: 60 tablet, Rfl: 4   montelukast (SINGULAIR) 10 MG tablet, Take 1 tablet (10 mg total) by mouth at bedtime., Disp: 30 tablet, Rfl: 6   Spacer/Aero-Holding Chambers (AEROCHAMBER MAX W/FLOW-VU) MISC, See admin instructions., Disp: , Rfl:    amLODipine-valsartan (EXFORGE) 5-320 MG tablet, Take 1 tablet by mouth daily., Disp: 90 tablet, Rfl: 1  Review of Systems:  Negative unless indicated  in HPI.   Physical Exam: Vitals:   12/19/22 0953  BP: 102/78  Pulse: 74  Temp: 97.7 F (36.5 C)  TempSrc: Oral  SpO2: 99%  Weight: 183 lb 12.8 oz (83.4 kg)  Height: 5' 7.5" (1.715 m)    Body mass index is 28.36 kg/m.   Physical Exam Vitals reviewed.  Constitutional:      General: He is not in acute distress.    Appearance: Normal appearance. He is not ill-appearing, toxic-appearing or diaphoretic.  HENT:     Head: Normocephalic.     Right Ear: Tympanic membrane, ear canal and external ear normal. There is no impacted cerumen.     Left Ear: Tympanic membrane, ear canal and external ear normal. There is no impacted cerumen.     Nose: Nose normal.     Mouth/Throat:     Mouth: Mucous membranes are moist.     Pharynx: Oropharynx is clear. No oropharyngeal exudate or posterior oropharyngeal erythema.  Eyes:     General: No scleral icterus.       Right eye: No discharge.        Left eye: No discharge.     Conjunctiva/sclera: Conjunctivae normal.     Pupils: Pupils are equal, round, and reactive to light.  Neck:     Vascular: No carotid bruit.  Cardiovascular:     Rate and Rhythm: Normal rate and regular rhythm.     Pulses: Normal pulses.     Heart sounds: Normal heart sounds.  Pulmonary:     Effort: Pulmonary effort is normal. No respiratory distress.     Breath sounds: Normal breath sounds.  Abdominal:     General: Abdomen is flat. Bowel sounds are normal.     Palpations: Abdomen is soft.  Musculoskeletal:        General: Normal range of motion.     Cervical back: Normal range of motion.  Skin:    General: Skin is warm and dry.  Neurological:     General: No focal deficit present.     Mental Status: He is alert and oriented to person, place, and time. Mental status is at baseline.  Psychiatric:        Mood and Affect: Mood normal.        Behavior: Behavior normal.        Thought Content: Thought content normal.        Judgment: Judgment normal.      Flowsheet Row Office Visit from 12/19/2022 in [facility] [place]  PHQ-9 Total Score 0        Impression and Plan:  Encounter for preventive health examination -     PSA; Future  Essential hypertension -     CBC with Differential/Platelet; Future -     Comprehensive metabolic panel; Future -  Lipid panel; Future -     TSH; Future -     Vitamin B12; Future -     amLODIPine Besylate-Valsartan; Take 1 tablet by mouth daily.  Dispense: 90 tablet; Refill: 1  Vitamin D deficiency -     VITAMIN D 25 Hydroxy (Vit-D Deficiency, Fractures); Future  Attention deficit hyperactivity disorder (ADHD), combined type  Immunization due  Encounter for hepatitis C screening test for low risk patient -     Hepatitis C antibody; Future  Encounter for screening for HIV -     HIV Antibody (routine testing w rflx); Future   -Recommend routine eye and dental care. -Healthy lifestyle discussed in detail. -Labs to be updated today. -Prostate cancer screening: PSA today Health Maintenance  Topic Date Due   HIV Screening  Never done   Hepatitis C Screening  Never done   Flu Shot  08/15/2022   DTaP/Tdap/Td vaccine (2 - Td or Tdap) 11/04/2024   Colon Cancer Screening  02/03/2027   COVID-19 Vaccine  Completed   HPV Vaccine  Aged Out     -Flu vaccine in office today.    [name], MD [facility]

## 2022-12-20 LAB — HIV ANTIBODY (ROUTINE TESTING W REFLEX): HIV 1&2 Ab, 4th Generation: NONREACTIVE

## 2022-12-20 LAB — HEPATITIS C ANTIBODY: Hepatitis C Ab: NONREACTIVE

## 2022-12-27 DIAGNOSIS — J3081 Allergic rhinitis due to animal (cat) (dog) hair and dander: Secondary | ICD-10-CM | POA: Diagnosis not present

## 2022-12-27 DIAGNOSIS — J3089 Other allergic rhinitis: Secondary | ICD-10-CM | POA: Diagnosis not present

## 2022-12-27 DIAGNOSIS — J301 Allergic rhinitis due to pollen: Secondary | ICD-10-CM | POA: Diagnosis not present

## 2023-01-03 ENCOUNTER — Other Ambulatory Visit: Payer: Self-pay | Admitting: Internal Medicine

## 2023-01-03 DIAGNOSIS — F902 Attention-deficit hyperactivity disorder, combined type: Secondary | ICD-10-CM

## 2023-01-13 MED ORDER — AMPHETAMINE-DEXTROAMPHET ER 20 MG PO CP24: 20.0000 mg | ORAL_CAPSULE | Freq: Every day | ORAL | 0 refills | Status: DC

## 2023-01-13 MED ORDER — AMPHETAMINE-DEXTROAMPHET ER 20 MG PO CP24: 20.0000 mg | ORAL_CAPSULE | Freq: Every morning | ORAL | 0 refills | Status: DC

## 2023-01-23 DIAGNOSIS — J3089 Other allergic rhinitis: Secondary | ICD-10-CM | POA: Diagnosis not present

## 2023-01-23 DIAGNOSIS — J301 Allergic rhinitis due to pollen: Secondary | ICD-10-CM | POA: Diagnosis not present

## 2023-01-23 DIAGNOSIS — J3081 Allergic rhinitis due to animal (cat) (dog) hair and dander: Secondary | ICD-10-CM | POA: Diagnosis not present

## 2023-01-28 DIAGNOSIS — J301 Allergic rhinitis due to pollen: Secondary | ICD-10-CM | POA: Diagnosis not present

## 2023-01-28 DIAGNOSIS — J3081 Allergic rhinitis due to animal (cat) (dog) hair and dander: Secondary | ICD-10-CM | POA: Diagnosis not present

## 2023-01-28 DIAGNOSIS — J3089 Other allergic rhinitis: Secondary | ICD-10-CM | POA: Diagnosis not present

## 2023-02-04 DIAGNOSIS — J3081 Allergic rhinitis due to animal (cat) (dog) hair and dander: Secondary | ICD-10-CM | POA: Diagnosis not present

## 2023-02-04 DIAGNOSIS — J301 Allergic rhinitis due to pollen: Secondary | ICD-10-CM | POA: Diagnosis not present

## 2023-02-04 DIAGNOSIS — J3089 Other allergic rhinitis: Secondary | ICD-10-CM | POA: Diagnosis not present

## 2023-02-11 DIAGNOSIS — J3081 Allergic rhinitis due to animal (cat) (dog) hair and dander: Secondary | ICD-10-CM | POA: Diagnosis not present

## 2023-02-11 DIAGNOSIS — J3089 Other allergic rhinitis: Secondary | ICD-10-CM | POA: Diagnosis not present

## 2023-02-11 DIAGNOSIS — J301 Allergic rhinitis due to pollen: Secondary | ICD-10-CM | POA: Diagnosis not present

## 2023-02-18 DIAGNOSIS — J301 Allergic rhinitis due to pollen: Secondary | ICD-10-CM | POA: Diagnosis not present

## 2023-02-18 DIAGNOSIS — J3089 Other allergic rhinitis: Secondary | ICD-10-CM | POA: Diagnosis not present

## 2023-02-18 DIAGNOSIS — J3081 Allergic rhinitis due to animal (cat) (dog) hair and dander: Secondary | ICD-10-CM | POA: Diagnosis not present

## 2023-02-21 DIAGNOSIS — Z3009 Encounter for other general counseling and advice on contraception: Secondary | ICD-10-CM | POA: Diagnosis not present

## 2023-03-20 DIAGNOSIS — J301 Allergic rhinitis due to pollen: Secondary | ICD-10-CM | POA: Diagnosis not present

## 2023-03-20 DIAGNOSIS — J3081 Allergic rhinitis due to animal (cat) (dog) hair and dander: Secondary | ICD-10-CM | POA: Diagnosis not present

## 2023-03-20 DIAGNOSIS — J3089 Other allergic rhinitis: Secondary | ICD-10-CM | POA: Diagnosis not present

## 2023-04-09 DIAGNOSIS — Z302 Encounter for sterilization: Secondary | ICD-10-CM | POA: Diagnosis not present

## 2023-04-17 DIAGNOSIS — J3089 Other allergic rhinitis: Secondary | ICD-10-CM | POA: Diagnosis not present

## 2023-04-17 DIAGNOSIS — J301 Allergic rhinitis due to pollen: Secondary | ICD-10-CM | POA: Diagnosis not present

## 2023-04-17 DIAGNOSIS — J3081 Allergic rhinitis due to animal (cat) (dog) hair and dander: Secondary | ICD-10-CM | POA: Diagnosis not present

## 2023-05-15 DIAGNOSIS — J3089 Other allergic rhinitis: Secondary | ICD-10-CM | POA: Diagnosis not present

## 2023-05-15 DIAGNOSIS — J3081 Allergic rhinitis due to animal (cat) (dog) hair and dander: Secondary | ICD-10-CM | POA: Diagnosis not present

## 2023-05-15 DIAGNOSIS — J301 Allergic rhinitis due to pollen: Secondary | ICD-10-CM | POA: Diagnosis not present

## 2023-05-19 ENCOUNTER — Other Ambulatory Visit: Payer: Self-pay | Admitting: Internal Medicine

## 2023-05-19 DIAGNOSIS — F902 Attention-deficit hyperactivity disorder, combined type: Secondary | ICD-10-CM

## 2023-05-20 MED ORDER — AMPHETAMINE-DEXTROAMPHET ER 20 MG PO CP24: 20.0000 mg | ORAL_CAPSULE | Freq: Every day | ORAL | 0 refills | Status: DC

## 2023-05-20 MED ORDER — AMPHETAMINE-DEXTROAMPHET ER 20 MG PO CP24: 20.0000 mg | ORAL_CAPSULE | Freq: Every morning | ORAL | 0 refills | Status: DC

## 2023-05-20 NOTE — Telephone Encounter (Signed)
 Medication: Adderall Directions:  Take 1 capsule (20 mg total) by mouth daily.  Last given: 01/13/2023 Number refills: 0 Last o/v: 12/19/2022 Follow up: I don't see in last office visit note. Labs:  12/19/2022  Please review refill request.

## 2023-06-12 DIAGNOSIS — J3089 Other allergic rhinitis: Secondary | ICD-10-CM | POA: Diagnosis not present

## 2023-06-12 DIAGNOSIS — J3081 Allergic rhinitis due to animal (cat) (dog) hair and dander: Secondary | ICD-10-CM | POA: Diagnosis not present

## 2023-06-12 DIAGNOSIS — J301 Allergic rhinitis due to pollen: Secondary | ICD-10-CM | POA: Diagnosis not present

## 2023-07-11 DIAGNOSIS — J4540 Moderate persistent asthma, uncomplicated: Secondary | ICD-10-CM | POA: Diagnosis not present

## 2023-07-11 DIAGNOSIS — J3081 Allergic rhinitis due to animal (cat) (dog) hair and dander: Secondary | ICD-10-CM | POA: Diagnosis not present

## 2023-07-11 DIAGNOSIS — J301 Allergic rhinitis due to pollen: Secondary | ICD-10-CM | POA: Diagnosis not present

## 2023-07-11 DIAGNOSIS — J302 Other seasonal allergic rhinitis: Secondary | ICD-10-CM | POA: Diagnosis not present

## 2023-07-24 DIAGNOSIS — J3089 Other allergic rhinitis: Secondary | ICD-10-CM | POA: Diagnosis not present

## 2023-07-24 DIAGNOSIS — J3081 Allergic rhinitis due to animal (cat) (dog) hair and dander: Secondary | ICD-10-CM | POA: Diagnosis not present

## 2023-07-24 DIAGNOSIS — J301 Allergic rhinitis due to pollen: Secondary | ICD-10-CM | POA: Diagnosis not present

## 2023-08-22 DIAGNOSIS — J3089 Other allergic rhinitis: Secondary | ICD-10-CM | POA: Diagnosis not present

## 2023-08-22 DIAGNOSIS — J3081 Allergic rhinitis due to animal (cat) (dog) hair and dander: Secondary | ICD-10-CM | POA: Diagnosis not present

## 2023-08-22 DIAGNOSIS — J301 Allergic rhinitis due to pollen: Secondary | ICD-10-CM | POA: Diagnosis not present

## 2023-09-18 DIAGNOSIS — J301 Allergic rhinitis due to pollen: Secondary | ICD-10-CM | POA: Diagnosis not present

## 2023-09-18 DIAGNOSIS — J3081 Allergic rhinitis due to animal (cat) (dog) hair and dander: Secondary | ICD-10-CM | POA: Diagnosis not present

## 2023-09-18 DIAGNOSIS — J3089 Other allergic rhinitis: Secondary | ICD-10-CM | POA: Diagnosis not present

## 2023-10-21 ENCOUNTER — Encounter: Payer: Self-pay | Admitting: Internal Medicine

## 2023-10-21 DIAGNOSIS — F902 Attention-deficit hyperactivity disorder, combined type: Secondary | ICD-10-CM

## 2023-10-21 MED ORDER — AMPHETAMINE-DEXTROAMPHET ER 20 MG PO CP24: 20.0000 mg | ORAL_CAPSULE | Freq: Every day | ORAL | 0 refills | Status: DC

## 2023-10-23 DIAGNOSIS — J301 Allergic rhinitis due to pollen: Secondary | ICD-10-CM | POA: Diagnosis not present

## 2023-10-23 DIAGNOSIS — J3081 Allergic rhinitis due to animal (cat) (dog) hair and dander: Secondary | ICD-10-CM | POA: Diagnosis not present

## 2023-10-23 DIAGNOSIS — J3089 Other allergic rhinitis: Secondary | ICD-10-CM | POA: Diagnosis not present

## 2023-11-21 DIAGNOSIS — J3081 Allergic rhinitis due to animal (cat) (dog) hair and dander: Secondary | ICD-10-CM | POA: Diagnosis not present

## 2023-11-21 DIAGNOSIS — J3089 Other allergic rhinitis: Secondary | ICD-10-CM | POA: Diagnosis not present

## 2023-11-21 DIAGNOSIS — J301 Allergic rhinitis due to pollen: Secondary | ICD-10-CM | POA: Diagnosis not present

## 2023-11-26 DIAGNOSIS — J3081 Allergic rhinitis due to animal (cat) (dog) hair and dander: Secondary | ICD-10-CM | POA: Diagnosis not present

## 2023-11-26 DIAGNOSIS — J301 Allergic rhinitis due to pollen: Secondary | ICD-10-CM | POA: Diagnosis not present

## 2023-11-27 DIAGNOSIS — J302 Other seasonal allergic rhinitis: Secondary | ICD-10-CM | POA: Diagnosis not present

## 2023-12-01 ENCOUNTER — Telehealth (INDEPENDENT_AMBULATORY_CARE_PROVIDER_SITE_OTHER): Admitting: Internal Medicine

## 2023-12-01 ENCOUNTER — Encounter: Payer: Self-pay | Admitting: Internal Medicine

## 2023-12-01 DIAGNOSIS — F902 Attention-deficit hyperactivity disorder, combined type: Secondary | ICD-10-CM

## 2023-12-01 DIAGNOSIS — I10 Essential (primary) hypertension: Secondary | ICD-10-CM | POA: Diagnosis not present

## 2023-12-01 MED ORDER — AMLODIPINE BESYLATE-VALSARTAN 5-320 MG PO TABS
1.0000 | ORAL_TABLET | Freq: Every day | ORAL | 1 refills | Status: AC
Start: 2023-12-01 — End: ?

## 2023-12-01 MED ORDER — AMPHETAMINE-DEXTROAMPHET ER 20 MG PO CP24: 20.0000 mg | ORAL_CAPSULE | Freq: Every morning | ORAL | 0 refills | Status: AC

## 2023-12-01 MED ORDER — AMPHETAMINE-DEXTROAMPHET ER 20 MG PO CP24: 20.0000 mg | ORAL_CAPSULE | Freq: Every day | ORAL | 0 refills | Status: AC

## 2023-12-01 NOTE — Progress Notes (Signed)
 Per patient no change in vitals since last visit, unable to obtain new vitals due to telehealth visit.

## 2023-12-01 NOTE — Progress Notes (Signed)
 Virtual Visit via Video Note  I connected with Adam Terry on 12/01/23 at  4:00 PM EST by a video enabled telemedicine application and verified that I am speaking with the correct person using two identifiers.  Location patient: home Location provider: work office Persons participating in the virtual visit: patient, provider  I discussed the limitations of evaluation and management by telemedicine and the availability of in person appointments. The patient expressed understanding and agreed to proceed.   HPI: This visit is scheduled for Adderall refills.  He is doing well on his current doses.  Has no concerns or questions.   ROS: Negative unless indicated in HPI.  Past Medical History:  Diagnosis Date   ADHD (attention deficit hyperactivity disorder)    ALLERGIC RHINITIS 09/17/2007   Allergy    Asthma    Cataract    forming    Hypertension    NEPHROLITHIASIS, HX OF 10/20/2007   PONV (postoperative nausea and vomiting)    with anesthesia gas    STONE, URINARY CALCULUS,UNSPEC. 09/17/2007    Past Surgical History:  Procedure Laterality Date   EXTRACORPOREAL SHOCK WAVE LITHOTRIPSY Left 02/25/2022   Procedure: LEFT EXTRACORPOREAL SHOCK WAVE LITHOTRIPSY (ESWL);  Surgeon: Selma Donnice SAUNDERS, MD;  Location: [facility];  Service: Urology;  Laterality: Left;  75 MINS   patch to ear drum     TONSILLECTOMY     TYMPANOSTOMY TUBE PLACEMENT     x 13     Family History  Problem Relation Age of Onset   Urolithiasis Mother    Leukemia Maternal Uncle    Heart disease Maternal Grandmother    Breast cancer Maternal Grandmother    Lung cancer Maternal Grandmother    Colon cancer Neg Hx    Colon polyps Neg Hx    Esophageal cancer Neg Hx    Stomach cancer Neg Hx    Rectal cancer Neg Hx     SOCIAL HX:   reports that he has never smoked. He has never used smokeless tobacco. He reports current alcohol use. He reports that he does not use drugs.   Current  Outpatient Medications:    albuterol  (PROVENTIL  HFA;VENTOLIN  HFA) 108 (90 Base) MCG/ACT inhaler, Inhale 2 puffs into the lungs every 6 (six) hours as needed for wheezing or shortness of breath., Disp: 1 Inhaler, Rfl: 0   aspirin-acetaminophen -caffeine (EXCEDRIN MIGRAINE) 250-250-65 MG per tablet, Take 1 tablet by mouth every 6 (six) hours as needed for headache., Disp: , Rfl:    azelastine  (ASTELIN ) 0.1 % nasal spray, Place 2 sprays into both nostrils 2 (two) times daily. Use in each nostril as directed, Disp: 30 mL, Rfl: 12   budesonide -formoterol  (SYMBICORT ) 160-4.5 MCG/ACT inhaler, Inhale 2 puffs into the lungs 2 (two) times daily., Disp: 1 Inhaler, Rfl: 3   EPINEPHrine 0.3 mg/0.3 mL IJ SOAJ injection, See admin instructions., Disp: , Rfl:    fluticasone  (FLONASE  SENSIMIST) 27.5 MCG/SPRAY nasal spray, 2 sprays each nostril, Disp: , Rfl:    ketotifen (ZADITOR) 0.025 % ophthalmic solution, 1 drop into affected eye, Disp: , Rfl:    levocetirizine (XYZAL ) 5 MG tablet, Take 1 tablet (5 mg total) by mouth daily., Disp: 60 tablet, Rfl: 4   montelukast  (SINGULAIR ) 10 MG tablet, Take 1 tablet (10 mg total) by mouth at bedtime., Disp: 30 tablet, Rfl: 6   Spacer/Aero-Holding Chambers (AEROCHAMBER MAX W/FLOW-VU) MISC, See admin instructions., Disp: , Rfl:    amLODipine -valsartan  (EXFORGE ) 5-320 MG tablet, Take 1 tablet  by mouth daily., Disp: 90 tablet, Rfl: 1   amphetamine -dextroamphetamine (ADDERALL XR) 20 MG 24 hr capsule, Take 1 capsule (20 mg total) by mouth daily., Disp: 30 capsule, Rfl: 0   amphetamine -dextroamphetamine (ADDERALL XR) 20 MG 24 hr capsule, Take 1 capsule (20 mg total) by mouth every morning., Disp: 30 capsule, Rfl: 0   amphetamine -dextroamphetamine (ADDERALL XR) 20 MG 24 hr capsule, Take 1 capsule (20 mg total) by mouth daily., Disp: 30 capsule, Rfl: 0  EXAM:   VITALS per patient if applicable: None reported  GENERAL: alert, oriented, appears well and in no acute distress  HEENT:  atraumatic, conjunttiva clear, no obvious abnormalities on inspection of external nose and ears  NECK: normal movements of the head and neck  LUNGS: on inspection no signs of respiratory distress, breathing rate appears normal, no obvious gross increased work of breathing, gasping or wheezing  CV: no obvious cyanosis  MS: moves all visible extremities without noticeable abnormality  PSYCH/NEURO: pleasant and cooperative, no obvious depression or anxiety, speech and thought processing grossly intact  ASSESSMENT AND PLAN:   Attention deficit hyperactivity disorder (ADHD), combined type - Plan: amphetamine -dextroamphetamine (ADDERALL XR) 20 MG 24 hr capsule, amphetamine -dextroamphetamine (ADDERALL XR) 20 MG 24 hr capsule, amphetamine -dextroamphetamine (ADDERALL XR) 20 MG 24 hr capsule  Essential hypertension - Plan: amLODipine -valsartan  (EXFORGE ) 5-320 MG tablet  -PDMP reviewed, no red flags, overdose rescore is 330.  Filled Adderall XR 20 mg to take 1 tablet daily for total of 30 tablets a month x 3 months.  Will also refill his Exforge  for blood pressure.   I discussed the assessment and treatment plan with the patient. The patient was provided an opportunity to ask questions and all were answered. The patient agreed with the plan and demonstrated an understanding of the instructions.   The patient was advised to call back or seek an in-person evaluation if the symptoms worsen or if the condition fails to improve as anticipated.    Adam Theophilus Andrews, MD  [facility] at [facility]

## 2024-03-18 ENCOUNTER — Encounter: Admitting: Internal Medicine
